# Patient Record
Sex: Female | Born: 1960 | Race: Black or African American | Hispanic: No | Marital: Single | Smoking: Current every day smoker
Health system: Southern US, Community
[De-identification: ages and names within clinical notes are randomized; demographics above are authoritative.]

## PROBLEM LIST (undated history)

## (undated) DIAGNOSIS — R7303 Prediabetes: Secondary | ICD-10-CM

## (undated) DIAGNOSIS — D649 Anemia, unspecified: Secondary | ICD-10-CM

## (undated) DIAGNOSIS — N189 Chronic kidney disease, unspecified: Secondary | ICD-10-CM

## (undated) DIAGNOSIS — E785 Hyperlipidemia, unspecified: Secondary | ICD-10-CM

## (undated) DIAGNOSIS — I10 Essential (primary) hypertension: Secondary | ICD-10-CM

## (undated) DIAGNOSIS — I739 Peripheral vascular disease, unspecified: Secondary | ICD-10-CM

## (undated) DIAGNOSIS — R519 Headache, unspecified: Secondary | ICD-10-CM

## (undated) HISTORY — DX: Hyperlipidemia, unspecified: E78.5

## (undated) HISTORY — DX: Essential (primary) hypertension: I10

## (undated) HISTORY — PX: BREAST EXCISIONAL BIOPSY: SUR124

## (undated) HISTORY — DX: Anemia, unspecified: D64.9

## (undated) HISTORY — DX: Peripheral vascular disease, unspecified: I73.9

## (undated) HISTORY — PX: COLONOSCOPY: SHX174

---

## 2018-04-10 ENCOUNTER — Emergency Department (HOSPITAL_COMMUNITY)
Admission: EM | Admit: 2018-04-10 | Discharge: 2018-04-10 | Disposition: A | Discharge status: Home / Self Care | Payer: Medicaid Other | Attending: Emergency Medicine | Admitting: Emergency Medicine

## 2018-04-10 ENCOUNTER — Other Ambulatory Visit: Payer: Self-pay

## 2018-04-10 ENCOUNTER — Encounter (HOSPITAL_COMMUNITY): Payer: Self-pay

## 2018-04-10 DIAGNOSIS — I739 Peripheral vascular disease, unspecified: Secondary | ICD-10-CM | POA: Diagnosis not present

## 2018-04-10 DIAGNOSIS — F17200 Nicotine dependence, unspecified, uncomplicated: Secondary | ICD-10-CM | POA: Insufficient documentation

## 2018-04-10 DIAGNOSIS — M79605 Pain in left leg: Secondary | ICD-10-CM | POA: Diagnosis present

## 2018-04-10 DIAGNOSIS — I10 Essential (primary) hypertension: Secondary | ICD-10-CM | POA: Diagnosis not present

## 2018-04-10 LAB — CBC
HEMATOCRIT: 43.7 % (ref 36.0–46.0)
Hemoglobin: 13.9 g/dL (ref 12.0–15.0)
MCH: 31.1 pg (ref 26.0–34.0)
MCHC: 31.8 g/dL (ref 30.0–36.0)
MCV: 97.8 fL (ref 80.0–100.0)
Platelets: 232 10*3/uL (ref 150–400)
RBC: 4.47 MIL/uL (ref 3.87–5.11)
RDW: 13.7 % (ref 11.5–15.5)
WBC: 6 10*3/uL (ref 4.0–10.5)
nRBC: 0 % (ref 0.0–0.2)

## 2018-04-10 LAB — BASIC METABOLIC PANEL
Anion gap: 7 (ref 5–15)
BUN: 10 mg/dL (ref 6–20)
CO2: 29 mmol/L (ref 22–32)
Calcium: 9.4 mg/dL (ref 8.9–10.3)
Chloride: 103 mmol/L (ref 98–111)
Creatinine, Ser: 0.82 mg/dL (ref 0.44–1.00)
GFR calc Af Amer: >60 mL/min (ref >60)
GFR calc non Af Amer: >60 mL/min (ref >60)
Glucose, Bld: 97 mg/dL (ref 70–99)
Potassium: 4.2 mmol/L (ref 3.5–5.1)
Sodium: 139 mmol/L (ref 135–145)

## 2018-04-10 MED ORDER — AMLODIPINE BESYLATE 10 MG PO TABS: 10.0000 mg | ORAL_TABLET | Freq: Every day | ORAL | 1 refills | Status: DC

## 2018-04-10 MED ORDER — CILOSTAZOL 100 MG PO TABS: 100.0000 mg | ORAL_TABLET | Freq: Two times a day (BID) | ORAL | 1 refills | Status: DC

## 2018-04-10 NOTE — Discharge Instructions (Addendum)
The pain in your calf with walking is consistent with claudication.  And as you were told in the past you knew that you had some narrowing in the femoral iliac area on the left leg.  Make an appointment now that you are in this area to follow-up with vascular surgery here.  Return for any pain that is persistent at rest.  Or for any new or worse symptoms.  We have renewed your medications.  Take as directed.  Also given referral to wellness clinic where you can have your blood pressure followed up.

## 2018-04-10 NOTE — ED Notes (Signed)
Patient verbalizes understanding of discharge instructions. Opportunity for questioning and answers were provided. Armband removed by staff, pt discharged from ED.  

## 2018-04-10 NOTE — ED Triage Notes (Signed)
Pt states left leg pain X4 months. States she the pain is significantly worse after being on the leg working. AMbulatory.

## 2018-04-10 NOTE — ED Notes (Signed)
Pt ambulated to restroom with no assistance. ?

## 2018-04-10 NOTE — ED Provider Notes (Signed)
[facility] EMERGENCY DEPARTMENT Provider Note   CSN: [id number] Arrival date & time: 04/10/18  1112    History   Chief Complaint Chief Complaint  Patient presents with  . Leg Pain    HPI Brooke Chapman is a 58 y.o. female.     Patient recently moved here from the [place] area about 4 months ago.  Patient's been experiencing increased pain in her left calf with walking.  Has no rest pain.  Patient was told up in the [place] area a while back that she has a narrowing of the blood flow into her left leg up in the groin area.  Patient states the pain always resolves with rest.  No chest pain no shortness of breath.     History reviewed. No pertinent past medical history.  There are no active problems to display for this patient.   History reviewed. No pertinent surgical history.   OB History   No obstetric history on file.      Home Medications    Prior to Admission medications   Medication Sig Start Date End Date Taking? Authorizing Provider  amLODipine (NORVASC) 10 MG tablet Take 1 tablet (10 mg total) by mouth daily. 04/10/18   Fredia Sorrow, MD  cilostazol (PLETAL) 100 MG tablet Take 1 tablet (100 mg total) by mouth 2 (two) times daily. 04/10/18   Fredia Sorrow, MD    Family History History reviewed. No pertinent family history.  Social History Social History   Tobacco Use  . Smoking status: Current Every Day Smoker    Packs/day: 1.00  . Smokeless tobacco: Never Used  Substance Use Topics  . Alcohol use: Yes    Comment: rare  . Drug use: Never     Allergies   Patient has no known allergies.   Review of Systems Review of Systems  Constitutional: Negative for chills and fever.  HENT: Negative for rhinorrhea and sore throat.   Eyes: Negative for visual disturbance.  Respiratory: Negative for cough and shortness of breath.   Cardiovascular: Negative for chest pain and leg swelling.  Gastrointestinal: Negative for abdominal  pain, diarrhea, nausea and vomiting.  Genitourinary: Negative for dysuria.  Musculoskeletal: Negative for back pain and neck pain.  Skin: Negative for rash and wound.  Neurological: Negative for dizziness, light-headedness and headaches.  Hematological: Does not bruise/bleed easily.  Psychiatric/Behavioral: Negative for confusion.     Physical Exam Updated Vital Signs BP (!) 139/100   Pulse 76   Temp 98.4 F (36.9 C) (Oral)   Resp 17   SpO2 100%   Physical Exam Vitals signs and nursing note reviewed.  Constitutional:      General: She is not in acute distress.    Appearance: She is well-developed.  HENT:     Head: Normocephalic and atraumatic.     Mouth/Throat:     Mouth: Mucous membranes are moist.  Eyes:     Extraocular Movements: Extraocular movements intact.     Conjunctiva/sclera: Conjunctivae normal.     Pupils: Pupils are equal, round, and reactive to light.  Neck:     Musculoskeletal: Normal range of motion and neck supple.  Cardiovascular:     Rate and Rhythm: Normal rate and regular rhythm.     Heart sounds: No murmur.  Pulmonary:     Effort: Pulmonary effort is normal. No respiratory distress.     Breath sounds: Normal breath sounds.  Abdominal:     General: Bowel sounds are normal.  Palpations: Abdomen is soft.     Tenderness: There is no abdominal tenderness.  Musculoskeletal:        General: No swelling or tenderness.     Right lower leg: No edema.     Left lower leg: No edema.     Comments: Normal pulse on the left side is 2+.  Dorsalis pedis pulse and posterior tib pulse weak at about 1+.  But palpable.  Good cap refill of the toes.  Foot is warm.  No evidence of any ischemia to the toes.  Leg is warm as well.  Skin:    General: Skin is warm and dry.     Capillary Refill: Capillary refill takes less than 2 seconds.  Neurological:     General: No focal deficit present.     Mental Status: She is alert and oriented to person, place, and time.       ED Treatments / Results  Labs (all labs ordered are listed, but only abnormal results are displayed) Labs Reviewed  BASIC METABOLIC PANEL  CBC   Results for orders placed or performed during the hospital encounter of 16/01/09  Basic metabolic panel  Result Value Ref Range   Sodium 139 135 - 145 mmol/L   Potassium 4.2 3.5 - 5.1 mmol/L   Chloride 103 98 - 111 mmol/L   CO2 29 22 - 32 mmol/L   Glucose, Bld 97 70 - 99 mg/dL   BUN 10 6 - 20 mg/dL   Creatinine, Ser 0.82 0.44 - 1.00 mg/dL   Calcium 9.4 8.9 - 10.3 mg/dL   GFR calc non Af Amer >60 >60 mL/min   GFR calc Af Amer >60 >60 mL/min   Anion gap 7 5 - 15  CBC  Result Value Ref Range   WBC 6.0 4.0 - 10.5 K/uL   RBC 4.47 3.87 - 5.11 MIL/uL   Hemoglobin 13.9 12.0 - 15.0 g/dL   HCT 43.7 36.0 - 46.0 %   MCV 97.8 80.0 - 100.0 fL   MCH 31.1 26.0 - 34.0 pg   MCHC 31.8 30.0 - 36.0 g/dL   RDW 13.7 11.5 - 15.5 %   Platelets 232 150 - 400 K/uL   nRBC 0.0 0.0 - 0.2 %    EKG None  Radiology No results found.  Procedures Procedures (including critical care time)  Medications Ordered in ED Medications - No data to display   Initial Impression / Assessment and Plan / ED Course  I have reviewed the triage vital signs and the nursing notes.  Pertinent labs & imaging results that were available during my care of the patient were reviewed by me and considered in my medical decision making (see chart for details).       Patient symptoms in the left calf with walking are consistent with claudication.  Patient with a good strong femoral pulse here.  Week dorsalis pedis pulse.  Good cap refill to toes.  Foot is warm.  No significant swelling in the calf.  No pain at rest.  Pain always resolves when she is able to rest the leg.  Patient is from the [place] area has been down here about 4 months.  Needs to be plugged into the vascular clinic here.  Patient also has hypertension has not been on medications for that normally she is  on amlodipine.  We will renew her antiplatelet medication as well as her hypertensive meds.  Will refer her to the wellness clinic in the meantime for  blood pressure control.  Patient here today nontoxic no acute distress.  Final Clinical Impressions(s) / ED Diagnoses   Final diagnoses:  Claudication [facility])  Essential hypertension    ED Discharge Orders         Ordered    cilostazol (PLETAL) 100 MG tablet  2 times daily     04/10/18 1517    amLODipine (NORVASC) 10 MG tablet  Daily     04/10/18 1517           Fredia Sorrow, MD 04/10/18 1527

## 2018-05-01 ENCOUNTER — Ambulatory Visit: Payer: Medicaid Other | Attending: Critical Care Medicine | Admitting: Critical Care Medicine

## 2018-05-01 ENCOUNTER — Other Ambulatory Visit: Payer: Self-pay

## 2018-05-01 ENCOUNTER — Encounter: Payer: Self-pay | Admitting: Critical Care Medicine

## 2018-05-01 VITALS — BP 137/91 | HR 107 | Temp 98.1°F | Wt 148.0 lb

## 2018-05-01 DIAGNOSIS — Z1239 Encounter for other screening for malignant neoplasm of breast: Secondary | ICD-10-CM | POA: Diagnosis not present

## 2018-05-01 DIAGNOSIS — F17210 Nicotine dependence, cigarettes, uncomplicated: Secondary | ICD-10-CM | POA: Diagnosis not present

## 2018-05-01 DIAGNOSIS — I10 Essential (primary) hypertension: Secondary | ICD-10-CM

## 2018-05-01 DIAGNOSIS — I739 Peripheral vascular disease, unspecified: Secondary | ICD-10-CM | POA: Diagnosis not present

## 2018-05-01 DIAGNOSIS — F17200 Nicotine dependence, unspecified, uncomplicated: Secondary | ICD-10-CM

## 2018-05-01 DIAGNOSIS — Z1159 Encounter for screening for other viral diseases: Secondary | ICD-10-CM | POA: Diagnosis not present

## 2018-05-01 HISTORY — DX: Nicotine dependence, unspecified, uncomplicated: F17.200

## 2018-05-01 HISTORY — DX: Essential (primary) hypertension: I10

## 2018-05-01 MED ORDER — AMLODIPINE BESYLATE 10 MG PO TABS: 10.0000 mg | ORAL_TABLET | Freq: Every day | ORAL | 1 refills | Status: DC

## 2018-05-01 MED ORDER — BUPROPION HCL ER (SR) 150 MG PO TB12: 150.0000 mg | ORAL_TABLET | Freq: Two times a day (BID) | ORAL | 1 refills | Status: DC

## 2018-05-01 MED ORDER — CILOSTAZOL 100 MG PO TABS: 100.0000 mg | ORAL_TABLET | Freq: Two times a day (BID) | ORAL | 1 refills | Status: DC

## 2018-05-01 NOTE — Assessment & Plan Note (Signed)
Hypertension that is reasonably well-controlled at this time  Will obtain a thyroid panel, complete metabolic panel and complete blood count  Note EKG at this visit was normal

## 2018-05-01 NOTE — Assessment & Plan Note (Signed)
Peripheral artery disease with previous work-up in [place] in [place] will be to obtain records from the patient's previous vascular surgeon and refer to our vascular surgeons for further evaluation  I spent considerable amount of time on smoking cessation at this visit

## 2018-05-01 NOTE — Assessment & Plan Note (Addendum)
10 minutes of time were spent on smoking cessation counseling, and I recommended onset of bupropion 150 mg twice daily to assist with the smoking cessation counseling

## 2018-05-01 NOTE — Progress Notes (Signed)
Subjective:    Patient ID: Brooke Chapman, female    DOB: 09-08-1960, 58 y.o.   MRN: [id number]  This is a 58 year old female referred from the emergency room and is a new patient to the practice.  The patient's had claudication in the left leg for 10 years.  It is getting progressively worse.  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  She apparently had vascular studies done and was last seen 6 to 8 months ago and had obstruction in the upper and lower circulation of the left leg.  We will obtain records from her vascular surgeon.  Since arriving in [place] she has not yet established with a primary care provider  There is a history of hypertension but no history of diabetes.  She had a negative HIV study in 2017.  She had a colonoscopy done in 2019.  Her Pap smear was 2019.  She is due a tetanus vaccine and mammogram and hepatitis C study   name of MD.Dr Ian Bushman  [phone]   [address]  [address]  [place] [address] She complains of some pain under the right arm but there is no chest pain she occasionally has headaches as well.  There is no shortness of breath.  No known history of coronary disease.  The patient does continue to smoke 1 pack a day of cigarettes  Past Medical History:  Diagnosis Date  . Hypertension   . PAD (peripheral artery disease) (HCC)      No family history on file.   Social History   Socioeconomic History  . Marital status: Single    Spouse name: Not on file  . Number of children: Not on file  . Years of education: Not on file  . Highest education level: Not on file  Occupational History  . Not on file  Social Needs  . Financial resource strain: Not on file  . Food insecurity:    Worry: Not on file    Inability: Not on file  . Transportation needs:    Medical: Not on file    Non-medical: Not on file  Tobacco Use  . Smoking status: Current Every Day  Smoker    Packs/day: 1.00  . Smokeless tobacco: Never Used  Substance and Sexual Activity  . Alcohol use: Yes    Comment: rare  . Drug use: Never  . Sexual activity: Not on file  Lifestyle  . Physical activity:    Days per week: Not on file    Minutes per session: Not on file  . Stress: Not on file  Relationships  . Social connections:    Talks on phone: Not on file    Gets together: Not on file    Attends religious service: Not on file    Active member of club or organization: Not on file    Attends meetings of clubs or organizations: Not on file    Relationship status: Not on file  . Intimate partner violence:    Fear of current or ex partner: Not on file    Emotionally abused: Not on file    Physically abused: Not on file    Forced sexual activity: Not on file  Other Topics Concern  . Not on file  Social History Narrative  . Not on file     No Known Allergies   Outpatient Medications Prior to  Visit  Medication Sig Dispense Refill  . aspirin 81 MG chewable tablet Chew 81 mg by mouth daily.    . cholecalciferol (VITAMIN D3) 25 MCG (1000 UT) tablet Take 1,000 Units by mouth daily.    Marland Kitchen amLODipine (NORVASC) 10 MG tablet Take 1 tablet (10 mg total) by mouth daily. 30 tablet 1  . cilostazol (PLETAL) 100 MG tablet Take 1 tablet (100 mg total) by mouth 2 (two) times daily. 60 tablet 1   No facility-administered medications prior to visit.      Review of Systems     Objective:   Physical Exam Vitals:   05/01/18 1443  BP: (!) 137/91  Pulse: (!) 107  Temp: 98.1 F (36.7 C)  TempSrc: Oral  SpO2: 98%  Weight: 148 lb (67.1 kg)    Gen: Pleasant, well-nourished, in no distress,  normal affect  ENT: No lesions,  mouth clear,  oropharynx clear, no postnasal drip  Neck: No JVD, no TMG, no carotid bruits  Lungs: No use of accessory muscles, no dullness to percussion, clear without rales or rhonchi  Cardiovascular: RRR, heart sounds normal, no murmur or gallops, no  peripheral edema, there is decreased pulses in the left lower extremity  Abdomen: soft and NT, no HSM,  BS normal  Musculoskeletal: No deformities, no cyanosis or clubbing  Neuro: alert, non focal  Skin: Warm, no lesions or rashes      Assessment & Plan:  I personally reviewed all images and lab data in the [facility] system as well as any outside material available during this office visit and agree with the  radiology impressions.   No problem-specific Assessment & Plan notes found for this encounter.   Zonie was seen today for hospitalization follow-up.  Diagnoses and all orders for this visit:  PAD (peripheral artery disease) ([facility]) -     Ambulatory referral to Vascular Surgery -     CBC with Differential/Platelet; Future -     Comprehensive metabolic panel -     CBC with Differential/Platelet  Essential hypertension -     CBC with Differential/Platelet; Future -     Comprehensive metabolic panel -     Thyroid Profile -     Lipid panel -     CBC with Differential/Platelet  Need for hepatitis C screening test -     Hepatitis C antibody  Breast cancer screening -     MM DIGITAL SCREENING BILATERAL; Future  Tobacco dependence  Other orders -     cilostazol (PLETAL) 100 MG tablet; Take 1 tablet (100 mg total) by mouth 2 (two) times daily. -     amLODipine (NORVASC) 10 MG tablet; Take 1 tablet (10 mg total) by mouth daily. -     buPROPion (WELLBUTRIN SR) 150 MG 12 hr tablet; Take 1 tablet (150 mg total) by mouth 2 (two) times daily.   I spent 10 minutes of this visit was smoking cessation counseling  Also the patient was given a tetanus vaccine and a mammogram will be scheduled  Also a hepatitis C study will be obtained

## 2018-05-01 NOTE — Patient Instructions (Addendum)
We will obtain your outpatient records from your vascular surgeon in [place] on your blood pressure medicine and Pletal will be mailed to your home without change in dose  I recommend beginning bupropion or Wellbutrin 1 tablet twice daily to pursue smoking cessation  Labs today will include a complete metabolic panel, blood count, lipid panel, thyroid panel, hepatitis C panel  We will also have you scheduled for mammogram  A tetanus vaccine was given today in the office  I will schedule a telephone visit with you in 1 month

## 2018-05-02 ENCOUNTER — Telehealth: Payer: Self-pay | Admitting: Critical Care Medicine

## 2018-05-02 DIAGNOSIS — E785 Hyperlipidemia, unspecified: Secondary | ICD-10-CM | POA: Insufficient documentation

## 2018-05-02 DIAGNOSIS — E7800 Pure hypercholesterolemia, unspecified: Secondary | ICD-10-CM

## 2018-05-02 LAB — COMPREHENSIVE METABOLIC PANEL
ALT: 10 IU/L (ref 0–32)
AST: 22 IU/L (ref 0–40)
Albumin/Globulin Ratio: 1.4 (ref 1.2–2.2)
Albumin: 4.3 g/dL (ref 3.8–4.9)
Alkaline Phosphatase: 71 IU/L (ref 39–117)
BUN/Creatinine Ratio: 16 (ref 12–28)
BUN: 13 mg/dL (ref 8–27)
Bilirubin Total: 0.2 mg/dL (ref 0.0–1.2)
CO2: 25 mmol/L (ref 20–29)
Calcium: 9.6 mg/dL (ref 8.7–10.3)
Chloride: 101 mmol/L (ref 96–106)
Creatinine, Ser: 0.81 mg/dL (ref 0.57–1.00)
GFR calc Af Amer: 91 mL/min/{1.73_m2} (ref >59)
GFR calc non Af Amer: 79 mL/min/{1.73_m2} (ref >59)
Globulin, Total: 3.1 g/dL (ref 1.5–4.5)
Glucose: 84 mg/dL (ref 65–99)
Potassium: 4.1 mmol/L (ref 3.5–5.2)
Sodium: 143 mmol/L (ref 134–144)
Total Protein: 7.4 g/dL (ref 6.0–8.5)

## 2018-05-02 LAB — LIPID PANEL
Chol/HDL Ratio: 3.5 ratio (ref 0.0–4.4)
Cholesterol, Total: 230 mg/dL — ABNORMAL HIGH (ref 100–199)
HDL: 66 mg/dL (ref >39)
LDL Calculated: 148 mg/dL — ABNORMAL HIGH (ref 0–99)
Triglycerides: 79 mg/dL (ref 0–149)
VLDL Cholesterol Cal: 16 mg/dL (ref 5–40)

## 2018-05-02 LAB — CBC WITH DIFFERENTIAL/PLATELET
Basophils Absolute: 0 10*3/uL (ref 0.0–0.2)
Basos: 1 %
EOS (ABSOLUTE): 0 10*3/uL (ref 0.0–0.4)
Eos: 0 %
Hematocrit: 38.7 % (ref 34.0–46.6)
Hemoglobin: 13.7 g/dL (ref 11.1–15.9)
Immature Grans (Abs): 0 10*3/uL (ref 0.0–0.1)
Immature Granulocytes: 0 %
Lymphocytes Absolute: 3.6 10*3/uL — ABNORMAL HIGH (ref 0.7–3.1)
Lymphs: 55 %
MCH: 31.8 pg (ref 26.6–33.0)
MCHC: 35.4 g/dL (ref 31.5–35.7)
MCV: 90 fL (ref 79–97)
Monocytes Absolute: 0.4 10*3/uL (ref 0.1–0.9)
Monocytes: 6 %
Neutrophils Absolute: 2.5 10*3/uL (ref 1.4–7.0)
Neutrophils: 38 %
Platelets: 293 10*3/uL (ref 150–450)
RBC: 4.31 x10E6/uL (ref 3.77–5.28)
RDW: 12.8 % (ref 11.7–15.4)
WBC: 6.5 10*3/uL (ref 3.4–10.8)

## 2018-05-02 LAB — THYROID PANEL
Free Thyroxine Index: 2.1 (ref 1.2–4.9)
T3 Uptake Ratio: 26 % (ref 24–39)
T4, Total: 8.1 ug/dL (ref 4.5–12.0)

## 2018-05-02 LAB — HEPATITIS C ANTIBODY: Hep C Virus Ab: <0.1 s/co ratio (ref 0.0–0.9)

## 2018-05-02 MED ORDER — ATORVASTATIN CALCIUM 20 MG PO TABS: 20.0000 mg | ORAL_TABLET | Freq: Every day | ORAL | 3 refills | Status: DC

## 2018-05-02 NOTE — Telephone Encounter (Signed)
Pt aware of results  Needs atorvastin for HLP.

## 2018-05-07 MED FILL — BUPROPION SR 150 MG TABLET: 150 | 30 days supply | Qty: 60 | Fill #0 | Status: TO

## 2018-05-15 ENCOUNTER — Ambulatory Visit: Payer: Self-pay | Admitting: Critical Care Medicine

## 2018-07-07 ENCOUNTER — Other Ambulatory Visit: Payer: Self-pay

## 2018-07-07 ENCOUNTER — Ambulatory Visit
Admission: RE | Admit: 2018-07-07 | Discharge: 2018-07-07 | Disposition: A | Discharge status: Home / Self Care | Payer: Medicaid Other | Source: Ambulatory Visit | Attending: Critical Care Medicine | Admitting: Critical Care Medicine

## 2018-07-07 DIAGNOSIS — Z1239 Encounter for other screening for malignant neoplasm of breast: Secondary | ICD-10-CM

## 2018-07-09 ENCOUNTER — Other Ambulatory Visit: Payer: Self-pay

## 2018-07-09 DIAGNOSIS — I739 Peripheral vascular disease, unspecified: Secondary | ICD-10-CM

## 2018-07-10 ENCOUNTER — Telehealth: Payer: Self-pay | Admitting: *Deleted

## 2018-07-10 NOTE — Telephone Encounter (Signed)
Left message on voice mail  to call back

## 2018-07-10 NOTE — Telephone Encounter (Signed)
-----   Message from Elsie Stain, MD sent at 07/08/2018  8:28 AM EDT ----- Carilyn Goodpasture,  Can you let ms Wynetta Emery know her mammogram was normal  No cancer.  Will arrange repeat exam in ONE year

## 2018-07-18 ENCOUNTER — Other Ambulatory Visit: Payer: Self-pay

## 2018-07-18 ENCOUNTER — Encounter: Payer: Self-pay | Admitting: Vascular Surgery

## 2018-07-18 ENCOUNTER — Ambulatory Visit (INDEPENDENT_AMBULATORY_CARE_PROVIDER_SITE_OTHER): Payer: Medicaid Other | Admitting: Vascular Surgery

## 2018-07-18 ENCOUNTER — Ambulatory Visit (HOSPITAL_COMMUNITY)
Admission: RE | Admit: 2018-07-18 | Discharge: 2018-07-18 | Disposition: A | Discharge status: Home / Self Care | Payer: Medicaid Other | Source: Ambulatory Visit | Attending: Vascular Surgery | Admitting: Vascular Surgery

## 2018-07-18 VITALS — BP 148/92 | HR 94 | Temp 97.8°F | Resp 20 | Ht 64.0 in | Wt 166.0 lb

## 2018-07-18 DIAGNOSIS — Z7689 Persons encountering health services in other specified circumstances: Secondary | ICD-10-CM | POA: Diagnosis not present

## 2018-07-18 DIAGNOSIS — I739 Peripheral vascular disease, unspecified: Secondary | ICD-10-CM | POA: Insufficient documentation

## 2018-07-18 NOTE — Progress Notes (Signed)
Patient ID: Brooke Chapman, female   DOB: January 07, 1961, 58 y.o.   MRN: [id number]  Reason for Consult: New Patient (Initial Visit)   Referred by Elsie Stain, MD  Subjective:     HPI:  Brooke Chapman is a 58 y.o. female with possible previous angiography of her bilateral lower extremities performed in [place].  She has never had any tissue loss or ulceration.  She states that she is very short distance claudicant on the left approximately 30 to 40 yards and can go longer on the right leg.  She does not have any tissue loss or ulceration but does have some painful calluses on her feet.  Risk factors include hyperlipidemia and hypertension and chronic ongoing smoking currently 1 pack/day.  Does not have any heart disease is never had stroke TIA or amaurosis.  She does take aspirin currently as prescribed statin was not taking.  Past Medical History:  Diagnosis Date  . Hyperlipidemia   . Hypertension   . PAD (peripheral artery disease) (HCC)    No family history on file. Past Surgical History:  Procedure Laterality Date  . BREAST EXCISIONAL BIOPSY Left    LESS THAN 5 YEARS AGO    Short Social History:  Social History   Tobacco Use  . Smoking status: Current Every Day Smoker    Packs/day: 1.00  . Smokeless tobacco: Never Used  Substance Use Topics  . Alcohol use: Yes    Comment: rare    No Known Allergies  Current Outpatient Medications  Medication Sig Dispense Refill  . amLODipine (NORVASC) 10 MG tablet Take 1 tablet (10 mg total) by mouth daily. 90 tablet 1  . aspirin 81 MG chewable tablet Chew 81 mg by mouth daily.    . cholecalciferol (VITAMIN D3) 25 MCG (1000 UT) tablet Take 1,000 Units by mouth daily.    . cilostazol (PLETAL) 100 MG tablet Take 1 tablet (100 mg total) by mouth 2 (two) times daily. 180 tablet 1  . atorvastatin (LIPITOR) 20 MG tablet Take 1 tablet (20 mg total) by mouth daily. (Patient not taking: Reported on 07/18/2018) 90 tablet 3  . buPROPion  (WELLBUTRIN SR) 150 MG 12 hr tablet Take 1 tablet (150 mg total) by mouth 2 (two) times daily. (Patient not taking: Reported on 07/18/2018) 60 tablet 1   No current facility-administered medications for this visit.     Review of Systems  Constitutional:  Constitutional negative. HENT: HENT negative.  Eyes: Eyes negative.  Cardiovascular: Positive for claudication.  GI: Gastrointestinal negative.  Musculoskeletal: Musculoskeletal negative.  Skin: Skin negative.  Neurological: Neurological negative. Hematologic: Hematologic/lymphatic negative.  Psychiatric: Psychiatric negative.        Objective:  Objective   Vitals:   07/18/18 1007  BP: (!) 148/92  Pulse: 94  Resp: 20  Temp: 97.8 F (36.6 C)  SpO2: 98%  Weight: 166 lb (75.3 kg)  Height: 5\' 4"  (1.626 m)   Body mass index is 28.49 kg/m.  Physical Exam HENT:     Head: Normocephalic.     Nose: Nose normal.     Mouth/Throat:     Mouth: Mucous membranes are dry.  Eyes:     Pupils: Pupils are equal, round, and reactive to light.  Neck:     Musculoskeletal: Normal range of motion and neck supple.  Cardiovascular:     Rate and Rhythm: Normal rate.     Pulses:          Femoral pulses are 2+ on  the right side and 2+ on the left side.      Popliteal pulses are 0 on the right side and 0 on the left side.  Abdominal:     General: Abdomen is flat.     Palpations: Abdomen is soft.  Musculoskeletal: Normal range of motion.        General: No swelling.  Skin:    General: Skin is warm and dry.  Neurological:     General: No focal deficit present.     Mental Status: She is alert.  Psychiatric:        Mood and Affect: Mood normal.        Behavior: Behavior normal.        Thought Content: Thought content normal.        Judgment: Judgment normal.     Data: I have independently interpreted her ABIs to be right-sided 0.57 monophasic in left side 0.55 and monophasic     Assessment/Plan:     58 year old female with  history possibly of angiogram and [place] prior to moving here.  His ABIs in the 0.5 range and very short distance claudication left greater than right.  Does not have tissue loss and ulceration in her feet appeared healthy at this time.  Given that she continues to smoke I would not offer her any procedures at this time.  I discussed with her smoking cessation for greater than 5 minutes.  We discussed protecting her feet.  She will follow-up in 6 months with repeat ABIs.  Hopefully at that time she will stop smoking we can consider angiography with possible intervention to improve her walking if it is necessary at that time.  She will start taking statin as prescribed she already takes aspirin.     Waynetta Sandy MD [facility]

## 2018-07-20 ENCOUNTER — Telehealth: Payer: Self-pay | Admitting: Critical Care Medicine

## 2018-07-20 ENCOUNTER — Other Ambulatory Visit: Payer: Self-pay | Admitting: Critical Care Medicine

## 2018-07-20 MED ORDER — ATORVASTATIN CALCIUM 20 MG PO TABS: 20.0000 mg | ORAL_TABLET | Freq: Every day | ORAL | 3 refills | Status: DC

## 2018-07-20 NOTE — Progress Notes (Signed)
atorvastatinn called into pt pharmacy

## 2018-07-20 NOTE — Telephone Encounter (Signed)
[place],  This pt called my cell to tell me she needed her atorvastatin refilled.  I had sent in April this med to her [facility] and she did not pick up  Tell her I resent the Rx to her [facility] today

## 2018-07-21 NOTE — Telephone Encounter (Signed)
Medical Assistant left message on patient's home and cell voicemail. Voicemail states to give a call back to [place] with [facility] at [phone]. Patient is aware of cholesterol medication being filled at the [facility] for pickup.

## 2018-07-22 ENCOUNTER — Ambulatory Visit: Payer: Medicaid Other | Admitting: Podiatry

## 2018-07-28 ENCOUNTER — Telehealth (HOSPITAL_BASED_OUTPATIENT_CLINIC_OR_DEPARTMENT_OTHER): Payer: Medicaid Other | Admitting: Internal Medicine

## 2018-07-28 ENCOUNTER — Encounter: Payer: Self-pay | Admitting: Internal Medicine

## 2018-07-28 DIAGNOSIS — F17200 Nicotine dependence, unspecified, uncomplicated: Secondary | ICD-10-CM

## 2018-07-28 DIAGNOSIS — I739 Peripheral vascular disease, unspecified: Secondary | ICD-10-CM | POA: Diagnosis not present

## 2018-07-28 DIAGNOSIS — I10 Essential (primary) hypertension: Secondary | ICD-10-CM | POA: Diagnosis not present

## 2018-07-28 DIAGNOSIS — E7800 Pure hypercholesterolemia, unspecified: Secondary | ICD-10-CM

## 2018-07-28 MED ORDER — CILOSTAZOL 100 MG PO TABS: 100.0000 mg | ORAL_TABLET | Freq: Two times a day (BID) | ORAL | 3 refills | Status: DC

## 2018-07-28 MED ORDER — BLOOD PRESSURE MONITOR DEVI: 0 refills | Status: DC

## 2018-07-28 MED ORDER — AMLODIPINE BESYLATE 10 MG PO TABS: 10.0000 mg | ORAL_TABLET | Freq: Every day | ORAL | 1 refills | Status: DC

## 2018-07-28 NOTE — Progress Notes (Signed)
Virtual Visit via Telephone Note Due to current restrictions/limitations of in-office visits due to the COVID-19 pandemic, this scheduled clinical appointment was converted to a telehealth visit  I connected with Brooke Chapman on 07/28/18 at 12:12 p.m by telephone and verified that I am speaking with the correct person using two identifiers. I am in my office.  The patient is at home.  Only the patient and myself participated in this encounter.  I discussed the limitations, risks, security and privacy concerns of performing an evaluation and management service by telephone and the availability of in person appointments. I also discussed with the patient that there may be a patient responsible charge related to this service. The patient expressed understanding and agreed to proceed.   History of Present Illness: Patient with history of PAD left leg, HTN, tobacco dependence, HL.  Patient is new to me.  She saw Dr. Joya Gaskins here at our facility back in April to establish care. Pt moved from [place] to [place] 110/2019  PAD:  Compliant with Pletal and ASA.   Endorses pain in legs with ambulation.  Can walk 15-20 feet before she has to stop Saw vascular surgeon Dr. Donzetta Matters 07/18/2018.  Plans to repeat ABI in 6 mths. She continues to smoke but is working on quitting.   Tob dep:  Smoke 1 pk/day for 30+ yr.  Just started the Buproprion today.  She is committed to trying to quit.   HTN:  Limits salt in foods. No device to check BP Reports compliance with amlodipine. Denies chest pains or shortness of breath.  HL:  Just start Lipitor and tolerating okay.  Observations/Objective: Results for orders placed or performed in visit on 05/01/18  Comprehensive metabolic panel  Result Value Ref Range   Glucose 84 65 - 99 mg/dL   BUN 13 8 - 27 mg/dL   Creatinine, Ser 0.81 0.57 - 1.00 mg/dL   GFR calc non Af Amer 79 >59 mL/min/1.73   GFR calc Af Amer 91 >59 mL/min/1.73   BUN/Creatinine Ratio 16 12 - 28    Sodium 143 134 - 144 mmol/L   Potassium 4.1 3.5 - 5.2 mmol/L   Chloride 101 96 - 106 mmol/L   CO2 25 20 - 29 mmol/L   Calcium 9.6 8.7 - 10.3 mg/dL   Total Protein 7.4 6.0 - 8.5 g/dL   Albumin 4.3 3.8 - 4.9 g/dL   Globulin, Total 3.1 1.5 - 4.5 g/dL   Albumin/Globulin Ratio 1.4 1.2 - 2.2   Bilirubin Total 0.2 0.0 - 1.2 mg/dL   Alkaline Phosphatase 71 39 - 117 IU/L   AST 22 0 - 40 IU/L   ALT 10 0 - 32 IU/L  Thyroid Profile  Result Value Ref Range   T4, Total 8.1 4.5 - 12.0 ug/dL   T3 Uptake Ratio 26 24 - 39 %   Free Thyroxine Index 2.1 1.2 - 4.9  Lipid panel  Result Value Ref Range   Cholesterol, Total 230 (H) 100 - 199 mg/dL   Triglycerides 79 0 - 149 mg/dL   HDL 66 >39 mg/dL   VLDL Cholesterol Cal 16 5 - 40 mg/dL   LDL Calculated 148 (H) 0 - 99 mg/dL   Chol/HDL Ratio 3.5 0.0 - 4.4 ratio  Hepatitis C antibody  Result Value Ref Range   Hep C Virus Ab <0.1 0.0 - 0.9 s/co ratio  CBC with Differential/Platelet  Result Value Ref Range   WBC 6.5 3.4 - 10.8 x10E3/uL   RBC  4.31 3.77 - 5.28 x10E6/uL   Hemoglobin 13.7 11.1 - 15.9 g/dL   Hematocrit 38.7 34.0 - 46.6 %   MCV 90 79 - 97 fL   MCH 31.8 26.6 - 33.0 pg   MCHC 35.4 31.5 - 35.7 g/dL   RDW 12.8 11.7 - 15.4 %   Platelets 293 150 - 450 x10E3/uL   Neutrophils 38 Not Estab. %   Lymphs 55 Not Estab. %   Monocytes 6 Not Estab. %   Eos 0 Not Estab. %   Basos 1 Not Estab. %   Neutrophils Absolute 2.5 1.4 - 7.0 x10E3/uL   Lymphocytes Absolute 3.6 (H) 0.7 - 3.1 x10E3/uL   Monocytes Absolute 0.4 0.1 - 0.9 x10E3/uL   EOS (ABSOLUTE) 0.0 0.0 - 0.4 x10E3/uL   Basophils Absolute 0.0 0.0 - 0.2 x10E3/uL   Immature Granulocytes 0 Not Estab. %   Immature Grans (Abs) 0.0 0.0 - 0.1 x10E3/uL     Assessment and Plan: 1. Essential hypertension Level of control unknown.  She has Medicaid which will pay for her to get a home blood pressure monitoring device.  She is agreeable for me to send prescription to her pharmacy.  Advised to check  blood pressure at least twice a week with goal being 130/80 or lower. - amLODipine (NORVASC) 10 MG tablet; Take 1 tablet (10 mg total) by mouth daily.  Dispense: 90 tablet; Refill: 1 - Blood Pressure Monitor DEVI; Use as directed to check home blood pressure 2-3 times a week  Dispense: 1 Device; Refill: 0  2. PAD (peripheral artery disease) ([place]) Strongly advised to discontinue smoking. Encouraged her to try to walk for 5 minutes and rest for 5 minutes with repeated cycles - cilostazol (PLETAL) 100 MG tablet; Take 1 tablet (100 mg total) by mouth 2 (two) times daily.  Dispense: 180 tablet; Refill: 3  3. Tobacco dependence Patient advised to quit smoking. Discussed health risks associated with smoking including lung and other types of cancers, chronic lung diseases and CV risks.. Pt ready to give trail of quitting.  Discussed methods to help quit including quitting cold [place], use of NRT, Chantix and Bupropion.  She is just started bupropion.  Encouraged her to set a quit date.  4. Pure hypercholesterolemia Continue atorvastatin   Follow Up Instructions: F/u in 3 mths   I discussed the assessment and treatment plan with the patient. The patient was provided an opportunity to ask questions and all were answered. The patient agreed with the plan and demonstrated an understanding of the instructions.   The patient was advised to call back or seek an in-person evaluation if the symptoms worsen or if the condition fails to improve as anticipated.  I provided 11 minutes of non-face-to-face time during this encounter.   Karle Plumber, MD

## 2018-07-28 NOTE — Progress Notes (Signed)
Patient verified DOB Patient has taken medication today. Patient has eaten today. Patient denies pain at this time. Patient complains of bilateral pain with exertion. Patient request refill on Cilostazol, Amlodipine.

## 2018-08-06 ENCOUNTER — Ambulatory Visit: Payer: Medicaid Other | Admitting: Podiatry

## 2018-08-26 ENCOUNTER — Other Ambulatory Visit: Payer: Self-pay

## 2018-08-26 ENCOUNTER — Encounter: Payer: Self-pay | Admitting: Podiatry

## 2018-08-26 ENCOUNTER — Ambulatory Visit (INDEPENDENT_AMBULATORY_CARE_PROVIDER_SITE_OTHER): Payer: Medicaid Other | Admitting: Podiatry

## 2018-08-26 DIAGNOSIS — Q828 Other specified congenital malformations of skin: Secondary | ICD-10-CM

## 2018-08-26 DIAGNOSIS — I739 Peripheral vascular disease, unspecified: Secondary | ICD-10-CM

## 2018-08-26 DIAGNOSIS — D689 Coagulation defect, unspecified: Secondary | ICD-10-CM

## 2018-08-26 DIAGNOSIS — Z7689 Persons encountering health services in other specified circumstances: Secondary | ICD-10-CM | POA: Diagnosis not present

## 2018-08-26 HISTORY — DX: Peripheral vascular disease, unspecified: I73.9

## 2018-08-26 HISTORY — DX: Other specified congenital malformations of skin: Q82.8

## 2018-08-26 NOTE — Progress Notes (Signed)
This patient present to the office  with chief complaint of callus developing on her right foot..  She says this callus has become painful walking and wearing her shoes.  Patient says she has self treated her callus for years and presents to the office for treatment and to discuss additional treatment. Patient has provided no  sought professional help.  She presents to the office for treatment of her painful callus.  Patient is taking pletal.    Vascular  Dorsalis pedis and posterior tibial pulses are not  palpable left foot.  Dorsalis pedis is palpable and posterior tibial pulse is not palpable..  Capillary return  WNL.  Cold feet..  Skin turgor  WNL  Sensorium  Senn Weinstein monofilament wire  Diminished.  . Normal tactile sensation.  Nail Exam  Patient has normal nails with no evidence of bacterial or fungal infection.  Orthopedic  Exam  Muscle tone and muscle strength  WNL.  No limitations of motion feet  B/L.  No crepitus or joint effusion noted.  Foot type is unremarkable and digits show no abnormalities.  Bony prominences are unremarkable.  Plantar flexed fifth metatarsal  B/L.HAV  B/L.  Skin  No open lesions.  Normal skin texture and turgor.  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  Porokeratosis/Callus right foot.    IE  Debride callus/porokeratosis.  Discussed condition with patient.  RTC prn.  Gardiner Barefoot DPM

## 2018-09-22 ENCOUNTER — Other Ambulatory Visit: Payer: Self-pay

## 2018-09-22 ENCOUNTER — Encounter (HOSPITAL_COMMUNITY): Payer: Self-pay

## 2018-09-22 ENCOUNTER — Emergency Department (HOSPITAL_COMMUNITY)
Admission: EM | Admit: 2018-09-22 | Discharge: 2018-09-22 | Disposition: A | Discharge status: Home / Self Care | Payer: Medicaid Other | Attending: Emergency Medicine | Admitting: Emergency Medicine

## 2018-09-22 DIAGNOSIS — M25512 Pain in left shoulder: Secondary | ICD-10-CM | POA: Insufficient documentation

## 2018-09-22 DIAGNOSIS — R0789 Other chest pain: Secondary | ICD-10-CM | POA: Diagnosis not present

## 2018-09-22 DIAGNOSIS — R52 Pain, unspecified: Secondary | ICD-10-CM | POA: Diagnosis not present

## 2018-09-22 DIAGNOSIS — I10 Essential (primary) hypertension: Secondary | ICD-10-CM | POA: Diagnosis not present

## 2018-09-22 DIAGNOSIS — Z79899 Other long term (current) drug therapy: Secondary | ICD-10-CM | POA: Diagnosis not present

## 2018-09-22 DIAGNOSIS — F17210 Nicotine dependence, cigarettes, uncomplicated: Secondary | ICD-10-CM | POA: Diagnosis not present

## 2018-09-22 DIAGNOSIS — Z7982 Long term (current) use of aspirin: Secondary | ICD-10-CM | POA: Insufficient documentation

## 2018-09-22 DIAGNOSIS — M25519 Pain in unspecified shoulder: Secondary | ICD-10-CM | POA: Diagnosis not present

## 2018-09-22 NOTE — Discharge Instructions (Addendum)
Please read attached information. If you experience any new or worsening signs or symptoms please return to the emergency room for evaluation. Please follow-up with your primary care provider or specialist as discussed.  Please use Tylenol or ibuprofen as needed for discomfort.

## 2018-09-22 NOTE — ED Notes (Signed)
EDP [facility] made aware of pt d/c vs.

## 2018-09-22 NOTE — ED Triage Notes (Signed)
[facility] reports pt from home with pain in left shoulder x2 days when moving, denies falling or injury. No deformities. 150/110 100hr  100%  98.2 T

## 2018-09-22 NOTE — ED Notes (Signed)
Patient verbalizes understanding of discharge instructions. Opportunity for questioning and answers were provided. Armband removed by staff, pt discharged from ED.  

## 2018-09-22 NOTE — ED Provider Notes (Signed)
[facility] EMERGENCY DEPARTMENT Provider Note   CSN: [id number] Arrival date & time: 09/22/18  1120     History   Chief Complaint Chief Complaint  Patient presents with  . Shoulder Pain    HPI Brooke Chapman is a 58 y.o. female.     HPI  58 year old female presents today with complaints of left shoulder pain.  Patient notes 3-day history of exquisite tenderness palpation of her left shoulder.  She notes she has difficulty sleeping on the side.  She denies any chest pain but notes some soreness in the anterior chest wall.  She denies any fever, no history of gout or trauma.  She notes that occasionally she has pain on the right shoulder but now this is her left bothering her.  No medications prior to arrival.  Past Medical History:  Diagnosis Date  . Hyperlipidemia   . Hypertension   . PAD (peripheral artery disease) [facility])     Patient Active Problem List   Diagnosis Date Noted  . 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 Coagulation disorder ([place]) 08/26/2018  . PVD (peripheral vascular disease) ([place]) 08/26/2018  . Hyperlipidemia 05/02/2018  . HTN (hypertension) 05/01/2018  . Tobacco dependence 05/01/2018  . PAD (peripheral artery disease) ([place])     Past Surgical History:  Procedure Laterality Date  . BREAST EXCISIONAL BIOPSY Left    LESS THAN 5 YEARS AGO     OB History   No obstetric history on file.      Home Medications    Prior to Admission medications   Medication Sig Start Date End Date Taking? Authorizing Provider  amLODipine (NORVASC) 10 MG tablet Take 1 tablet (10 mg total) by mouth daily. 07/28/18   Ladell Pier, MD  aspirin 81 MG chewable tablet Chew 81 mg by mouth daily.    [provider]  atorvastatin (LIPITOR) 20 MG tablet Take 1 tablet (20 mg total) by mouth daily. 07/20/18   Elsie Stain, MD  Blood Pressure Monitor DEVI Use as directed to check home blood pressure 2-3 times a week 07/28/18   Ladell Pier, MD   buPROPion [facility] SR) 150 MG 12 hr tablet Take 1 tablet (150 mg total) by mouth 2 (two) times daily. 05/01/18   Elsie Stain, MD  cholecalciferol (VITAMIN D3) 25 MCG (1000 UT) tablet Take 1,000 Units by mouth daily.    [provider]  cilostazol (PLETAL) 100 MG tablet Take 1 tablet (100 mg total) by mouth 2 (two) times daily. 07/28/18   Ladell Pier, MD    Family History History reviewed. No pertinent family history.  Social History Social History   Tobacco Use  . Smoking status: Current Every Day Smoker    Packs/day: 1.00  . Smokeless tobacco: Never Used  Substance Use Topics  . Alcohol use: Yes    Comment: rare  . Drug use: Never     Allergies   Patient has no known allergies.   Review of Systems Review of Systems  All other systems reviewed and are negative.    Physical Exam Updated Vital Signs BP (!) 144/109 (BP Location: Right Arm)   Pulse (!) 104   Temp 98.5 F (36.9 C) (Oral)   Resp 16   SpO2 99%   Physical Exam Vitals signs and nursing note reviewed.  Constitutional:      Appearance: She is well-developed.  HENT:     Head: Normocephalic and atraumatic.  Eyes:     General: No scleral  icterus.       Right eye: No discharge.        Left eye: No discharge.     Conjunctiva/sclera: Conjunctivae normal.     Pupils: Pupils are equal, round, and reactive to light.  Neck:     Musculoskeletal: Normal range of motion.     Vascular: No JVD.     Trachea: No tracheal deviation.  Pulmonary:     Effort: Pulmonary effort is normal.     Breath sounds: No stridor.  Musculoskeletal:     Comments: No rashes or swelling noted to left shoulder exquisite tenderness to even light palpation of the anterior shoulder, grip strength 5 out of 5 radial pulse 2+ sensation intact radial medial nerves intact  Neurological:     Mental Status: She is alert and oriented to person, place, and time.     Coordination: Coordination normal.  Psychiatric:         Behavior: Behavior normal.        Thought Content: Thought content normal.        Judgment: Judgment normal.      ED Treatments / Results  Labs (all labs ordered are listed, but only abnormal results are displayed) Labs Reviewed - No data to display  EKG None  Radiology No results found.  Procedures Procedures (including critical care time)  Medications Ordered in ED Medications - No data to display   Initial Impression / Assessment and Plan / ED Course  I have reviewed the triage vital signs and the nursing notes.  Pertinent labs & imaging results that were available during my care of the patient were reviewed by me and considered in my medical decision making (see chart for details).        58 year old female presents today with left shoulder pain.  Very high suspicion for musculoskeletal etiology.  Patient is overly reactive to even light touch at the left shoulder.  She has no signs of infectious etiology, gout, low suspicion for any central chest pain referring to her shoulder.  She will be referred to orthopedics placed in a sling and given return precautions.  Verbalized understanding and agreement to today's plan.  Final Clinical Impressions(s) / ED Diagnoses   Final diagnoses:  Acute pain of left shoulder    ED Discharge Orders    None       Francee Gentile 09/22/18 1238    Quintella Reichert, MD 09/23/18 [phone]

## 2018-09-22 NOTE — ED Triage Notes (Signed)
Patient verbalizes understanding of discharge instructions. Opportunity for questioning and answers were provided. Armband removed by staff, pt discharged from ED.  

## 2018-09-29 DIAGNOSIS — M65331 Trigger finger, right middle finger: Secondary | ICD-10-CM | POA: Diagnosis not present

## 2018-09-29 DIAGNOSIS — M7541 Impingement syndrome of right shoulder: Secondary | ICD-10-CM | POA: Diagnosis not present

## 2018-09-29 DIAGNOSIS — Z7689 Persons encountering health services in other specified circumstances: Secondary | ICD-10-CM | POA: Diagnosis not present

## 2018-09-29 DIAGNOSIS — M7542 Impingement syndrome of left shoulder: Secondary | ICD-10-CM | POA: Diagnosis not present

## 2018-10-01 ENCOUNTER — Telehealth: Payer: Self-pay | Admitting: Internal Medicine

## 2018-10-01 DIAGNOSIS — H538 Other visual disturbances: Secondary | ICD-10-CM

## 2018-10-01 DIAGNOSIS — H524 Presbyopia: Secondary | ICD-10-CM | POA: Diagnosis not present

## 2018-10-01 NOTE — Telephone Encounter (Signed)
°  Patient called stating that she would like a eye doctor referral for family eye care located at  [address] [place]. Please follow up.  [phone]

## 2018-10-01 NOTE — Telephone Encounter (Signed)
Will forward to pcp

## 2018-11-07 DIAGNOSIS — Z7689 Persons encountering health services in other specified circumstances: Secondary | ICD-10-CM | POA: Diagnosis not present

## 2018-11-07 DIAGNOSIS — H43813 Vitreous degeneration, bilateral: Secondary | ICD-10-CM | POA: Diagnosis not present

## 2018-11-10 DIAGNOSIS — Z7689 Persons encountering health services in other specified circumstances: Secondary | ICD-10-CM | POA: Diagnosis not present

## 2018-11-10 DIAGNOSIS — M7541 Impingement syndrome of right shoulder: Secondary | ICD-10-CM | POA: Diagnosis not present

## 2018-11-10 DIAGNOSIS — M7542 Impingement syndrome of left shoulder: Secondary | ICD-10-CM | POA: Diagnosis not present

## 2018-11-19 DIAGNOSIS — Z7689 Persons encountering health services in other specified circumstances: Secondary | ICD-10-CM | POA: Diagnosis not present

## 2018-11-19 DIAGNOSIS — H5213 Myopia, bilateral: Secondary | ICD-10-CM | POA: Diagnosis not present

## 2018-12-22 DIAGNOSIS — Z7689 Persons encountering health services in other specified circumstances: Secondary | ICD-10-CM | POA: Diagnosis not present

## 2018-12-22 DIAGNOSIS — H5213 Myopia, bilateral: Secondary | ICD-10-CM | POA: Diagnosis not present

## 2019-03-20 DIAGNOSIS — Z1152 Encounter for screening for COVID-19: Secondary | ICD-10-CM | POA: Diagnosis not present

## 2019-04-22 ENCOUNTER — Other Ambulatory Visit: Payer: Self-pay | Admitting: Internal Medicine

## 2019-04-22 DIAGNOSIS — I10 Essential (primary) hypertension: Secondary | ICD-10-CM

## 2019-05-11 ENCOUNTER — Ambulatory Visit: Payer: Medicaid Other | Attending: Internal Medicine | Admitting: Internal Medicine

## 2019-05-11 ENCOUNTER — Encounter: Payer: Self-pay | Admitting: Internal Medicine

## 2019-05-11 ENCOUNTER — Other Ambulatory Visit: Payer: Self-pay

## 2019-05-11 VITALS — BP 128/82 | HR 103 | Temp 97.7°F | Resp 16 | Wt 177.8 lb

## 2019-05-11 DIAGNOSIS — I739 Peripheral vascular disease, unspecified: Secondary | ICD-10-CM | POA: Insufficient documentation

## 2019-05-11 DIAGNOSIS — F17210 Nicotine dependence, cigarettes, uncomplicated: Secondary | ICD-10-CM | POA: Insufficient documentation

## 2019-05-11 DIAGNOSIS — Z79899 Other long term (current) drug therapy: Secondary | ICD-10-CM | POA: Diagnosis not present

## 2019-05-11 DIAGNOSIS — F17200 Nicotine dependence, unspecified, uncomplicated: Secondary | ICD-10-CM

## 2019-05-11 DIAGNOSIS — Z7982 Long term (current) use of aspirin: Secondary | ICD-10-CM | POA: Insufficient documentation

## 2019-05-11 DIAGNOSIS — E7800 Pure hypercholesterolemia, unspecified: Secondary | ICD-10-CM | POA: Diagnosis not present

## 2019-05-11 DIAGNOSIS — M25561 Pain in right knee: Secondary | ICD-10-CM | POA: Diagnosis not present

## 2019-05-11 DIAGNOSIS — G8929 Other chronic pain: Secondary | ICD-10-CM

## 2019-05-11 DIAGNOSIS — L84 Corns and callosities: Secondary | ICD-10-CM | POA: Diagnosis not present

## 2019-05-11 DIAGNOSIS — M25461 Effusion, right knee: Secondary | ICD-10-CM | POA: Insufficient documentation

## 2019-05-11 DIAGNOSIS — I10 Essential (primary) hypertension: Secondary | ICD-10-CM | POA: Diagnosis not present

## 2019-05-11 MED ORDER — DICLOFENAC SODIUM 1 % EX GEL: 2.0000 g | Freq: Four times a day (QID) | CUTANEOUS | 2 refills | Status: DC

## 2019-05-11 MED ORDER — ATORVASTATIN CALCIUM 20 MG PO TABS: 20.0000 mg | ORAL_TABLET | Freq: Every day | ORAL | 3 refills | Status: DC

## 2019-05-11 MED ORDER — BUPROPION HCL ER (SR) 150 MG PO TB12: 150.0000 mg | ORAL_TABLET | Freq: Two times a day (BID) | ORAL | 1 refills | Status: DC

## 2019-05-11 MED ORDER — AMLODIPINE BESYLATE 10 MG PO TABS: 10.0000 mg | ORAL_TABLET | Freq: Every day | ORAL | 6 refills | Status: DC

## 2019-05-11 NOTE — Progress Notes (Signed)
Patient ID: Brooke Chapman, female    DOB: [birth year]  MRN: [id number]  CC: Edema (right knee)   Subjective: Brooke Chapman is a 59 y.o. female who presents for chronic ds management Her concerns today include:  Patient with history of PAD left leg, HTN, tobacco dependence, HL.  Last eval 06/2018  C/o pain and swelling in RT knee x 1.5 mths No known injury. Thinks it was caused by her walking more on toes due to pain from callous on sole RT foot. Her significant other shaves it for her.  Does a lot of walking on her job which she started in 01/2019. No hx of arthritis  HYPERTENSION Currently taking: see medication list Med Adherence: [x]  Yes.  She is on amlodipine 10 mg daily.    []  No Medication side effects: []  Yes    [x]  No Adherence with salt restriction: [x]  Yes    []  No Home Monitoring?: []  Yes    []  No Monitoring Frequency: []  Yes    []  No Home BP results range: []  Yes    []  No SOB? []  Yes    [x]  No Chest Pain?: []  Yes    [x]  No Leg swelling?: [x]  Yes    []  No Headaches?: []  Yes    [x]  No Dizziness? []  Yes    [x]  No Comments:   HL: compliant with and tolerating Lipitor  PAD:  Compliant with Pletal BID.  Still gets pain in calf when she walks.  Reports that it is no worse compared to when she last spoke with me.   Has appt with Dr. Donzetta Matters later this mth Down to 10 cig/day from a pk.  Trying to quit.  Never started Bupropion because she read up on the possible side effects and was scared to try it.  HM:  Had c-scope about 2 yrs ago in [place].  She has report at home and will bring it with her on next visit.  Due for pap.  Patient Active Problem List   Diagnosis Date Noted  . 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 PVD (peripheral vascular disease) ([place]) 08/26/2018  . Hyperlipidemia 05/02/2018  . HTN (hypertension) 05/01/2018  . Tobacco dependence 05/01/2018  . PAD (peripheral artery disease) ([place])      Current Outpatient Medications on File Prior to Visit  Medication Sig Dispense  Refill  . aspirin 81 MG chewable tablet Chew 81 mg by mouth daily.    . cilostazol (PLETAL) 100 MG tablet Take 1 tablet (100 mg total) by mouth 2 (two) times daily. 180 tablet 3  . Blood Pressure Monitor DEVI Use as directed to check home blood pressure 2-3 times a week 1 Device 0  . cholecalciferol (VITAMIN D3) 25 MCG (1000 UT) tablet Take 1,000 Units by mouth daily.     No current facility-administered medications on file prior to visit.    No Known Allergies  Social History   Socioeconomic History  . Marital status: Single    Spouse name: Not on file  . Number of children: Not on file  . Years of education: Not on file  . Highest education level: Not on file  Occupational History  . Not on file  Tobacco Use  . Smoking status: Current Every Day Smoker    Packs/day: 1.00  . Smokeless tobacco: Never Used  Substance and Sexual Activity  . Alcohol use: Yes    Comment: rare  . Drug use: Never  . Sexual activity: Not Currently  Other Topics Concern  .  Not on file  Social History Narrative  . Not on file   Social Determinants of Health   Financial Resource Strain:   . Difficulty of Paying Living Expenses:   Food Insecurity:   . Worried About Charity fundraiser in the Last Year:   . Arboriculturist in the Last Year:   Transportation Needs:   . Film/video editor (Medical):   Marland Kitchen Lack of Transportation (Non-Medical):   Physical Activity:   . Days of Exercise per Week:   . Minutes of Exercise per Session:   Stress:   . Feeling of Stress :   Social Connections:   . Frequency of Communication with Friends and Family:   . Frequency of Social Gatherings with Friends and Family:   . Attends Religious Services:   . Active Member of Clubs or Organizations:   . Attends Archivist Meetings:   Marland Kitchen Marital Status:   Intimate Partner Violence:   . Fear of Current or Ex-Partner:   . Emotionally Abused:   Marland Kitchen Physically Abused:   . Sexually Abused:     No family  history on file.  Past Surgical History:  Procedure Laterality Date  . BREAST EXCISIONAL BIOPSY Left    LESS THAN 5 YEARS AGO    ROS: Review of Systems Negative except as stated above  PHYSICAL EXAM: BP 128/82   Pulse (!) 103   Temp 97.7 F (36.5 C)   Resp 16   Wt 177 lb 12.8 oz (80.6 kg)   SpO2 98%   BMI 30.52 kg/m   Physical Exam  General appearance - alert, well appearing, older African-American female and in no distress Mental status - normal mood, behavior, speech, dress, motor activity, and thought processes Mouth - mucous membranes moist, pharynx normal without lesions Neck - supple, no significant adenopathy Chest - clear to auscultation, no wheezes, rales or rhonchi, symmetric air entry Heart - normal rate, regular rhythm, normal S1, S2, no murmurs, rubs, clicks or gallops Musculoskeletal -right knee: Mild to moderate edema and joint enlargement compared to the left knee.  She has mild tenderness on palpation along the medial joint line.  Mild to moderate discomfort with passive range of motion.  No significant crepitus appreciated. Extremities -no edema of the lower extremities. Skin: She has 3 small painful calluses on the sole of the right foot.  The worst one is on the heel  CMP Latest Ref Rng & Units 05/01/2018 04/10/2018  Glucose 65 - 99 mg/dL 84 97  BUN 8 - 27 mg/dL 13 10  Creatinine 0.57 - 1.00 mg/dL 0.81 0.82  Sodium 134 - 144 mmol/L 143 139  Potassium 3.5 - 5.2 mmol/L 4.1 4.2  Chloride 96 - 106 mmol/L 101 103  CO2 20 - 29 mmol/L 25 29  Calcium 8.7 - 10.3 mg/dL 9.6 9.4  Total Protein 6.0 - 8.5 g/dL 7.4 -  Total Bilirubin 0.0 - 1.2 mg/dL 0.2 -  Alkaline Phos 39 - 117 IU/L 71 -  AST 0 - 40 IU/L 22 -  ALT 0 - 32 IU/L 10 -   Lipid Panel     Component Value Date/Time   CHOL 230 (H) 05/01/2018 1551   TRIG 79 05/01/2018 1551   HDL 66 05/01/2018 1551   CHOLHDL 3.5 05/01/2018 1551   LDLCALC 148 (H) 05/01/2018 1551    CBC    Component Value Date/Time     WBC 6.5 05/01/2018 1556   WBC 6.0 04/10/2018 1203  RBC 4.31 05/01/2018 1556   RBC 4.47 04/10/2018 1203   HGB 13.7 05/01/2018 1556   HCT 38.7 05/01/2018 1556   PLT 293 05/01/2018 1556   MCV 90 05/01/2018 1556   MCH 31.8 05/01/2018 1556   MCH 31.1 04/10/2018 1203   MCHC 35.4 05/01/2018 1556   MCHC 31.8 04/10/2018 1203   RDW 12.8 05/01/2018 1556   LYMPHSABS 3.6 (H) 05/01/2018 1556   EOSABS 0.0 05/01/2018 1556   BASOSABS 0.0 05/01/2018 1556    ASSESSMENT AND PLAN:  1. Essential hypertension Close to goal.  Continue current medication and low-salt diet - CBC - Comprehensive metabolic panel - amLODipine (NORVASC) 10 MG tablet; Take 1 tablet (10 mg total) by mouth daily. Must have office visit for refills  Dispense: 30 tablet; Refill: 6  2. Chronic pain of right knee Likely OA with possible fluid accumulation.  She is agreeable to getting an x-ray of the knee.  Further management will be based on results but most likely we will refer to orthopedics. I also recommend referral to podiatry to get her calluses shaved so that she is not having to adjust the way she walks an attempt to decrease pain from the callus - DG Knee Complete 4 Views Right; Future - diclofenac Sodium (VOLTAREN) 1 % GEL; Apply 2 g topically 4 (four) times daily.  Dispense: 100 g; Refill: 2  3. PVD (peripheral vascular disease) ([facility]) Continue Pletal, aspirin, atorvastatin.  Discussed and encourage smoking cessation - Ambulatory referral to Vascular Surgery  4. Tobacco dependence Advised to quit.  Discussed health risks associated with smoking.  We also discussed the bupropion.  She feels she is ready to give it a try.  Less than 5 minutes spent on counseling. - buPROPion (WELLBUTRIN SR) 150 MG 12 hr tablet; Take 1 tablet (150 mg total) by mouth 2 (two) times daily.  Dispense: 60 tablet; Refill: 1  5. Pure hypercholesterolemia - Lipid panel - atorvastatin (LIPITOR) 20 MG tablet; Take 1 tablet (20 mg total) by  mouth daily.  Dispense: 90 tablet; Refill: 3  6. Pre-ulcerative corn or callous - Ambulatory referral to Podiatry   Patient was given the opportunity to ask questions.  Patient verbalized understanding of the plan and was able to repeat key elements of the plan.   Orders Placed This Encounter  Procedures  . DG Knee Complete 4 Views Right  . CBC  . Comprehensive metabolic panel  . Lipid panel  . Ambulatory referral to Vascular Surgery  . Ambulatory referral to Podiatry     Requested Prescriptions   Signed Prescriptions Disp Refills  . diclofenac Sodium (VOLTAREN) 1 % GEL 100 g 2    Sig: Apply 2 g topically 4 (four) times daily.  Marland Kitchen atorvastatin (LIPITOR) 20 MG tablet 90 tablet 3    Sig: Take 1 tablet (20 mg total) by mouth daily.  Marland Kitchen amLODipine (NORVASC) 10 MG tablet 30 tablet 6    Sig: Take 1 tablet (10 mg total) by mouth daily. Must have office visit for refills  . buPROPion (WELLBUTRIN SR) 150 MG 12 hr tablet 60 tablet 1    Sig: Take 1 tablet (150 mg total) by mouth 2 (two) times daily.    Return in about 6 weeks (around 06/22/2019) for PAP.  Karle Plumber, MD, FACP

## 2019-05-11 NOTE — Progress Notes (Signed)
Pt states right knee edema x1 month

## 2019-05-12 LAB — COMPREHENSIVE METABOLIC PANEL
ALT: 9 IU/L (ref 0–32)
AST: 21 IU/L (ref 0–40)
Albumin/Globulin Ratio: 1.5 (ref 1.2–2.2)
Albumin: 4.5 g/dL (ref 3.8–4.8)
Alkaline Phosphatase: 111 IU/L (ref 39–117)
BUN/Creatinine Ratio: 14 (ref 12–28)
BUN: 13 mg/dL (ref 8–27)
Bilirubin Total: <0.2 mg/dL (ref 0.0–1.2)
CO2: 23 mmol/L (ref 20–29)
Calcium: 9.2 mg/dL (ref 8.7–10.3)
Chloride: 101 mmol/L (ref 96–106)
Creatinine, Ser: 0.91 mg/dL (ref 0.57–1.00)
GFR calc Af Amer: 79 mL/min/{1.73_m2} (ref >59)
GFR calc non Af Amer: 68 mL/min/{1.73_m2} (ref >59)
Globulin, Total: 3 g/dL (ref 1.5–4.5)
Glucose: 90 mg/dL (ref 65–99)
Potassium: 4.1 mmol/L (ref 3.5–5.2)
Sodium: 142 mmol/L (ref 134–144)
Total Protein: 7.5 g/dL (ref 6.0–8.5)

## 2019-05-12 LAB — CBC
Hematocrit: 40.5 % (ref 34.0–46.6)
Hemoglobin: 13.3 g/dL (ref 11.1–15.9)
MCH: 30.2 pg (ref 26.6–33.0)
MCHC: 32.8 g/dL (ref 31.5–35.7)
MCV: 92 fL (ref 79–97)
Platelets: 293 10*3/uL (ref 150–450)
RBC: 4.41 x10E6/uL (ref 3.77–5.28)
RDW: 13.5 % (ref 11.7–15.4)
WBC: 8.2 10*3/uL (ref 3.4–10.8)

## 2019-05-12 LAB — LIPID PANEL
Chol/HDL Ratio: 2.9 ratio (ref 0.0–4.4)
Cholesterol, Total: 184 mg/dL (ref 100–199)
HDL: 64 mg/dL (ref >39)
LDL Chol Calc (NIH): 95 mg/dL (ref 0–99)
Triglycerides: 142 mg/dL (ref 0–149)
VLDL Cholesterol Cal: 25 mg/dL (ref 5–40)

## 2019-05-13 ENCOUNTER — Other Ambulatory Visit: Payer: Self-pay | Admitting: *Deleted

## 2019-05-13 ENCOUNTER — Other Ambulatory Visit: Payer: Self-pay | Admitting: Internal Medicine

## 2019-05-13 DIAGNOSIS — E7800 Pure hypercholesterolemia, unspecified: Secondary | ICD-10-CM

## 2019-05-13 DIAGNOSIS — I739 Peripheral vascular disease, unspecified: Secondary | ICD-10-CM

## 2019-05-13 MED ORDER — ATORVASTATIN CALCIUM 20 MG PO TABS: 30.0000 mg | ORAL_TABLET | Freq: Every day | ORAL | 3 refills | Status: DC

## 2019-05-15 ENCOUNTER — Other Ambulatory Visit: Payer: Self-pay

## 2019-05-15 ENCOUNTER — Encounter: Payer: Self-pay | Admitting: Vascular Surgery

## 2019-05-15 ENCOUNTER — Ambulatory Visit (INDEPENDENT_AMBULATORY_CARE_PROVIDER_SITE_OTHER): Payer: Medicaid Other | Admitting: Vascular Surgery

## 2019-05-15 VITALS — BP 126/82 | Ht 64.0 in | Wt 177.5 lb

## 2019-05-15 DIAGNOSIS — I739 Peripheral vascular disease, unspecified: Secondary | ICD-10-CM

## 2019-05-15 NOTE — Progress Notes (Signed)
    Virtual Visit via Telephone Note   I connected with Brooke Chapman on 05/15/2019 using the [email or web address] by telephone and verified that I was speaking with the correct person using two identifiers. Patient was located at at her house.   The limitations of evaluation and management by telemedicine and the availability of in person appointments have been previously discussed with the patient and are documented in the patients chart. The patient expressed understanding and consented to proceed.  PCP: Ladell Pier, MD  Chief Complaint: leg pain  History of Present Illness: Brooke Chapman is a 59 y.o. female with history of possible endovascular intervention when she lived in [place].  I last saw her 10 months ago she was having bilateral lower extremity left greater than right pain.  She continues to have pain with walking occasional pain at rest.  She does appear to have some knee pain that confounds the situation.  She has not had any repeat studies.  She denies any tissue loss or ulceration.  Risk factors for vascular disease include hyperlipidemia hypertension and smoking history.  Past Medical History:  Diagnosis Date  . Hyperlipidemia   . Hypertension   . PAD (peripheral artery disease) ([place])     Past Surgical History:  Procedure Laterality Date  . BREAST EXCISIONAL BIOPSY Left    LESS THAN 5 YEARS AGO    No outpatient medications have been marked as taking for the 05/15/19 encounter (Office Visit) with Waynetta Sandy, MD.    12 system ROS was negative unless otherwise noted in HPI   Observations/Objective: Vitals:   05/15/19 0855  BP: 126/82    Demonstrates good understanding during our conversation  Assessment and Plan: 59 year old female with peripheral arterial disease bilateral lower extremity pain.  ABIs were depressed at last visit.  At this time I think it is prudent to get ABIs rechecked and to evaluate her in person and I discussed this plan with her  today.  I am concerned that she may have progressed to rest pain particularly on the left leg and would require angiography which she has undergone in the past with unknown interventions performed in [place] several years ago.  We will get her set up for ABIs and follow-up in the very near future with me.    I discussed the assessment and treatment plan with the patient. The patient was provided an opportunity to ask questions and all were answered. The patient agreed with the plan and demonstrated an understanding of the instructions.   The patient was advised to call back or seek an in-person evaluation if the symptoms worsen or if the condition fails to improve as anticipated.  I spent 11 minutes with the patient and reviewing records prior to and via telephone encounter.   Signed, Servando Snare [facility] Office: [phone]  05/15/2019, 9:36 AM

## 2019-05-18 ENCOUNTER — Other Ambulatory Visit: Payer: Self-pay | Admitting: *Deleted

## 2019-05-18 DIAGNOSIS — I739 Peripheral vascular disease, unspecified: Secondary | ICD-10-CM

## 2019-05-21 ENCOUNTER — Telehealth (HOSPITAL_COMMUNITY): Payer: Self-pay

## 2019-05-21 NOTE — Telephone Encounter (Signed)

## 2019-05-22 ENCOUNTER — Ambulatory Visit: Payer: Medicaid Other | Admitting: Vascular Surgery

## 2019-05-22 ENCOUNTER — Ambulatory Visit (HOSPITAL_COMMUNITY): Admission: RE | Admit: 2019-05-22 | Payer: Medicaid Other | Source: Ambulatory Visit

## 2019-06-22 ENCOUNTER — Other Ambulatory Visit: Payer: Self-pay

## 2019-06-22 ENCOUNTER — Ambulatory Visit: Payer: Medicaid Other | Attending: Internal Medicine | Admitting: Internal Medicine

## 2019-06-22 ENCOUNTER — Other Ambulatory Visit (HOSPITAL_COMMUNITY)
Admission: RE | Admit: 2019-06-22 | Discharge: 2019-06-22 | Disposition: A | Discharge status: Home / Self Care | Payer: Medicaid Other | Source: Ambulatory Visit | Attending: Internal Medicine | Admitting: Internal Medicine

## 2019-06-22 ENCOUNTER — Encounter: Payer: Self-pay | Admitting: Internal Medicine

## 2019-06-22 VITALS — BP 132/98 | HR 104 | Temp 98.1°F | Resp 16 | Wt 172.2 lb

## 2019-06-22 DIAGNOSIS — Z124 Encounter for screening for malignant neoplasm of cervix: Secondary | ICD-10-CM | POA: Diagnosis not present

## 2019-06-22 DIAGNOSIS — Z7189 Other specified counseling: Secondary | ICD-10-CM

## 2019-06-22 DIAGNOSIS — Z7901 Long term (current) use of anticoagulants: Secondary | ICD-10-CM | POA: Diagnosis not present

## 2019-06-22 DIAGNOSIS — Z1231 Encounter for screening mammogram for malignant neoplasm of breast: Secondary | ICD-10-CM

## 2019-06-22 DIAGNOSIS — Z79899 Other long term (current) drug therapy: Secondary | ICD-10-CM | POA: Diagnosis not present

## 2019-06-22 DIAGNOSIS — Z1211 Encounter for screening for malignant neoplasm of colon: Secondary | ICD-10-CM | POA: Insufficient documentation

## 2019-06-22 DIAGNOSIS — G8929 Other chronic pain: Secondary | ICD-10-CM | POA: Insufficient documentation

## 2019-06-22 DIAGNOSIS — F17210 Nicotine dependence, cigarettes, uncomplicated: Secondary | ICD-10-CM | POA: Diagnosis not present

## 2019-06-22 DIAGNOSIS — I10 Essential (primary) hypertension: Secondary | ICD-10-CM | POA: Diagnosis not present

## 2019-06-22 DIAGNOSIS — Z7982 Long term (current) use of aspirin: Secondary | ICD-10-CM | POA: Diagnosis not present

## 2019-06-22 DIAGNOSIS — E782 Mixed hyperlipidemia: Secondary | ICD-10-CM | POA: Insufficient documentation

## 2019-06-22 DIAGNOSIS — Z8249 Family history of ischemic heart disease and other diseases of the circulatory system: Secondary | ICD-10-CM | POA: Insufficient documentation

## 2019-06-22 DIAGNOSIS — Z7689 Persons encountering health services in other specified circumstances: Secondary | ICD-10-CM | POA: Diagnosis not present

## 2019-06-22 DIAGNOSIS — M25561 Pain in right knee: Secondary | ICD-10-CM | POA: Insufficient documentation

## 2019-06-22 NOTE — Progress Notes (Signed)
Patient ID: Brooke Chapman, female    DOB: [birth year]  MRN: [id number]  CC: Gynecologic Exam   Subjective: Brooke Chapman is a 59 y.o. female who presents for PAP. Her concerns today include:  Patient with history of PAD left leg, HTN, tobacco dependence, HL.  Pt is G0P0 Last pap was 2018.  No abn pap No vaginal dischg or itching Sexually active with one female partner.  She is menopausal No fhx of breast, ovarian or uterine CA  HL:  Dose of statin increased to 30 mg a day based on labs drawn on last visit.  She did receive the MyChart message and is taking the higher dose.  RT knee pain: She finds the Voltaren gel to be helpful.  Still has a little swelling.  Pain is not as bad because school is closed and she is not having to do as much walking and standing on it.  I had ordered x-rays on the last visit.  She has not had the x-rays done as yet.Marland Kitchen  HM:  C-scope done by Dr. Estella Husk [facility] on 05/16/2017.  She brings her discharge summary from the procedure but not the actual colonoscopy report itself.  She will stop at the front desk today to sign a release for [place] to get the actual report. Has not made up mind to get COVID-19 vaccine   Patient Active Problem List   Diagnosis Date Noted  . 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 PVD (peripheral vascular disease) ([place]) 08/26/2018  . Hyperlipidemia 05/02/2018  . HTN (hypertension) 05/01/2018  . Tobacco dependence 05/01/2018  . PAD (peripheral artery disease) ([place])      Current Outpatient Medications on File Prior to Visit  Medication Sig Dispense Refill  . amLODipine (NORVASC) 10 MG tablet Take 1 tablet (10 mg total) by mouth daily. Must have office visit for refills 30 tablet 6  . aspirin 81 MG chewable tablet Chew 81 mg by mouth daily.    Marland Kitchen atorvastatin (LIPITOR) 20 MG tablet Take 1.5 tablets (30 mg total) by mouth daily. 135 tablet 3  . Blood Pressure Monitor DEVI Use as directed to check home blood pressure 2-3 times a week 1  Device 0  . buPROPion (WELLBUTRIN SR) 150 MG 12 hr tablet Take 1 tablet (150 mg total) by mouth 2 (two) times daily. 60 tablet 1  . cholecalciferol (VITAMIN D3) 25 MCG (1000 UT) tablet Take 1,000 Units by mouth daily.    . cilostazol (PLETAL) 100 MG tablet Take 1 tablet (100 mg total) by mouth 2 (two) times daily. 180 tablet 3  . diclofenac Sodium (VOLTAREN) 1 % GEL Apply 2 g topically 4 (four) times daily. 100 g 2   No current facility-administered medications on file prior to visit.    No Known Allergies  Social History   Socioeconomic History  . Marital status: Single    Spouse name: Not on file  . Number of children: Not on file  . Years of education: Not on file  . Highest education level: Not on file  Occupational History  . Not on file  Tobacco Use  . Smoking status: Current Every Day Smoker    Packs/day: 1.00  . Smokeless tobacco: Never Used  Substance and Sexual Activity  . Alcohol use: Yes    Comment: rare  . Drug use: Never  . Sexual activity: Not Currently  Other Topics Concern  . Not on file  Social History Narrative  . Not on file  Social Determinants of Health   Financial Resource Strain:   . Difficulty of Paying Living Expenses:   Food Insecurity:   . Worried About Charity fundraiser in the Last Year:   . Arboriculturist in the Last Year:   Transportation Needs:   . Film/video editor (Medical):   Marland Kitchen Lack of Transportation (Non-Medical):   Physical Activity:   . Days of Exercise per Week:   . Minutes of Exercise per Session:   Stress:   . Feeling of Stress :   Social Connections:   . Frequency of Communication with Friends and Family:   . Frequency of Social Gatherings with Friends and Family:   . Attends Religious Services:   . Active Member of Clubs or Organizations:   . Attends Archivist Meetings:   Marland Kitchen Marital Status:   Intimate Partner Violence:   . Fear of Current or Ex-Partner:   . Emotionally Abused:   Marland Kitchen Physically  Abused:   . Sexually Abused:     Family History  Problem Relation Age of Onset  . Heart disease Mother     Past Surgical History:  Procedure Laterality Date  . BREAST EXCISIONAL BIOPSY Left    LESS THAN 5 YEARS AGO    ROS: Review of Systems Negative except as stated above  PHYSICAL EXAM: BP (!) 132/98   Pulse (!) 104   Temp 98.1 F (36.7 C)   Resp 16   Wt 172 lb 3.2 oz (78.1 kg)   SpO2 97%   BMI 29.56 kg/m   Physical Exam  General appearance - alert, well appearing, and in no distress Mental status - normal mood, behavior, speech, dress, motor activity, and thought processes Breasts -CMA Pollock present for breast and pelvic exam: Breasts appear normal, no suspicious masses, no skin or nipple changes or axillary nodes Pelvic - normal external genitalia, vulva, vagina, cervix, uterus and adnexa Knee: Right knee joint and surrounding soft tissues are still a bit enlarged compared to the left. CMP Latest Ref Rng & Units 05/11/2019 05/01/2018 04/10/2018  Glucose 65 - 99 mg/dL 90 84 97  BUN 8 - 27 mg/dL 13 13 10   Creatinine 0.57 - 1.00 mg/dL 0.91 0.81 0.82  Sodium 134 - 144 mmol/L 142 143 139  Potassium 3.5 - 5.2 mmol/L 4.1 4.1 4.2  Chloride 96 - 106 mmol/L 101 101 103  CO2 20 - 29 mmol/L 23 25 29   Calcium 8.7 - 10.3 mg/dL 9.2 9.6 9.4  Total Protein 6.0 - 8.5 g/dL 7.5 7.4 -  Total Bilirubin 0.0 - 1.2 mg/dL <0.2 0.2 -  Alkaline Phos 39 - 117 IU/L 111 71 -  AST 0 - 40 IU/L 21 22 -  ALT 0 - 32 IU/L 9 10 -   Lipid Panel     Component Value Date/Time   CHOL 184 05/11/2019 1534   TRIG 142 05/11/2019 1534   HDL 64 05/11/2019 1534   CHOLHDL 2.9 05/11/2019 1534   LDLCALC 95 05/11/2019 1534    CBC    Component Value Date/Time   WBC 8.2 05/11/2019 1534   WBC 6.0 04/10/2018 1203   RBC 4.41 05/11/2019 1534   RBC 4.47 04/10/2018 1203   HGB 13.3 05/11/2019 1534   HCT 40.5 05/11/2019 1534   PLT 293 05/11/2019 1534   MCV 92 05/11/2019 1534   MCH 30.2 05/11/2019 1534    MCH 31.1 04/10/2018 1203   MCHC 32.8 05/11/2019 1534   MCHC 31.8 04/10/2018  1203   RDW 13.5 05/11/2019 1534   LYMPHSABS 3.6 (H) 05/01/2018 1556   EOSABS 0.0 05/01/2018 1556   BASOSABS 0.0 05/01/2018 1556    ASSESSMENT AND PLAN: 1. Pap smear for cervical cancer screening - Cytology - PAP - Cervicovaginal ancillary only  2. Mixed hyperlipidemia Continue atorvastatin at dose of 30 mg/day.  3. Chronic pain of right knee Doing better with Voltaren gel.  She will go and get the x-ray done as ordered on last visit  4. Educated about COVID-19 virus infection Discussed her concerns about having the COVID-19 vaccine.  I have told her that the vaccines are relatively safe and effective in preventing the infection and I have encouraged her to get the vaccine  5. Encounter for screening mammogram for malignant neoplasm of breast - MM Digital Screening; Future  She will stop at the front desk and sign a release for [place] to get her colonoscopy  Patient was given the opportunity to ask questions.  Patient verbalized understanding of the plan and was able to repeat key elements of the plan.   No orders of the defined types were placed in this encounter.    Requested Prescriptions    No prescriptions requested or ordered in this encounter    No follow-ups on file.  Karle Plumber, MD, FACP

## 2019-06-23 LAB — CERVICOVAGINAL ANCILLARY ONLY
Bacterial Vaginitis (gardnerella): NEGATIVE
Candida Glabrata: NEGATIVE
Candida Vaginitis: NEGATIVE
Chlamydia: NEGATIVE
Comment: NEGATIVE
Comment: NEGATIVE
Comment: NEGATIVE
Comment: NEGATIVE
Comment: NEGATIVE
Comment: NORMAL
Neisseria Gonorrhea: NEGATIVE
Trichomonas: NEGATIVE

## 2019-06-25 ENCOUNTER — Telehealth (HOSPITAL_COMMUNITY): Payer: Self-pay

## 2019-06-25 NOTE — Telephone Encounter (Signed)

## 2019-06-26 ENCOUNTER — Other Ambulatory Visit: Payer: Self-pay

## 2019-06-26 ENCOUNTER — Encounter: Payer: Self-pay | Admitting: Vascular Surgery

## 2019-06-26 ENCOUNTER — Ambulatory Visit (HOSPITAL_COMMUNITY)
Admission: RE | Admit: 2019-06-26 | Discharge: 2019-06-26 | Disposition: A | Discharge status: Home / Self Care | Payer: Medicaid Other | Source: Ambulatory Visit | Attending: Vascular Surgery | Admitting: Vascular Surgery

## 2019-06-26 ENCOUNTER — Ambulatory Visit (INDEPENDENT_AMBULATORY_CARE_PROVIDER_SITE_OTHER): Payer: Medicaid Other | Admitting: Vascular Surgery

## 2019-06-26 VITALS — BP 158/106 | HR 86 | Temp 97.5°F | Resp 20 | Wt 170.0 lb

## 2019-06-26 DIAGNOSIS — I739 Peripheral vascular disease, unspecified: Secondary | ICD-10-CM

## 2019-06-26 NOTE — Progress Notes (Signed)
Patient ID: Brooke Chapman, female   DOB: 07-10-1960, 59 y.o.   MRN: [id number]  Reason for Consult: Follow-up   Referred by Ladell Pier, MD  Subjective:     HPI:  Brooke Chapman is a 59 y.o. female history of possible endovascular intervention when she lived in [place] although she is unsure that she has any stents.  She has now right greater than left lower extremity pain.  She has pain with walking with calf cramping also has occasional pain at rest this is particularly true with her right foot but she does have calluses there.  She also has right knee pain.  She works on her feet states that she has limitations to how much she can walk.  Risk factors include hyperlipidemia, hypertension and history of smoking.  She does continue to smoke.  She does take aspirin and a statin.  Past Medical History:  Diagnosis Date  . Hyperlipidemia   . Hypertension   . PAD (peripheral artery disease) ([facility])    Family History  Problem Relation Age of Onset  . Heart disease Mother    Past Surgical History:  Procedure Laterality Date  . BREAST EXCISIONAL BIOPSY Left    LESS THAN 5 YEARS AGO    Short Social History:  Social History   Tobacco Use  . Smoking status: Current Every Day Smoker    Packs/day: 0.50    Types: Cigarettes  . Smokeless tobacco: Never Used  . Tobacco comment: 6-10 cigs daily  Substance Use Topics  . Alcohol use: Yes    Comment: rare    No Known Allergies  Current Outpatient Medications  Medication Sig Dispense Refill  . amLODipine (NORVASC) 10 MG tablet Take 1 tablet (10 mg total) by mouth daily. Must have office visit for refills 30 tablet 6  . aspirin 81 MG chewable tablet Chew 81 mg by mouth daily.    Marland Kitchen atorvastatin (LIPITOR) 20 MG tablet Take 1.5 tablets (30 mg total) by mouth daily. 135 tablet 3  . Blood Pressure Monitor DEVI Use as directed to check home blood pressure 2-3 times a week 1 Device 0  . buPROPion (WELLBUTRIN SR) 150 MG 12 hr tablet Take 1  tablet (150 mg total) by mouth 2 (two) times daily. 60 tablet 1  . cholecalciferol (VITAMIN D3) 25 MCG (1000 UT) tablet Take 1,000 Units by mouth daily.    . cilostazol (PLETAL) 100 MG tablet Take 1 tablet (100 mg total) by mouth 2 (two) times daily. 180 tablet 3  . diclofenac Sodium (VOLTAREN) 1 % GEL Apply 2 g topically 4 (four) times daily. 100 g 2   No current facility-administered medications for this visit.    Review of Systems  Constitutional:  Constitutional negative. HENT: HENT negative.  Eyes: Eyes negative.  Respiratory: Respiratory negative.  Cardiovascular: Positive for claudication.  GI: Gastrointestinal negative.  Musculoskeletal: Positive for gait problem, leg pain and joint pain.  Neurological: Negative for dizziness, facial asymmetry, focal weakness, headaches, light-headedness, numbness, seizures and syncope.  Hematologic: Hematologic/lymphatic negative.  Psychiatric: Psychiatric negative.        Objective:  Objective   Vitals:   06/26/19 0926  BP: (!) 158/106  Pulse: 86  Resp: 20  Temp: (!) 97.5 F (36.4 C)  SpO2: 98%  Weight: 170 lb (77.1 kg)   Body mass index is 29.18 kg/m.  Physical Exam Constitutional:      Appearance: Normal appearance.  HENT:     Head: Normocephalic.  Nose:     Comments: Mask in place Eyes:     Pupils: Pupils are equal, round, and reactive to light.  Cardiovascular:     Rate and Rhythm: Normal rate.     Pulses:          Femoral pulses are 2+ on the right side and 2+ on the left side.      Popliteal pulses are 0 on the right side and 0 on the left side.  Pulmonary:     Effort: Pulmonary effort is normal.     Breath sounds: Normal breath sounds.  Abdominal:     General: Abdomen is flat.     Palpations: Abdomen is soft. There is no mass.  Musculoskeletal:        General: No swelling. Normal range of motion.  Skin:    General: Skin is warm and dry.     Capillary Refill: Capillary refill takes less than 2 seconds.   Neurological:     General: No focal deficit present.     Mental Status: She is alert.  Psychiatric:        Mood and Affect: Mood normal.        Behavior: Behavior normal.        Thought Content: Thought content normal.        Judgment: Judgment normal.     Data: I have independently interpreted her ABIs to be right 0.62 and monophasic and left 0.68 and monophasic.  Toe pressure on the right 40 and left 63.     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  She does have low toe pressure on the right at 40 does have some calluses there but no frank wounds.  I discussed the multifactorial nature of her lower extremity pain and also the need to quit smoking.  She continues on aspirin and statin.  We will begin with angiography from the left common femoral approach focusing on the right leg.  I discussed with her the possible outcomes including stenting versus balloon angioplasty versus the need for further surgery although I think she would be less likely to proceed with surgical intervention at this time.  Also discussed the risk and benefits including groin complications.  She demonstrates good understanding we will get her scheduled for the near future.      Waynetta Sandy MD [facility]

## 2019-06-30 LAB — CYTOLOGY - PAP
Comment: NEGATIVE
Comment: NEGATIVE
Comment: NEGATIVE
Diagnosis: NEGATIVE
HPV 16: NEGATIVE
HPV 18 / 45: NEGATIVE
High risk HPV: POSITIVE — AB

## 2019-07-07 DIAGNOSIS — Z7689 Persons encountering health services in other specified circumstances: Secondary | ICD-10-CM | POA: Diagnosis not present

## 2019-07-10 ENCOUNTER — Other Ambulatory Visit (HOSPITAL_COMMUNITY): Payer: Medicaid Other

## 2019-07-11 ENCOUNTER — Other Ambulatory Visit (HOSPITAL_COMMUNITY): Payer: Medicaid Other

## 2019-07-13 ENCOUNTER — Encounter (HOSPITAL_COMMUNITY)
Admission: RE | Disposition: A | Discharge status: Home / Self Care | Payer: Self-pay | Source: Home / Self Care | Attending: Vascular Surgery

## 2019-07-13 ENCOUNTER — Ambulatory Visit (HOSPITAL_COMMUNITY)
Admission: RE | Admit: 2019-07-13 | Discharge: 2019-07-13 | Disposition: A | Discharge status: Home / Self Care | Payer: Medicaid Other | Attending: Vascular Surgery | Admitting: Vascular Surgery

## 2019-07-13 ENCOUNTER — Other Ambulatory Visit: Payer: Self-pay

## 2019-07-13 DIAGNOSIS — F17210 Nicotine dependence, cigarettes, uncomplicated: Secondary | ICD-10-CM | POA: Diagnosis not present

## 2019-07-13 DIAGNOSIS — I10 Essential (primary) hypertension: Secondary | ICD-10-CM | POA: Diagnosis not present

## 2019-07-13 DIAGNOSIS — I70213 Atherosclerosis of native arteries of extremities with intermittent claudication, bilateral legs: Secondary | ICD-10-CM | POA: Diagnosis not present

## 2019-07-13 DIAGNOSIS — E785 Hyperlipidemia, unspecified: Secondary | ICD-10-CM | POA: Diagnosis not present

## 2019-07-13 DIAGNOSIS — Z79899 Other long term (current) drug therapy: Secondary | ICD-10-CM | POA: Insufficient documentation

## 2019-07-13 DIAGNOSIS — Z8249 Family history of ischemic heart disease and other diseases of the circulatory system: Secondary | ICD-10-CM | POA: Diagnosis not present

## 2019-07-13 DIAGNOSIS — Z7982 Long term (current) use of aspirin: Secondary | ICD-10-CM | POA: Diagnosis not present

## 2019-07-13 DIAGNOSIS — Z7689 Persons encountering health services in other specified circumstances: Secondary | ICD-10-CM | POA: Diagnosis not present

## 2019-07-13 HISTORY — PX: PERIPHERAL VASCULAR INTERVENTION: CATH118257

## 2019-07-13 HISTORY — PX: ABDOMINAL AORTOGRAM W/LOWER EXTREMITY: CATH118223

## 2019-07-13 LAB — POCT I-STAT, CHEM 8
BUN: 8 mg/dL (ref 8–23)
Calcium, Ion: 1.22 mmol/L (ref 1.15–1.40)
Chloride: 100 mmol/L (ref 98–111)
Creatinine, Ser: 0.9 mg/dL (ref 0.44–1.00)
Glucose, Bld: 88 mg/dL (ref 70–99)
HCT: 43 % (ref 36.0–46.0)
Hemoglobin: 14.6 g/dL (ref 12.0–15.0)
Potassium: 3.8 mmol/L (ref 3.5–5.1)
Sodium: 142 mmol/L (ref 135–145)
TCO2: 32 mmol/L (ref 22–32)

## 2019-07-13 SURGERY — ABDOMINAL AORTOGRAM W/LOWER EXTREMITY
Anesthesia: LOCAL | Laterality: Left

## 2019-07-13 MED ORDER — IODIXANOL 320 MG/ML IV SOLN
INTRAVENOUS | Status: DC | PRN
  Administered 2019-07-13: 145 mL

## 2019-07-13 MED ORDER — CLOPIDOGREL BISULFATE 300 MG PO TABS
ORAL_TABLET | ORAL | Status: DC | PRN
  Administered 2019-07-13: 300 mg via ORAL

## 2019-07-13 MED ORDER — LABETALOL HCL 5 MG/ML IV SOLN: 10.0000 mg | INTRAVENOUS | Status: DC | PRN

## 2019-07-13 MED ORDER — HEPARIN (PORCINE) IN NACL 1000-0.9 UT/500ML-% IV SOLN
INTRAVENOUS | Status: DC | PRN
  Administered 2019-07-13 (×2): 500 mL

## 2019-07-13 MED ORDER — MIDAZOLAM HCL 2 MG/2ML IJ SOLN
INTRAMUSCULAR | Status: AC
  Filled 2019-07-13: qty 2

## 2019-07-13 MED ORDER — HYDRALAZINE HCL 20 MG/ML IJ SOLN: 5.0000 mg | INTRAMUSCULAR | Status: DC | PRN

## 2019-07-13 MED ORDER — HEPARIN (PORCINE) IN NACL 1000-0.9 UT/500ML-% IV SOLN
INTRAVENOUS | Status: AC
  Filled 2019-07-13: qty 1000

## 2019-07-13 MED ORDER — HEPARIN SODIUM (PORCINE) 1000 UNIT/ML IJ SOLN
INTRAMUSCULAR | Status: DC | PRN
  Administered 2019-07-13: 8000 [IU] via INTRAVENOUS

## 2019-07-13 MED ORDER — HEPARIN SODIUM (PORCINE) 1000 UNIT/ML IJ SOLN
INTRAMUSCULAR | Status: AC
  Filled 2019-07-13: qty 1

## 2019-07-13 MED ORDER — CLOPIDOGREL BISULFATE 75 MG PO TABS
75.0000 mg | ORAL_TABLET | Freq: Every day | ORAL | 11 refills | Status: DC
Start: 2019-07-13 — End: 2019-12-02

## 2019-07-13 MED ORDER — LIDOCAINE HCL (PF) 1 % IJ SOLN
INTRAMUSCULAR | Status: AC
  Filled 2019-07-13: qty 30

## 2019-07-13 MED ORDER — ACETAMINOPHEN 325 MG PO TABS: 650.0000 mg | ORAL_TABLET | ORAL | Status: DC | PRN

## 2019-07-13 MED ORDER — FENTANYL CITRATE (PF) 100 MCG/2ML IJ SOLN
INTRAMUSCULAR | Status: DC | PRN
  Administered 2019-07-13: 50 ug via INTRAVENOUS

## 2019-07-13 MED ORDER — ONDANSETRON HCL 4 MG/2ML IJ SOLN: 4.0000 mg | Freq: Four times a day (QID) | INTRAMUSCULAR | Status: DC | PRN

## 2019-07-13 MED ORDER — SODIUM CHLORIDE 0.9% FLUSH: 3.0000 mL | Freq: Two times a day (BID) | INTRAVENOUS | Status: DC

## 2019-07-13 MED ORDER — FENTANYL CITRATE (PF) 100 MCG/2ML IJ SOLN
INTRAMUSCULAR | Status: AC
  Filled 2019-07-13: qty 2

## 2019-07-13 MED ORDER — CLOPIDOGREL BISULFATE 300 MG PO TABS
ORAL_TABLET | ORAL | Status: AC
  Filled 2019-07-13: qty 1

## 2019-07-13 MED ORDER — SODIUM CHLORIDE 0.9 % IV SOLN: INTRAVENOUS | Status: DC

## 2019-07-13 MED ORDER — MIDAZOLAM HCL 2 MG/2ML IJ SOLN
INTRAMUSCULAR | Status: DC | PRN
  Administered 2019-07-13: 1 mg via INTRAVENOUS

## 2019-07-13 MED ORDER — SODIUM CHLORIDE 0.9 % IV SOLN: 250.0000 mL | INTRAVENOUS | Status: DC | PRN

## 2019-07-13 MED ORDER — CLOPIDOGREL BISULFATE 75 MG PO TABS: 300.0000 mg | ORAL_TABLET | Freq: Once | ORAL | Status: DC

## 2019-07-13 MED ORDER — LIDOCAINE HCL (PF) 1 % IJ SOLN
INTRAMUSCULAR | Status: DC | PRN
  Administered 2019-07-13: 15 mL

## 2019-07-13 MED ORDER — CLOPIDOGREL BISULFATE 75 MG PO TABS: 75.0000 mg | ORAL_TABLET | Freq: Every day | ORAL | Status: DC

## 2019-07-13 MED ORDER — SODIUM CHLORIDE 0.9% FLUSH: 3.0000 mL | INTRAVENOUS | Status: DC | PRN

## 2019-07-13 SURGICAL SUPPLY — 16 items
CATH OMNI FLUSH 5F 65CM (CATHETERS) ×3 IMPLANT
CATH STRAIGHT 5FR 65CM (CATHETERS) ×3 IMPLANT
CLOSURE MYNX CONTROL 6F/7F (Vascular Products) ×3 IMPLANT
KIT ENCORE 26 ADVANTAGE (KITS) ×3 IMPLANT
KIT MICROPUNCTURE NIT STIFF (SHEATH) ×3 IMPLANT
KIT PV (KITS) ×3 IMPLANT
SHEATH BRITE TIP 7FR 35CM (SHEATH) ×3 IMPLANT
SHEATH PINNACLE 5F 10CM (SHEATH) ×3 IMPLANT
SHEATH PINNACLE 7F 10CM (SHEATH) ×3 IMPLANT
SHEATH PROBE COVER 6X72 (BAG) ×3 IMPLANT
STENT VIABAHN 7X29X80 VBX (Permanent Stent) ×3 IMPLANT
SYR MEDRAD MARK V 150ML (SYRINGE) ×3 IMPLANT
TRANSDUCER W/STOPCOCK (MISCELLANEOUS) ×3 IMPLANT
TRAY PV CATH (CUSTOM PROCEDURE TRAY) ×3 IMPLANT
WIRE BENTSON .035X145CM (WIRE) ×3 IMPLANT
WIRE ROSEN-J .035X180CM (WIRE) ×3 IMPLANT

## 2019-07-13 NOTE — Discharge Instructions (Signed)
Femoral Site Care This sheet gives you information about how to care for yourself after your procedure. Your health care provider may also give you more specific instructions. If you have problems or questions, contact your health care provider. What can I expect after the procedure? After the procedure, it is common to have:  Bruising that usually fades within 1-2 weeks.  Tenderness at the site. Follow these instructions at home: Wound care  Follow instructions from your health care provider about how to take care of your insertion site. Make sure you: ? Wash your hands with soap and water before you change your bandage (dressing). If soap and water are not available, use hand sanitizer. ? Change your dressing as told by your health care provider. ? Leave stitches (sutures), skin glue, or adhesive strips in place. These skin closures may need to stay in place for 2 weeks or longer. If adhesive strip edges start to loosen and curl up, you may trim the loose edges. Do not remove adhesive strips completely unless your health care provider tells you to do that.  Do not take baths, swim, or use a hot tub until your health care provider approves.  You may shower 24-48 hours after the procedure or as told by your health care provider. ? Gently wash the site with plain soap and water. ? Pat the area dry with a clean towel. ? Do not rub the site. This may cause bleeding.  Do not apply powder or lotion to the site. Keep the site clean and dry.  Check your femoral site every day for signs of infection. Check for: ? Redness, swelling, or pain. ? Fluid or blood. ? Warmth. ? Pus or a bad smell. Activity  For the first 2-3 days after your procedure, or as long as directed: ? Avoid climbing stairs as much as possible. ? Do not squat.  Do not lift anything that is heavier than 10 lb (4.5 kg), or the limit that you are told, until your health care provider says that it is safe.  Rest as  directed. ? Avoid sitting for a long time without moving. Get up to take short walks every 1-2 hours.  Do not drive for 24 hours if you were given a medicine to help you relax (sedative). General instructions  Take over-the-counter and prescription medicines only as told by your health care provider.  Keep all follow-up visits as told by your health care provider. This is important. Contact a health care provider if you have:  A fever or chills.  You have redness, swelling, or pain around your insertion site. Get help right away if:  The catheter insertion area swells very fast.  You pass out.  You suddenly start to sweat or your skin gets clammy.  The catheter insertion area is bleeding, and the bleeding does not stop when you hold steady pressure on the area.  The area near or just beyond the catheter insertion site becomes pale, cool, tingly, or numb. These symptoms may represent a serious problem that is an emergency. Do not wait to see if the symptoms will go away. Get medical help right away. Call your local emergency services (911 in the [place] Do not drive yourself to the hospital. Summary  After the procedure, it is common to have bruising that usually fades within 1-2 weeks.  Check your femoral site every day for signs of infection.  Do not lift anything that is heavier than 10 lb (4.5 kg), or the   limit that you are told, until your health care provider says that it is safe. This information is not intended to replace advice given to you by your health care provider. Make sure you discuss any questions you have with your health care provider. Document Revised: 01/28/2017 Document Reviewed: 01/28/2017 Elsevier Patient Education  2020 Elsevier Inc.  

## 2019-07-13 NOTE — H&P (Signed)
   History and Physical Update  The patient was interviewed and re-examined.  The patient's previous History and Physical has been reviewed and is unchanged from recent office visit. Plan for aortogram with possible intervention on the right today.   Lantz Hermann C. Donzetta Matters, MD [facility] [place] Office: [phone] Pager: [phone]  07/13/2019, 7:24 AM

## 2019-07-13 NOTE — Progress Notes (Signed)
Discharge instructions reviewed with patient and family. Verbalized understanding. 

## 2019-07-13 NOTE — Op Note (Signed)
    Patient name: ANJELINA DUNG MRN: [id number] DOB: 1960-10-02 Sex: female  07/13/2019 Pre-operative Diagnosis: Bilateral lower extremity life limiting claudication Post-operative diagnosis:  Same Surgeon:  Eda Paschal. Donzetta Matters, MD Procedure Performed: 1.  Ultrasound-guided cannulation left common femoral artery 2.  Aortogram with bilateral lower extremity runoff 3.  Pullback gradient left common iliac artery 4.  Stent of left common iliac artery 7 x 29 VBX 5.  Mynx device closure left common femoral artery 6.  Moderate sedation with fentanyl and Versed for 33 minutes   Indications: 59 year old female with bilateral lower extremity life limiting claudication.  She supposedly has previous angiography performed in [place] of the left lower extremity.  She is now having bilateral right greater than left pain.  She is indicated for angiography with possible intervention.  Findings: The aorta tapers after the renal arteries which both appear patent.  The right renal artery is lower does not appear to have any stenosis in either renal artery.  The SMA and celiac artery also fill without stenosis.  Distal aorta tapers left common iliac artery has angiographically 75% stenosis with pullback gradient greater than 30 mmHg.  The lesion was 20 mm prior to stenting after stenting this is reduced to 0% stenosis and there is no pullback gradient across the stent.  The right common femoral artery appears heavily diseased with stenosis greater than 50% of the profunda.  The SFA is diminutive and then occludes and appears to reconstitute below-knee popliteal artery with at least posterior tibial and anterior tibial artery runoff to the ankle the left side similarly has common femoral disease although by ultrasound the anterior wall was free of disease.  SFA occludes appears to flush occluded.  She reconstitutes what appears to be the TP trunk may be popliteal and has anterior tibial and posterior tibial runoff is the  dominant to the ankle.   Procedure:  The patient was identified in the holding area and taken to room 8.  The patient was then placed supine on the table and prepped and draped in the usual sterile fashion.  A time out was called.  Ultrasound was used to evaluate the left common femoral artery.  This was calcified posteriorly anterior wall was soft and patent.  There is no sizable percent lidocaine cannulated micropuncture needle followed wire sheath.  Images saved the permanent record.  We placed a Bentson wire followed by 5 [place] sheath down the catheters to the level of L1 performed aortogram followed by bilateral lower extremity runoff with the above findings.  We then performed angled angiography of the left common iliac artery.  Pullback gradient was greater than 30 mmHg.  We elected to primarily stent.  We placed a Rosen wire exchanged for a bright tip 7 [place] sheath.  We then performed angiography.  We placed a 7 x 29 mm VBX.  The lesion itself was 20 mm.  Stenosis was fully resolved there was no further pullback gradient.  Satisfied we exchanged for short 7 [place] sheath and deployed a minx device.  She tolerated procedure without any complication.   Contrast: 145cc  Tamya Denardo C. Donzetta Matters, MD [facility] [place] Office: [phone] Pager: [phone]

## 2019-07-14 ENCOUNTER — Encounter (HOSPITAL_COMMUNITY): Payer: Self-pay | Admitting: Vascular Surgery

## 2019-07-15 ENCOUNTER — Other Ambulatory Visit: Payer: Self-pay

## 2019-07-15 ENCOUNTER — Ambulatory Visit: Payer: Medicaid Other | Admitting: Podiatry

## 2019-07-15 ENCOUNTER — Encounter: Payer: Self-pay | Admitting: Podiatry

## 2019-07-15 DIAGNOSIS — Z7689 Persons encountering health services in other specified circumstances: Secondary | ICD-10-CM | POA: Diagnosis not present

## 2019-07-15 DIAGNOSIS — D689 Coagulation defect, unspecified: Secondary | ICD-10-CM

## 2019-07-15 DIAGNOSIS — I739 Peripheral vascular disease, unspecified: Secondary | ICD-10-CM

## 2019-07-15 DIAGNOSIS — Q666 Other congenital valgus deformities of feet: Secondary | ICD-10-CM | POA: Diagnosis not present

## 2019-07-15 DIAGNOSIS — Q828 Other specified congenital malformations of skin: Secondary | ICD-10-CM

## 2019-07-15 NOTE — Progress Notes (Addendum)
This patient present to the office  with chief complaint of callus developing on her right foot..  She says this callus has become painful walking and wearing her shoes.  Patient says she has self treated her callus for years and presents to the office for treatment and to discuss additional treatment.  She says she uses a pumice stone frequently and has tried insoles.  Problem persists. Patient has provided no  sought professional help.  She presents to the office for treatment of her painful callus.  Patient is taking pletal.    Vascular  Dorsalis pedis and posterior tibial pulses are not  palpable left foot.  Dorsalis pedis is palpable and posterior tibial pulse is not palpable.right foot..  Capillary return  WNL.  Cold feet..  Skin turgor  WNL  Sensorium  Senn Weinstein monofilament wire  Diminished.  . Normal tactile sensation.  Nail Exam  Patient has normal nails with no evidence of bacterial or fungal infection.  Orthopedic  Exam  Muscle tone and muscle strength  WNL.  No limitations of motion feet  B/L.  No crepitus or joint effusion noted.  Foot type is unremarkable and digits show no abnormalities.  Bony prominences are unremarkable.  Plantar flexed fifth metatarsal  B/L.HAV  B/L.  Skin  No open lesions.  Normal skin texture and turgor.  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  Porokeratosis/Callus right foot.    IE  Debride callus/porokeratosis using a # 15 blade.    Discussed condition with patient.  To consider surgical evaluation to see if surgery would help.  Need to check her circulation prior to surgical   Consideration.  RTC prn.  Gardiner Barefoot DPM

## 2019-07-17 ENCOUNTER — Telehealth: Payer: Self-pay

## 2019-07-17 NOTE — Telephone Encounter (Signed)
Telephone call received from pt inquiring about when she'll be scheduled for post procedure f/u appt with Dr. Donzetta Matters. Per staff message pt is to f/u with Dr. Donzetta Matters in 3-4 weeks with ABI and bilateral lower extremity greater saphenous vein mapping preop for bypass. Follow-up with him to discuss surgery.  Resent staff message to the Administrative pool to contact pt for appt.   Pt was contacted by scheduling and appts made for 08/12/19 for imaging and 08/14/19 to f/u with Dr. Donzetta Matters.

## 2019-07-27 ENCOUNTER — Other Ambulatory Visit: Payer: Self-pay

## 2019-07-27 ENCOUNTER — Ambulatory Visit (HOSPITAL_BASED_OUTPATIENT_CLINIC_OR_DEPARTMENT_OTHER)
Admission: RE | Admit: 2019-07-27 | Discharge: 2019-07-27 | Disposition: A | Discharge status: Home / Self Care | Payer: Medicaid Other | Source: Ambulatory Visit | Attending: Internal Medicine | Admitting: Internal Medicine

## 2019-07-27 DIAGNOSIS — M25561 Pain in right knee: Secondary | ICD-10-CM | POA: Insufficient documentation

## 2019-07-27 DIAGNOSIS — G8929 Other chronic pain: Secondary | ICD-10-CM | POA: Diagnosis not present

## 2019-07-30 ENCOUNTER — Ambulatory Visit (HOSPITAL_BASED_OUTPATIENT_CLINIC_OR_DEPARTMENT_OTHER): Payer: Medicaid Other

## 2019-08-06 ENCOUNTER — Other Ambulatory Visit: Payer: Self-pay | Admitting: *Deleted

## 2019-08-06 DIAGNOSIS — I739 Peripheral vascular disease, unspecified: Secondary | ICD-10-CM

## 2019-08-11 ENCOUNTER — Telehealth: Payer: Self-pay | Admitting: Internal Medicine

## 2019-08-11 DIAGNOSIS — I739 Peripheral vascular disease, unspecified: Secondary | ICD-10-CM

## 2019-08-11 NOTE — Telephone Encounter (Signed)
Will forward to pcp

## 2019-08-11 NOTE — Telephone Encounter (Signed)
Please advise.  Copied from [place] [phone]. Topic: General - Other >> Aug 11, 2019  9:54 AM Rainey Pines A wrote: Patient is requesting referral be placed to [facility] for Dr .Donzetta Matters. Fax number is [phone]

## 2019-08-12 ENCOUNTER — Ambulatory Visit (HOSPITAL_COMMUNITY)
Admission: RE | Admit: 2019-08-12 | Discharge: 2019-08-12 | Disposition: A | Discharge status: Home / Self Care | Payer: Medicaid Other | Source: Ambulatory Visit | Attending: Vascular Surgery | Admitting: Vascular Surgery

## 2019-08-12 ENCOUNTER — Ambulatory Visit (INDEPENDENT_AMBULATORY_CARE_PROVIDER_SITE_OTHER)
Admission: RE | Admit: 2019-08-12 | Discharge: 2019-08-12 | Disposition: A | Discharge status: Home / Self Care | Payer: Medicaid Other | Source: Ambulatory Visit | Attending: Vascular Surgery | Admitting: Vascular Surgery

## 2019-08-12 ENCOUNTER — Other Ambulatory Visit: Payer: Self-pay

## 2019-08-12 DIAGNOSIS — I739 Peripheral vascular disease, unspecified: Secondary | ICD-10-CM | POA: Diagnosis not present

## 2019-08-14 ENCOUNTER — Other Ambulatory Visit: Payer: Self-pay

## 2019-08-14 ENCOUNTER — Ambulatory Visit (INDEPENDENT_AMBULATORY_CARE_PROVIDER_SITE_OTHER): Payer: Medicaid Other | Admitting: Vascular Surgery

## 2019-08-14 ENCOUNTER — Encounter: Payer: Self-pay | Admitting: Vascular Surgery

## 2019-08-14 VITALS — BP 162/109 | HR 99 | Temp 98.0°F | Resp 20 | Ht 64.0 in | Wt 156.0 lb

## 2019-08-14 DIAGNOSIS — I739 Peripheral vascular disease, unspecified: Secondary | ICD-10-CM | POA: Diagnosis not present

## 2019-08-14 NOTE — Progress Notes (Signed)
Patient ID: Brooke Chapman, female   DOB: 01-31-1960, 59 y.o.   MRN: [id number]  Reason for Consult: Post-op Follow-up   Referred by Ladell Pier, MD  Subjective:     HPI:  Brooke Chapman is a 59 y.o. female has bilateral lower extremity claudication symptoms.  Recent underwent stenting of her left common iliac artery.  Right lower extremity has bothered her the most.  She has been up walking states that she really has not had too much pain is able to perform her activities of daily living.  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  Risk factors include hyperlipidemia, hypertension and she is an everyday smoker.  She is here today with repeat ABIs and vein mapping.  She does not have tissue loss or ulceration.  Past Medical History:  Diagnosis Date   Hyperlipidemia    Hypertension    PAD (peripheral artery disease) (HCC)    Family History  Problem Relation Age of Onset   Heart disease Mother    Past Surgical History:  Procedure Laterality Date   ABDOMINAL AORTOGRAM W/LOWER EXTREMITY Bilateral 07/13/2019   Procedure: ABDOMINAL AORTOGRAM W/LOWER EXTREMITY;  Surgeon: Waynetta Sandy, MD;  Location: [facility];  Service: Cardiovascular;  Laterality: Bilateral;   BREAST EXCISIONAL BIOPSY Left    LESS THAN 5 YEARS AGO   PERIPHERAL VASCULAR INTERVENTION Left 07/13/2019   Procedure: PERIPHERAL VASCULAR INTERVENTION;  Surgeon: Waynetta Sandy, MD;  Location: [facility];  Service: Cardiovascular;  Laterality: Left;  common iliac    Short Social History:  Social History   Tobacco Use   Smoking status: Current Every Day Smoker    Packs/day: 0.50    Types: Cigarettes   Smokeless tobacco: Never Used   Tobacco comment: 6-10 cigs daily  Substance Use Topics   Alcohol use: Yes    Comment: rare    Allergies  Allergen Reactions   Latex Rash    Current Outpatient Medications  Medication Sig Dispense Refill    amLODipine (NORVASC) 10 MG tablet Take 1 tablet (10 mg total) by mouth daily. Must have office visit for refills 30 tablet 6   aspirin 81 MG chewable tablet Chew 81 mg by mouth daily.     atorvastatin (LIPITOR) 20 MG tablet Take 1.5 tablets (30 mg total) by mouth daily. 135 tablet 3   Blood Pressure Monitor DEVI Use as directed to check home blood pressure 2-3 times a week 1 Device 0   buPROPion (WELLBUTRIN SR) 150 MG 12 hr tablet Take 1 tablet (150 mg total) by mouth 2 (two) times daily. 60 tablet 1   cholecalciferol (VITAMIN D3) 25 MCG (1000 UT) tablet Take 1,000 Units by mouth daily.     cilostazol (PLETAL) 100 MG tablet Take 1 tablet (100 mg total) by mouth 2 (two) times daily. 180 tablet 3   clopidogrel (PLAVIX) 75 MG tablet Take 1 tablet (75 mg total) by mouth daily. 30 tablet 11   diclofenac Sodium (VOLTAREN) 1 % GEL Apply 2 g topically 4 (four) times daily. 100 g 2   No current facility-administered medications for this visit.    Review of Systems  Constitutional:  Constitutional negative. HENT: HENT negative.  Eyes: Eyes negative.  Cardiovascular: Positive for claudication and leg swelling.  GI: Gastrointestinal negative.  Musculoskeletal: Positive for leg pain.  Skin: Skin negative.  Neurological: Neurological negative. Hematologic: Hematologic/lymphatic negative.  Psychiatric: Psychiatric negative.  Objective:  Objective   Vitals:   08/14/19 1502  BP: (!) 162/109  Pulse: 99  Resp: 20  Temp: 98 F (36.7 C)  SpO2: 98%  Weight: 156 lb (70.8 kg)  Height: 5\' 4"  (1.626 m)   Body mass index is 26.78 kg/m.  Physical Exam HENT:     Head: Normocephalic.     Nose:     Comments: Wearing a mask  Cardiovascular:     Rate and Rhythm: Normal rate.     Pulses:          Radial pulses are 2+ on the right side and 2+ on the left side.       Femoral pulses are 2+ on the right side and 2+ on the left side.      Popliteal pulses are 0 on the right side and  0 on the left side.  Pulmonary:     Effort: Pulmonary effort is normal.  Abdominal:     General: Abdomen is flat.     Palpations: Abdomen is soft.  Musculoskeletal:        General: No swelling. Normal range of motion.     Cervical back: Normal range of motion.  Skin:    General: Skin is warm.     Capillary Refill: Capillary refill takes less than 2 seconds.  Neurological:     Mental Status: She is alert.  Psychiatric:        Mood and Affect: Mood normal.        Behavior: Behavior normal.        Thought Content: Thought content normal.        Judgment: Judgment normal.     Data: I have independently turbid her ABIs to be 0.49 right and monophasic with great toe pressure 34 and left 0.57 monophasic with great toe pressure 66  I have independent interpreted her vein mapping of her bilateral greater saphenous veins which does not demonstrate any suitable vein for bypass     Assessment/Plan:     59 year old female with bilateral lower extremity right greater than left claudication with known bilateral SFA disease.  ABIs are moderately decreased bilaterally right is worse than left which is consistent with her symptoms.  She does not have any tissue loss or ulceration.  Unfortunately does not have any vein for bypass.  She continues to smoke and we have discussed smoking cessation.  At this time given her mild symptoms we will hold on surgery have her follow-up in 6 months with repeat ABIs.     Waynetta Sandy MD [facility]

## 2019-08-19 ENCOUNTER — Other Ambulatory Visit: Payer: Self-pay | Admitting: *Deleted

## 2019-08-19 DIAGNOSIS — I739 Peripheral vascular disease, unspecified: Secondary | ICD-10-CM

## 2019-08-27 ENCOUNTER — Ambulatory Visit (HOSPITAL_BASED_OUTPATIENT_CLINIC_OR_DEPARTMENT_OTHER)
Admission: RE | Admit: 2019-08-27 | Discharge: 2019-08-27 | Disposition: A | Discharge status: Home / Self Care | Payer: Medicaid Other | Source: Ambulatory Visit | Attending: Internal Medicine | Admitting: Internal Medicine

## 2019-08-27 ENCOUNTER — Other Ambulatory Visit: Payer: Self-pay

## 2019-08-27 DIAGNOSIS — Z1231 Encounter for screening mammogram for malignant neoplasm of breast: Secondary | ICD-10-CM | POA: Diagnosis not present

## 2019-08-31 ENCOUNTER — Ambulatory Visit: Payer: Medicaid Other | Attending: Family | Admitting: Family

## 2019-08-31 ENCOUNTER — Other Ambulatory Visit: Payer: Self-pay

## 2019-08-31 DIAGNOSIS — N3941 Urge incontinence: Secondary | ICD-10-CM

## 2019-08-31 NOTE — Patient Instructions (Addendum)
Will try adult incontinence supplies. Follow-up with primary physician as needed.  Urinary Incontinence  Urinary incontinence refers to a condition in which a person is unable to control where and when to pass urine. A person with this condition will urinate when he or she does not mean to (involuntarily). What are the causes? This condition may be caused by:  Medicines.  Infections.  Constipation.  Overactive bladder muscles.  Weak bladder muscles.  Weak pelvic floor muscles. These muscles provide support for the bladder, intestine, and, in women, the uterus.  Enlarged prostate in men. The prostate is a gland near the bladder. When it gets too big, it can pinch the urethra. With the urethra blocked, the bladder can weaken and lose the ability to empty properly.  Surgery.  Emotional factors, such as anxiety, stress, or post-traumatic stress disorder (PTSD).  Pelvic organ prolapse. This happens in women when organs shift out of place and into the vagina. This shift can prevent the bladder and urethra from working properly. What increases the risk? The following factors may make you more likely to develop this condition:  Older age.  Obesity and physical inactivity.  Pregnancy and childbirth.  Menopause.  Diseases that affect the nerves or spinal cord (neurological diseases).  Long-term (chronic) coughing. This can increase pressure on the bladder and pelvic floor muscles. What are the signs or symptoms? Symptoms may vary depending on the type of urinary incontinence you have. They include:  A sudden urge to urinate, but passing urine involuntarily before you can get to a bathroom (urge incontinence).  Suddenly passing urine with any activity that forces urine to pass, such as coughing, laughing, exercise, or sneezing (stress incontinence).  Needing to urinate often, but urinating only a small amount, or constantly dribbling urine (overflow incontinence).  Urinating  because you cannot get to the bathroom in time due to a physical disability, such as arthritis or injury, or communication and thinking problems, such as Alzheimer disease (functional incontinence). How is this diagnosed? This condition may be diagnosed based on:  Your medical history.  A physical exam.  Tests, such as: ? Urine tests. ? X-rays of your kidney and bladder. ? Ultrasound. ? CT scan. ? Cystoscopy. In this procedure, a health care provider inserts a tube with a light and camera (cystoscope) through the urethra and into the bladder in order to check for problems. ? Urodynamic testing. These tests assess how well the bladder, urethra, and sphincter can store and release urine. There are different types of urodynamic tests, and they vary depending on what the test is measuring. To help diagnose your condition, your health care provider may recommend that you keep a log of when you urinate and how much you urinate. How is this treated? Treatment for this condition depends on the type of incontinence that you have and its cause. Treatment may include:  Lifestyle changes, such as: ? Quitting smoking. ? Maintaining a healthy weight. ? Staying active. Try to get 150 minutes of moderate-intensity exercise every week. Ask your health care provider which activities are safe for you. ? Eating a healthy diet.  Avoid high-fat foods, like fried foods.  Avoid refined carbohydrates like white bread and white rice.  Limit how much alcohol and caffeine you drink.  Increase your fiber intake. Foods such as fresh fruits, vegetables, beans, and whole grains are healthy sources of fiber.  Pelvic floor muscle exercises.  Bladder training, such as lengthening the amount of time between bathroom breaks, or using  the bathroom at regular intervals.  Using techniques to suppress bladder urges. This can include distraction techniques or controlled breathing exercises.  Medicines to relax the  bladder muscles and prevent bladder spasms.  Medicines to help slow or prevent the growth of a man's prostate.  Botox injections. These can help relax the bladder muscles.  Using pulses of electricity to help change bladder reflexes (electrical nerve stimulation).  For women, using a medical device to prevent urine leaks. This is a small, tampon-like, disposable device that is inserted into the urethra.  Injecting collagen or carbon beads (bulking agents) into the urinary sphincter. These can help thicken tissue and close the bladder opening.  Surgery. Follow these instructions at home: Lifestyle  Limit alcohol and caffeine. These can fill your bladder quickly and irritate it.  Keep yourself clean to help prevent odors and skin damage. Ask your doctor about special skin creams and cleansers that can protect the skin from urine.  Consider wearing pads or adult diapers. Make sure to change them regularly, and always change them right after experiencing incontinence. General instructions  Take over-the-counter and prescription medicines only as told by your health care provider.  Use the bathroom about every 3-4 hours, even if you do not feel the need to urinate. Try to empty your bladder completely every time. After urinating, wait a minute. Then try to urinate again.  Make sure you are in a relaxed position while urinating.  If your incontinence is caused by nerve problems, keep a log of the medicines you take and the times you go to the bathroom.  Keep all follow-up visits as told by your health care provider. This is important. Contact a health care provider if:  You have pain that gets worse.  Your incontinence gets worse. Get help right away if:  You have a fever or chills.  You are unable to urinate.  You have redness in your groin area or down your legs. Summary  Urinary incontinence refers to a condition in which a person is unable to control where and when to pass  urine.  This condition may be caused by medicines, infection, weak bladder muscles, weak pelvic floor muscles, enlargement of the prostate (in men), or surgery.  The following factors increase your risk for developing this condition: older age, obesity, pregnancy and childbirth, menopause, neurological diseases, and chronic coughing.  There are several types of urinary incontinence. They include urge incontinence, stress incontinence, overflow incontinence, and functional incontinence.  This condition is usually treated first with lifestyle and behavioral changes, such as quitting smoking, eating a healthier diet, and doing regular pelvic floor exercises. Other treatment options include medicines, bulking agents, medical devices, electrical nerve stimulation, or surgery. This information is not intended to replace advice given to you by your health care provider. Make sure you discuss any questions you have with your health care provider. Document Revised: 01/25/2017 Document Reviewed: 04/26/2016 Elsevier Patient Education  [place].

## 2019-08-31 NOTE — Progress Notes (Signed)
Virtual Visit via Telephone Note  I connected with Brooke Chapman, on 08/31/2019 at 9:04 AM by telephone due to the COVID-19 pandemic and verified that I am speaking with the correct person using two identifiers.  Due to current restrictions/limitations of in-office visits due to the COVID-19 pandemic, this scheduled clinical appointment was converted to a telehealth visit.    Consent: I discussed the limitations, risks, security and privacy concerns of performing an evaluation and management service by telephone and the availability of in person appointments. I also discussed with the patient that there may be a patient responsible charge related to this service. The patient expressed understanding and agreed to proceed.  Location of Patient: Home  Location of Provider: [facility]  Persons participating in Telemedicine visit: [place], NP Orlan Leavens, CMA  History of Present Illness: Brooke Chapman is a 59 year old female with history of peripheral artery disease, hypertension, peripheral vascular disease, porokeratosis, tobacco dependence, and hyperlipidemia who presents for incontinence.   1. INCONTINENCE:  During the last three months, have you leaked urine (even if a small amount)?  No  2. During the last three months, did you leak urine (check all that apply): Reports she usually does not leak urine. Reports she does have an urge to urinate and if she does not get to a toilet soon she knows will void on herself. Denies leaking urine while performing physical activity, such as coughing, sneezing, lifting, and exercise.   Reports she has urges to void during nighttime (especially after midnight) and that this interrupts her sleep. Reports she does not drink water after 6:00 PM in hopes of decreasing the need to get up throughout the night. Reports this does not help as she still has to get up throughout the night to urinate and this makes  it difficult for her to return to sleep. Reports this has been happening for many years. Requests she would like to try incontinence supplies.   Past Medical History:  Diagnosis Date  . Hyperlipidemia   . Hypertension   . PAD (peripheral artery disease) (HCC)    Allergies  Allergen Reactions  . Latex Rash    Current Outpatient Medications on File Prior to Visit  Medication Sig Dispense Refill  . amLODipine (NORVASC) 10 MG tablet Take 1 tablet (10 mg total) by mouth daily. Must have office visit for refills 30 tablet 6  . aspirin 81 MG chewable tablet Chew 81 mg by mouth daily.    Marland Kitchen atorvastatin (LIPITOR) 20 MG tablet Take 1.5 tablets (30 mg total) by mouth daily. 135 tablet 3  . Blood Pressure Monitor DEVI Use as directed to check home blood pressure 2-3 times a week 1 Device 0  . buPROPion (WELLBUTRIN SR) 150 MG 12 hr tablet Take 1 tablet (150 mg total) by mouth 2 (two) times daily. 60 tablet 1  . cholecalciferol (VITAMIN D3) 25 MCG (1000 UT) tablet Take 1,000 Units by mouth daily.    . cilostazol (PLETAL) 100 MG tablet Take 1 tablet (100 mg total) by mouth 2 (two) times daily. 180 tablet 3  . clopidogrel (PLAVIX) 75 MG tablet Take 1 tablet (75 mg total) by mouth daily. 30 tablet 11  . diclofenac Sodium (VOLTAREN) 1 % GEL Apply 2 g topically 4 (four) times daily. 100 g 2   No current facility-administered medications on file prior to visit.    Observations/Objective: Alert and oriented x 3. Not in acute distress. Physical examination  not completed as this is a telemedicine visit.  Assessment and Plan: 1. Urge incontinence: - Patient with symptoms of urge incontinence. Reports this has been occurring for many years. Requesting incontinence supplies.  - Symptoms most consistent with urge incontinence. - Will submit required Incontinence Order Form.  - Avoid caffeine in the evening.  - Avoid constipation.  - Plan fluid intake earlier in the day to prevent sleep interruption.  -  Scheduled voiding every 2 - 3 hours.  - May practice Kegel Exercises. While urinating try to stop the flow of urine, start and stop as often as possible. Repeatedly contract as if you were stopping your urine, perform while not urinating. Tighten rectum as if avoiding to pass gas, contract the anus but do not move buttocks. - Counseled patient to follow-up with primary physician as needed.   Follow Up Instructions: Follow-up with primary physician as needed.   Patient was given clear instructions to go to Emergency Department or return to medical center if symptoms don't improve, worsen, or new problems develop.The patient verbalized understanding.  I discussed the assessment and treatment plan with the patient. The patient was provided an opportunity to ask questions and all were answered. The patient agreed with the plan and demonstrated an understanding of the instructions.   The patient was advised to call back or seek an in-person evaluation if the symptoms worsen or if the condition fails to improve as anticipated.   I provided 12 minutes total of non-face-to-face time during this encounter including median intraservice time, reviewing previous notes, labs, imaging, medications, management and patient verbalized understanding.    Camillia Herter, NP  [facility] [place], [place]   08/31/2019, 7:35 AM

## 2019-09-02 DIAGNOSIS — R32 Unspecified urinary incontinence: Secondary | ICD-10-CM | POA: Diagnosis not present

## 2019-11-02 ENCOUNTER — Ambulatory Visit: Payer: Medicaid Other | Attending: Internal Medicine | Admitting: Internal Medicine

## 2019-11-02 ENCOUNTER — Other Ambulatory Visit: Payer: Self-pay

## 2019-11-02 DIAGNOSIS — E782 Mixed hyperlipidemia: Secondary | ICD-10-CM

## 2019-11-02 DIAGNOSIS — F17200 Nicotine dependence, unspecified, uncomplicated: Secondary | ICD-10-CM | POA: Diagnosis not present

## 2019-11-02 DIAGNOSIS — K5901 Slow transit constipation: Secondary | ICD-10-CM | POA: Diagnosis not present

## 2019-11-02 DIAGNOSIS — Z2821 Immunization not carried out because of patient refusal: Secondary | ICD-10-CM

## 2019-11-02 DIAGNOSIS — I10 Essential (primary) hypertension: Secondary | ICD-10-CM

## 2019-11-02 NOTE — Progress Notes (Signed)
Virtual Visit via Telephone Note Due to current restrictions/limitations of in-office visits due to the COVID-19 pandemic, this scheduled clinical appointment was converted to a telehealth visit  I connected with Brooke Chapman on 11/02/19 at 9:30 a.m by telephone and verified that I am speaking with the correct person using two identifiers.  I am in my office.  The patient is at work.  Only the patient, CMA Sallyanne Havers and myself participated in this encounter.  I discussed the limitations, risks, security and privacy concerns of performing an evaluation and management service by telephone and the availability of in person appointments. I also discussed with the patient that there may be a patient responsible charge related to this service. The patient expressed understanding and agreed to proceed.   History of Present Illness: Patient with history of PAD left leg, HTN, tobacco dependence, HL, urge incont.  HM: she has not received the COVID vaccine.  She decided against both the COVID and flu vaccine.  When asked why she said "I just don't want too."  We did her Pap smear on last visit with me.  Pap was negative but HPV was positive.  HPV subtyping was negative.  She will be due for Pap again in 1 year.  HYPERTENSION Currently taking: see medication list Med Adherence: [x]  Yes.  She takes Amlodipine at nights but admits that she misses 1-2 nights a week if she falls a sleep before taking the medication Medication side effects: []  Yes    [x]  No Adherence with salt restriction: [x]  Yes    []  No Home Monitoring?: []  Yes    [x]  No Monitoring Frequency: []  Yes    []  No Home BP results range: []  Yes    []  No SOB? []  Yes    [x]  No Chest Pain?: []  Yes    [x]  No Leg swelling?: [x]  Yes, occasionally in ankles   []  No Headaches?: []  Yes    [x]  No Dizziness? []  Yes    [x]  No Comments:   HL:  Taking and tolerating Lipitor  Feels one of her meds may be causing constipation at times.  Not going as  often as she use it.  Stools are not hard.   Tob Dep:  Still smoking 1/2 pk a day.  Not ready to give trail of quitting  Outpatient Encounter Medications as of 11/02/2019  Medication Sig Note  . amLODipine (NORVASC) 10 MG tablet Take 1 tablet (10 mg total) by mouth daily. Must have office visit for refills   . aspirin 81 MG chewable tablet Chew 81 mg by mouth daily.   Marland Kitchen atorvastatin (LIPITOR) 20 MG tablet Take 1.5 tablets (30 mg total) by mouth daily.   . Blood Pressure Monitor DEVI Use as directed to check home blood pressure 2-3 times a week   . buPROPion (WELLBUTRIN SR) 150 MG 12 hr tablet Take 1 tablet (150 mg total) by mouth 2 (two) times daily. 07/03/2019: Pt has not started med   . cholecalciferol (VITAMIN D3) 25 MCG (1000 UT) tablet Take 1,000 Units by mouth daily.   . cilostazol (PLETAL) 100 MG tablet Take 1 tablet (100 mg total) by mouth 2 (two) times daily.   . clopidogrel (PLAVIX) 75 MG tablet Take 1 tablet (75 mg total) by mouth daily.   . diclofenac Sodium (VOLTAREN) 1 % GEL Apply 2 g topically 4 (four) times daily.    No facility-administered encounter medications on file as of 11/02/2019.      Observations/Objective:  Chemistry      Component Value Date/Time   NA 142 07/13/2019 0740   NA 142 05/11/2019 1534   K 3.8 07/13/2019 0740   CL 100 07/13/2019 0740   CO2 23 05/11/2019 1534   BUN 8 07/13/2019 0740   BUN 13 05/11/2019 1534   CREATININE 0.90 07/13/2019 0740      Component Value Date/Time   CALCIUM 9.2 05/11/2019 1534   ALKPHOS 111 05/11/2019 1534   AST 21 05/11/2019 1534   ALT 9 05/11/2019 1534   BILITOT <0.2 05/11/2019 1534     Lab Results  Component Value Date   WBC 8.2 05/11/2019   HGB 14.6 07/13/2019   HCT 43.0 07/13/2019   MCV 92 05/11/2019   PLT 293 05/11/2019   Lab Results  Component Value Date   CHOL 184 05/11/2019   HDL 64 05/11/2019   LDLCALC 95 05/11/2019   TRIG 142 05/11/2019   CHOLHDL 2.9 05/11/2019     Assessment and Plan: 1.  Essential hypertension Continue amlodipine 10 mg daily.  Continue low-salt diet.  2. Mixed hyperlipidemia Continue atorvastatin 30 mg daily.  Plan to recheck lipid profile on next visit to see whether LDL is at goal of 70 or lower.  3. Tobacco dependence Advised to quit.  Patient not ready to give a trial of quitting.  She is aware of the health risks associated with smoking.  Less than 5 minutes spent on counseling.  4. Slow transit constipation I told patient that sometimes calcium channel blockers of which amlodipine is in the class can cause constipation.  Recommend increasing fiber in the diet through eating fruits and vegetables.  If this fails she can purchase Citrucel or MiraLAX over-the-counter and use as needed.  5. COVID-19 vaccination declined Discussed the relative safety and effectiveness of COVID-19 vaccines.  Encouraged her to get the vaccine.  Patient declined.  6. Influenza vaccination declined This was offered and recommended.  Patient declined.   Follow Up Instructions: 4 mths   I discussed the assessment and treatment plan with the patient. The patient was provided an opportunity to ask questions and all were answered. The patient agreed with the plan and demonstrated an understanding of the instructions.   The patient was advised to call back or seek an in-person evaluation if the symptoms worsen or if the condition fails to improve as anticipated.  I provided 12 minutes of non-face-to-face time during this encounter.   Karle Plumber, MD

## 2019-11-11 ENCOUNTER — Other Ambulatory Visit: Payer: Self-pay | Admitting: Internal Medicine

## 2019-11-11 DIAGNOSIS — I739 Peripheral vascular disease, unspecified: Secondary | ICD-10-CM

## 2019-11-11 NOTE — Telephone Encounter (Signed)
Requested Prescriptions  Pending Prescriptions Disp Refills   cilostazol (PLETAL) 100 MG tablet [Pharmacy Med Name: Cilostazol 100 MG Oral Tablet] 180 tablet 0    Sig: Take 1 tablet by mouth twice daily     Hematology: Antiplatelets - cilostazol Failed - 11/11/2019  6:42 AM      Failed - PLT in normal range and within 180 days    Platelets  Date Value Ref Range Status  05/11/2019 293 150 - 450 x10E3/uL Final         Failed - WBC in normal range and within 180 days    WBC  Date Value Ref Range Status  05/11/2019 8.2 3.4 - 10.8 x10E3/uL Final  04/10/2018 6.0 4.0 - 10.5 K/uL Final         Passed - HCT in normal range and within 180 days    HCT  Date Value Ref Range Status  07/13/2019 43.0 36 - 46 % Final   Hematocrit  Date Value Ref Range Status  05/11/2019 40.5 34.0 - 46.6 % Final         Passed - HGB in normal range and within 180 days    Hemoglobin  Date Value Ref Range Status  07/13/2019 14.6 12.0 - 15.0 g/dL Final  05/11/2019 13.3 11.1 - 15.9 g/dL Final         Passed - Valid encounter within last 6 months    Recent Outpatient Visits          1 week ago Essential hypertension   [place], Deborah B, MD   2 months ago Urge incontinence   [place], [place] J, NP   4 months ago Pap smear for cervical cancer screening   [place], Deborah B, MD   6 months ago Essential hypertension   [place], Deborah B, MD   1 year ago Essential hypertension   [place], MD      Future Appointments            In 3 months Wynetta Emery Dalbert Batman, MD [place]           Patient has upcoming appt scheduled in 3 months.

## 2019-11-19 ENCOUNTER — Ambulatory Visit: Payer: Self-pay

## 2019-11-19 NOTE — Telephone Encounter (Signed)
°  Pt. Reports yesterday she started having heart racing and palpitations. No chest pain or dizziness. Lasts a few seconds. No availability in the practice per Peter Congo.Pt. will go to [facility]. Answer Assessment - Initial Assessment Questions 1. DESCRIPTION: "Please describe your heart rate or heartbeat that you are having" (e.g., fast/slow, regular/irregular, skipped or extra beats, "palpitations")     Racing 2. ONSET: "When did it start?" (Minutes, hours or days)      Yesterday 3. DURATION: "How long does it last" (e.g., seconds, minutes, hours)     Lasts seconds 4. PATTERN "Does it come and go, or has it been constant since it started?"  "Does it get worse with exertion?"   "Are you feeling it now?"     Comes and goes 5. TAP: "Using your hand, can you tap out what you are feeling on a chair or table in front of you, so that I can hear?" (Note: not all patients can do this)       No 6. HEART RATE: "Can you tell me your heart rate?" "How many beats in 15 seconds?"  (Note: not all patients can do this)       100 7. RECURRENT SYMPTOM: "Have you ever had this before?" If Yes, ask: "When was the last time?" and "What happened that time?"      Yes -  8. CAUSE: "What do you think is causing the palpitations?"     Unsure 9. CARDIAC HISTORY: "Do you have any history of heart disease?" (e.g., heart attack, angina, bypass surgery, angioplasty, arrhythmia)      No 10. OTHER SYMPTOMS: "Do you have any other symptoms?" (e.g., dizziness, chest pain, sweating, difficulty breathing)       No 11. PREGNANCY: "Is there any chance you are pregnant?" "When was your last menstrual period?"       No  Protocols used: HEART RATE AND HEARTBEAT QUESTIONS-A-AH

## 2019-11-20 ENCOUNTER — Telehealth: Payer: Self-pay | Admitting: Internal Medicine

## 2019-11-20 NOTE — Telephone Encounter (Signed)
Contacted pt and went over Dr. Wynetta Emery response pt states she understands has been scheduled a follow up for Thursday at 1050am

## 2019-11-20 NOTE — Telephone Encounter (Signed)
Copied from [place] [id number]. Topic: General - Other >> Nov 19, 2019  9:00 AM Alanda Slim E wrote: Reason for CRM: Pt called to speak with Dr. Wynetta Emery Pt states with slight movement her heart races rapidly / please advise

## 2019-11-20 NOTE — Telephone Encounter (Signed)
Will forward to pcp

## 2019-11-26 ENCOUNTER — Other Ambulatory Visit: Payer: Self-pay

## 2019-11-26 ENCOUNTER — Ambulatory Visit: Payer: Medicaid Other | Attending: Internal Medicine | Admitting: Family

## 2019-11-26 ENCOUNTER — Encounter: Payer: Self-pay | Admitting: Family

## 2019-11-26 VITALS — BP 153/105 | HR 90 | Resp 16 | Wt 147.4 lb

## 2019-11-26 DIAGNOSIS — D509 Iron deficiency anemia, unspecified: Secondary | ICD-10-CM | POA: Diagnosis not present

## 2019-11-26 DIAGNOSIS — Z79899 Other long term (current) drug therapy: Secondary | ICD-10-CM | POA: Diagnosis not present

## 2019-11-26 DIAGNOSIS — I10 Essential (primary) hypertension: Secondary | ICD-10-CM | POA: Diagnosis not present

## 2019-11-26 DIAGNOSIS — F17210 Nicotine dependence, cigarettes, uncomplicated: Secondary | ICD-10-CM | POA: Insufficient documentation

## 2019-11-26 DIAGNOSIS — Z7982 Long term (current) use of aspirin: Secondary | ICD-10-CM | POA: Diagnosis not present

## 2019-11-26 DIAGNOSIS — R002 Palpitations: Secondary | ICD-10-CM | POA: Diagnosis not present

## 2019-11-26 DIAGNOSIS — Z131 Encounter for screening for diabetes mellitus: Secondary | ICD-10-CM | POA: Diagnosis not present

## 2019-11-26 DIAGNOSIS — Z7182 Exercise counseling: Secondary | ICD-10-CM | POA: Insufficient documentation

## 2019-11-26 DIAGNOSIS — Q828 Other specified congenital malformations of skin: Secondary | ICD-10-CM | POA: Diagnosis not present

## 2019-11-26 DIAGNOSIS — R634 Abnormal weight loss: Secondary | ICD-10-CM

## 2019-11-26 DIAGNOSIS — I739 Peripheral vascular disease, unspecified: Secondary | ICD-10-CM | POA: Diagnosis not present

## 2019-11-26 DIAGNOSIS — Z713 Dietary counseling and surveillance: Secondary | ICD-10-CM | POA: Diagnosis not present

## 2019-11-26 DIAGNOSIS — E785 Hyperlipidemia, unspecified: Secondary | ICD-10-CM | POA: Diagnosis not present

## 2019-11-26 DIAGNOSIS — Z6825 Body mass index (BMI) 25.0-25.9, adult: Secondary | ICD-10-CM | POA: Diagnosis not present

## 2019-11-26 MED ORDER — CARVEDILOL 3.125 MG PO TABS: 3.1250 mg | ORAL_TABLET | Freq: Two times a day (BID) | ORAL | 1 refills | Status: DC

## 2019-11-26 NOTE — Progress Notes (Signed)
Patient ID: Brooke Chapman, female    DOB: 08-01-1960  MRN: [id number]  CC: Tachycardia  Subjective: Brooke Chapman is a 59 y.o. female with history of peripheral artery disease, hypertension, peripheral vascular disease, porokeratosis, tobacco dependence, and hyperlipidemia who presents for tachycardia.  1. PALPITATIONS: Duration: weeks Symptom description: chest feels like it about to open, can feel it in neck, and feel it in eyes Duration of episode: seconds Frequency: every other day and happens about 4 times daily  Activity when event occurred: varies Related to exertion: no Dyspnea: no Chest pain: yes Syncope: dizziness and sometimes feels like she may pass out  Anxiety/stress: no Nausea/vomiting: no Diaphoresis: no Thyroid disease: no Caffeine intake: 1 cafienated beverage Status:  stable Treatments attempted:none  Reports she did take Amlodipine this morning around 6 am, endorses taking everyday without missing doses, and does not check blood pressure at home because she does not have a monitor. Denies shortness of breath, shoulder pain, back pain, and abdominal pain. Denies any recent changes in prescribed medications, over-the-counter medications, and herbal medications. Says she has been unintentionally losing weight. Diet primarily consists of fruit, vegetables, dairy, chicken, and fish.  Patient Active Problem List   Diagnosis Date Noted  . 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 PVD (peripheral vascular disease) ([place]) 08/26/2018  . Hyperlipidemia 05/02/2018  . HTN (hypertension) 05/01/2018  . Tobacco dependence 05/01/2018  . PAD (peripheral artery disease) ([place])      Current Outpatient Medications on File Prior to Visit  Medication Sig Dispense Refill  . amLODipine (NORVASC) 10 MG tablet Take 1 tablet (10 mg total) by mouth daily. Must have office visit for refills 30 tablet 6  . aspirin 81 MG chewable tablet Chew 81 mg by mouth daily.    Marland Kitchen atorvastatin (LIPITOR) 20 MG  tablet Take 1.5 tablets (30 mg total) by mouth daily. 135 tablet 3  . Blood Pressure Monitor DEVI Use as directed to check home blood pressure 2-3 times a week 1 Device 0  . buPROPion (WELLBUTRIN SR) 150 MG 12 hr tablet Take 1 tablet (150 mg total) by mouth 2 (two) times daily. 60 tablet 1  . cholecalciferol (VITAMIN D3) 25 MCG (1000 UT) tablet Take 1,000 Units by mouth daily.    . cilostazol (PLETAL) 100 MG tablet Take 1 tablet by mouth twice daily 180 tablet 0  . clopidogrel (PLAVIX) 75 MG tablet Take 1 tablet (75 mg total) by mouth daily. 30 tablet 11  . diclofenac Sodium (VOLTAREN) 1 % GEL Apply 2 g topically 4 (four) times daily. 100 g 2   No current facility-administered medications on file prior to visit.    Allergies  Allergen Reactions  . Latex Rash    Social History   Socioeconomic History  . Marital status: Single    Spouse name: Not on file  . Number of children: Not on file  . Years of education: Not on file  . Highest education level: Not on file  Occupational History  . Not on file  Tobacco Use  . Smoking status: Current Every Day Smoker    Packs/day: 0.50    Types: Cigarettes  . Smokeless tobacco: Never Used  . Tobacco comment: 6-10 cigs daily  Vaping Use  . Vaping Use: Never used  Substance and Sexual Activity  . Alcohol use: Yes    Comment: rare  . Drug use: Never  . Sexual activity: Not Currently  Other Topics Concern  . Not on file  Social History Narrative  .  Not on file   Social Determinants of Health   Financial Resource Strain:   . Difficulty of Paying Living Expenses: Not on file  Food Insecurity:   . Worried About Charity fundraiser in the Last Year: Not on file  . Ran Out of Food in the Last Year: Not on file  Transportation Needs:   . Lack of Transportation (Medical): Not on file  . Lack of Transportation (Non-Medical): Not on file  Physical Activity:   . Days of Exercise per Week: Not on file  . Minutes of Exercise per Session: Not  on file  Stress:   . Feeling of Stress : Not on file  Social Connections:   . Frequency of Communication with Friends and Family: Not on file  . Frequency of Social Gatherings with Friends and Family: Not on file  . Attends Religious Services: Not on file  . Active Member of Clubs or Organizations: Not on file  . Attends Archivist Meetings: Not on file  . Marital Status: Not on file  Intimate Partner Violence:   . Fear of Current or Ex-Partner: Not on file  . Emotionally Abused: Not on file  . Physically Abused: Not on file  . Sexually Abused: Not on file    Family History  Problem Relation Age of Onset  . Heart disease Mother     Past Surgical History:  Procedure Laterality Date  . ABDOMINAL AORTOGRAM W/LOWER EXTREMITY Bilateral 07/13/2019   Procedure: ABDOMINAL AORTOGRAM W/LOWER EXTREMITY;  Surgeon: Waynetta Sandy, MD;  Location: [facility];  Service: Cardiovascular;  Laterality: Bilateral;  . BREAST EXCISIONAL BIOPSY Left    LESS THAN 5 YEARS AGO  . PERIPHERAL VASCULAR INTERVENTION Left 07/13/2019   Procedure: PERIPHERAL VASCULAR INTERVENTION;  Surgeon: Waynetta Sandy, MD;  Location: [facility];  Service: Cardiovascular;  Laterality: Left;  common iliac    ROS: Review of Systems Negative except as stated above  PHYSICAL EXAM: BP (!) 153/105 (BP Location: Right Arm, Patient Position: Sitting)   Pulse 90   Resp 16   Wt 147 lb 6.4 oz (66.9 kg)   SpO2 100%   BMI 25.30 kg/m    Wt Readings from Last 3 Encounters:  11/26/19 147 lb 6.4 oz (66.9 kg)  08/14/19 156 lb (70.8 kg)  07/13/19 170 lb (77.1 kg)   Physical Exam General appearance - alert, anxious, and in no distress Mental status - alert oriented to person, place, and time; anxious mood; behavior, speech, dress, motor activity, and thought processes normal Neck - supple, no significant adenopathy, carotid bruit noted on the left side Lymphatics - no palpable  lymphadenopathy, no hepatosplenomegaly Chest - clear to auscultation, no wheezes, rales or rhonchi, symmetric air entry, no tachypnea, retractions or cyanosis Heart - normal rate, regular rhythm, normal S1, S2, no murmurs, rubs, clicks or gallops Abdomen - soft, nontender, nondistended, no masses or organomegaly Neurological - alert, oriented, normal speech, no focal findings or movement disorder noted, neck supple without rigidity, cranial nerves II through XII intact, normal muscle tone, no tremors, strength 5/5 Musculoskeletal - no joint tenderness, deformity or swelling Extremities - peripheral pulses normal, no pedal edema, no clubbing or cyanosis, bilateral peripheral pulses normal  ASSESSMENT AND PLAN: 1. Palpitations: - Occurring for the past 2 weeks intermittently at least four times daily. Not related to any specific activity.  - Today EKG resulted with normal sinus rhythm and possible left atrial enlargement. Previous EKG on 07/13/2019 resulted  normal sinus rhythm.  - TSH to check thyroid function.  - Referral to Cardiology for further evaluation and management. - Patient was given clear instructions to go to Emergency Department or return to medical center if symptoms don't improve, worsen, or new problems develop.The patient verbalized understanding. - TSH - EKG 12-Lead - Ambulatory referral to Cardiology  2. Essential hypertension: - Blood pressure not at goal during today's visit. - Continue Amlodipine as prescribed.  - Begin Carvedilol as prescribed for high blood pressure. This may also assist with palpitations and increased heart rate.  - Follow-up with in 1 week with clinical pharmacist for blood pressure check. Write down your blood pressure readings each day and bring those results along with your home blood pressure monitor to your appointment. Medications may be adjusted at that time if needed. - CBC to check blood count. - Counseled on blood pressure goal of less than  130/80, low-sodium, DASH diet, medication compliance, 150 minutes of moderate intensity exercise per week as tolerated. Discussed medication compliance, adverse effects. - Follow-up with primary physician in 1 month for in-person visit or sooner if needed. - carvedilol (COREG) 3.125 MG tablet; Take 1 tablet (3.125 mg total) by mouth 2 (two) times daily with a meal.  Dispense: 60 tablet; Refill: 1 - CBC  3. Weight loss: - Patient with unintentional weight loss of 23 pounds since June 2021.  - Patient says she is not too concerned about this as her diet primarily consists of fruit, vegetables, dairy, chicken, and fish. Usually eating once or twice daily. - TSH obtained today to check thyroid function.  - Follow-up with primary physician in 1 month or sooner if needed for further evaluation and management.  4. Diabetes mellitus screening: - Hemoglobin A1C for screening of pre-diabetes/diabetes. - Hemoglobin A1c  Patient was given the opportunity to ask questions.  Patient verbalized understanding of the plan and was able to repeat key elements of the plan. Patient was given clear instructions to go to Emergency Department or return to medical center if symptoms don't improve, worsen, or new problems develop.The patient verbalized understanding.   Orders Placed This Encounter  Procedures  . TSH  . CBC  . Hemoglobin A1c  . Ambulatory referral to Cardiology     Requested Prescriptions   Signed Prescriptions Disp Refills  . carvedilol (COREG) 3.125 MG tablet 60 tablet 1    Sig: Take 1 tablet (3.125 mg total) by mouth 2 (two) times daily with a meal.    Return in about 4 weeks (around 12/24/2019) for Dr. Wynetta Emery and 1 week for BP check with [facility].  Camillia Herter, NP

## 2019-11-26 NOTE — Patient Instructions (Addendum)
EKG and lab today. Begin Carvedilol for high blood pressure and keep taking Amlodipine as well. Referral to Cardiology. Follow-up with primary physician in 1 month. Follow-up with clinical pharmacist in 1 week for blood pressure check. Palpitations Palpitations are feelings that your heartbeat is not normal. Your heartbeat may feel like it is:  Uneven.  Faster than normal.  Fluttering.  Skipping a beat. This is usually not a serious problem. In some cases, you may need tests to rule out any serious problems. Follow these instructions at home: Pay attention to any changes in your condition. Take these actions to help manage your symptoms: Eating and drinking  Avoid: ? Coffee, tea, soft drinks, and energy drinks. ? Chocolate. ? Alcohol. ? Diet pills. Lifestyle   Try to lower your stress. These things can help you relax: ? Yoga. ? Deep breathing and meditation. ? Exercise. ? Using words and images to create positive thoughts (guided imagery). ? Using your mind to control things in your body (biofeedback).  Do not use drugs.  Get plenty of rest and sleep. Keep a regular bed time. General instructions   Take over-the-counter and prescription medicines only as told by your doctor.  Do not use any products that contain nicotine or tobacco, such as cigarettes and e-cigarettes. If you need help quitting, ask your doctor.  Keep all follow-up visits as told by your doctor. This is important. You may need more tests if palpitations do not go away or get worse. Contact a doctor if:  Your symptoms last more than 24 hours.  Your symptoms occur more often. Get help right away if you:  Have chest pain.  Feel short of breath.  Have a very bad headache.  Feel dizzy.  Pass out (faint). Summary  Palpitations are feelings that your heartbeat is uneven or faster than normal. It may feel like your heart is fluttering or skipping a beat.  Avoid food and drinks that may cause  palpitations. These include caffeine, chocolate, and alcohol.  Try to lower your stress. Do not smoke or use drugs.  Get help right away if you faint or have chest pain, shortness of breath, a severe headache, or dizziness. This information is not intended to replace advice given to you by your health care provider. Make sure you discuss any questions you have with your health care provider. Document Revised: 02/27/2017 Document Reviewed: 02/27/2017 Elsevier Patient Education  2020 [employer].

## 2019-11-27 LAB — TSH: TSH: 0.755 u[IU]/mL (ref 0.450–4.500)

## 2019-11-27 LAB — CBC
Hematocrit: 29.9 % — ABNORMAL LOW (ref 34.0–46.6)
Hemoglobin: 9 g/dL — ABNORMAL LOW (ref 11.1–15.9)
MCH: 23.4 pg — ABNORMAL LOW (ref 26.6–33.0)
MCHC: 30.1 g/dL — ABNORMAL LOW (ref 31.5–35.7)
MCV: 78 fL — ABNORMAL LOW (ref 79–97)
Platelets: 403 10*3/uL (ref 150–450)
RBC: 3.84 x10E6/uL (ref 3.77–5.28)
RDW: 17.2 % — ABNORMAL HIGH (ref 11.7–15.4)
WBC: 7.1 10*3/uL (ref 3.4–10.8)

## 2019-11-27 LAB — HEMOGLOBIN A1C
Est. average glucose Bld gHb Est-mCnc: 120 mg/dL
Hgb A1c MFr Bld: 5.8 % — ABNORMAL HIGH (ref 4.8–5.6)

## 2019-11-30 ENCOUNTER — Telehealth: Payer: Self-pay | Admitting: Internal Medicine

## 2019-11-30 NOTE — Addendum Note (Signed)
Addended by: Camillia Herter on: 11/30/2019 09:02 AM   Modules accepted: Orders

## 2019-11-30 NOTE — Telephone Encounter (Signed)
PC placed to pt this evening.  I left message on her VM informing of who I am and that I was calling because I have some concerns about her recent blood test.  I will try to reach her again in the a.m. Pt with new microcytic anemia and unexplained wgh loss. Will need to start iron supplement and refer to GI .  I need to speak with her to determine if she has been having any abdominal pains or black stools.  May have to hold Plavix.   I will try to reach her again tomorrow.

## 2019-11-30 NOTE — Progress Notes (Signed)
Please call patient with update.   Thyroid normal.  Iron panel ordered for anemia.  Hemoglobin A1C which screens for diabetes is consistent with pre-diabetes. Important to practice healthy eating habits, low-carbohydrate diet, low-sugar diet, and regular aerobic exercise (at least 150 minutes a week as tolerated) to achieve or maintain control of pre-diabetes. Follow-up with primary physician at next visit.  Keep all scheduled appointments with primary physician.

## 2019-12-01 ENCOUNTER — Telehealth: Payer: Self-pay | Admitting: Internal Medicine

## 2019-12-01 DIAGNOSIS — D509 Iron deficiency anemia, unspecified: Secondary | ICD-10-CM

## 2019-12-01 DIAGNOSIS — R634 Abnormal weight loss: Secondary | ICD-10-CM

## 2019-12-01 DIAGNOSIS — K921 Melena: Secondary | ICD-10-CM

## 2019-12-01 MED ORDER — FERROUS SULFATE 325 (65 FE) MG PO TABS: 325.0000 mg | ORAL_TABLET | Freq: Two times a day (BID) | ORAL | 1 refills | Status: DC

## 2019-12-01 NOTE — Telephone Encounter (Signed)
Phone call placed to patient this a.m.  Patient informed that I have reviewed the results of her recent blood test.  She has new onset microcytic anemia that I suspect is iron deficiency.  She denies any abdominal pain or blood in the stools.  However she reports that she has noted black stools for the past 2 months.  She continues to have palpitations and feel weak at times.  She has had some weight loss over the past several months.  I informed patient that I am concerned that she may have bleeding somewhere in her gut either the stomach or in the colon.  I recommend referral to the gastroenterologist ASAP.  #2 start iron supplement twice a day.  Advised to pick up the medication today.  #3 hold clopidogrel for now.  Continue the aspirin.  I note that she had stent to the left common iliac artery back in June of this year by Dr. Donzetta Matters.  I will send Dr. Donzetta Matters message informing him of these recent developments and making sure it is okay for [place] to stop the Plavix with her being 4 months out from stent placement.

## 2019-12-02 ENCOUNTER — Telehealth: Payer: Self-pay | Admitting: Internal Medicine

## 2019-12-02 LAB — IRON,TIBC AND FERRITIN PANEL
Ferritin: 7 ng/mL — ABNORMAL LOW (ref 15–150)
Iron Saturation: 3 % — CL (ref 15–55)
Iron: 13 ug/dL — ABNORMAL LOW (ref 27–139)
Total Iron Binding Capacity: 413 ug/dL (ref 250–450)
UIBC: 400 ug/dL — ABNORMAL HIGH (ref 118–369)

## 2019-12-02 LAB — SPECIMEN STATUS REPORT

## 2019-12-02 MED ORDER — OMEPRAZOLE 40 MG PO CPDR: 40.0000 mg | DELAYED_RELEASE_CAPSULE | Freq: Every day | ORAL | 3 refills | Status: DC

## 2019-12-02 NOTE — Telephone Encounter (Signed)
On call placed to patient today.  I left a voicemail message on her cell phone informing her that I did touch base with Dr. Claretha Cooper office yesterday.  He was out of office but I was able to speak with one of his partners.  He states that since she has been on the plan for several months it is okay for the Plavix to be discontinued. I also recommended that besides taking the iron supplement, the patient start taking omeprazole to help protect her stomach in the event that she had a gastric ulcer.  I have sent that prescription to her pharmacy.

## 2019-12-03 ENCOUNTER — Ambulatory Visit: Payer: Medicaid Other | Attending: Internal Medicine | Admitting: Pharmacist

## 2019-12-03 ENCOUNTER — Other Ambulatory Visit: Payer: Self-pay

## 2019-12-03 ENCOUNTER — Encounter: Payer: Self-pay | Admitting: Pharmacist

## 2019-12-03 VITALS — BP 160/83 | HR 84

## 2019-12-03 DIAGNOSIS — I10 Essential (primary) hypertension: Secondary | ICD-10-CM | POA: Diagnosis not present

## 2019-12-03 NOTE — Progress Notes (Signed)
   S:    PCP: Dr. Wynetta Emery  Patient arrives in good spirits. Presents to the clinic for hypertension evaluation, counseling, and management. Patient was referred on 11/26/2019 by Amy.  Patient was last seen by Primary Care Provider on 11/02/2019.   Today, she denies medication side effects. She endorses continued palpitations (described as a "pounding heart rate") that are exacerbated by physical activity and laughing. Denies presyncope or syncope. No chest pressure or pain.   Medication adherence reported, however, she reveals she just started carvedilol two days ago. 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 throughout the day  Family / Social history:  - FHx:  - Tobacco: current 0.5 PPD smoker  - Alcohol: denies use   O:  Vitals:   12/03/19 1122  BP: (!) 160/83  Pulse: 84   Home BP readings: none   Last 3 Office BP readings: BP Readings from Last 3 Encounters:  12/03/19 (!) 160/83  11/26/19 (!) 153/105  08/14/19 (!) 162/109    BMET    Component Value Date/Time   NA 142 07/13/2019 0740   NA 142 05/11/2019 1534   K 3.8 07/13/2019 0740   CL 100 07/13/2019 0740   CO2 23 05/11/2019 1534   GLUCOSE 88 07/13/2019 0740   BUN 8 07/13/2019 0740   BUN 13 05/11/2019 1534   CREATININE 0.90 07/13/2019 0740   CALCIUM 9.2 05/11/2019 1534   GFRNONAA 68 05/11/2019 1534   GFRAA 79 05/11/2019 1534    Renal function: CrCl cannot be calculated (Patient's most recent lab result is older than the maximum 21 days allowed.).  Clinical ASCVD: No  The 10-year ASCVD risk score Mikey Bussing DC Jr., et al., 2013) is: 21.5%   Values used to calculate the score:     Age: 59 years     Sex: Female     Is Non-Hispanic African American: Yes     Diabetic: No     Tobacco smoker: Yes     Systolic Blood Pressure:  160 mmHg     Is BP treated: Yes     HDL Cholesterol: 64 mg/dL     Total Cholesterol: 184 mg/dL  A/P: Hypertension longstanding currently uncontrolled on current medications. BP Goal = < 130/80 mmHg. Medication adherence reported. Of note, she did just start her carvedilol. I recommend she continue her current medications and begin check BP at home. She plans to obtain a digital cuff. She has appropriate f/u planned with Cardiology. Will likely need continued BB titration or additional therapy.  -Continued current regimen.  -Counseled on lifestyle modifications for blood pressure control including reduced dietary sodium, increased exercise, adequate sleep.  Results reviewed and written information provided.   Total time in face-to-face counseling 15 minutes.   F/U Clinic Visit in ~2 weeks with Cardio.   Benard Halsted, PharmD, [place] [phone]

## 2019-12-10 ENCOUNTER — Encounter: Payer: Self-pay | Admitting: Gastroenterology

## 2019-12-10 DIAGNOSIS — I10 Essential (primary) hypertension: Secondary | ICD-10-CM | POA: Insufficient documentation

## 2019-12-11 ENCOUNTER — Telehealth: Payer: Self-pay | Admitting: Internal Medicine

## 2019-12-11 NOTE — Telephone Encounter (Signed)
-----   Message from Ena Dawley sent at 12/11/2019 12:26 PM EST ----- Kermit Balo Afternoon  Patient schedule 11/30 @ 11:30am  at [facility]   ----- Message ----- From: Ladell Pier, MD Sent: 12/01/2019   8:34 AM EST To: Ena Dawley  Please get her to [facility] ASAP.

## 2019-12-17 NOTE — Progress Notes (Signed)
Cardiology Office Note:    Date:  12/18/2019   ID:  Brooke Chapman, DOB 11/23/1960, MRN [id number]  PCP:  Ladell Pier, MD  Cardiologist:  Shirlee More, MD   Referring MD: Camillia Herter, NP  ASSESSMENT:    1. Palpitations   2. Essential hypertension   3. Pure hypercholesterolemia   4. PAD (peripheral artery disease) (HCC)    PLAN:    In order of problems listed above:  1. Symptoms are exertional in the differential diagnosis includes symptomatic anemia with palpitation versus arrhythmia and will apply 3-day ZIO monitor for quick evaluation I am going to give her an adequate dose of her beta-blocker doubling carvedilol and if her blood pressure remains above target will increase to 12 and half milligrams twice daily in 2 weeks if she purchase a cuff to measure blood pressure.  So echocardiogram to screen for cardiomyopathy hypertensive. 2. Poorly controlled if not at target Next visit will need a diuretic 3. Continue her statin likely require combined therapy either Zetia or PCSK9 inhibitor to achieve lowest target less than 55 LDL 4. Improved but still has exertional claudication 5. If palpitation is sinus tachycardia and the problem with symptomatic anemia will require parenteral iron administration  Next appointment 3 weeks in this office to follow-up for the testing and blood pressure.   Medication Adjustments/Labs and Tests Ordered: Current medicines are reviewed at length with the patient today.  Concerns regarding medicines are outlined above.  Orders Placed This Encounter  Procedures  . LONG TERM MONITOR (3-14 DAYS)  . ECHOCARDIOGRAM COMPLETE   Meds ordered this encounter  Medications  . carvedilol (COREG) 12.5 MG tablet    Sig: Take 1 tablet (12.5 mg total) by mouth 2 (two) times daily.    Dispense:  180 tablet    Refill:  3     No chief complaint on file.   History of Present Illness:    Brooke Chapman is a 59 y.o. female who is being seen today for  the evaluation of palpitation at the request of Camillia Herter, NP.  She has been seen by Pharm.D. for hypertension control.  Reviewing her chart when seen by her PCP 11/26/2019 she had palpitation the EKG showed sinus rhythm and this appears to be the reason for referral.  Chart review shows that she has iron deficiency with a ferritin of 7 and saturation of 3%. 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  The right common femoral artery was heavily diseased with stenosis greater than 50% of the profunda.  The SFA is occluded and reconstitutes below the knee at the popliteal artery.  She was seen in the emergency room initially March 2020 with claudication restarted on amlodipine aspirin and referred to vascular surgery.  She is employed [employer] is a very vigorous demanding job and she finds that at work she develops profound fatigue weakness sometimes lightheadedness and her heart racing.  Sometimes occurs at home with activities but not at rest not at nighttime and she is relieved with rest.  She has no known history of heart disease or arrhythmia.  General she is not feeling well she is the word fatigue she has lost 15 pounds in the last year.  On iron  supplements she has constipation and tarry stools but tells me she had the same problem before she started iron.  She tells me she had a normal colonoscopy about 3 years ago.  She purchase a blood pressure device but it was not digital cannot check her blood pressure and I asked her to purchase a digital blood pressure device to measure at home.  No shortness of breath orthopnea edema or chest pain she has not had syncope this is gone on since the time she developed anemia in the last month and occurs  daily.  Often she cannot get through the day at work.  She continues to have left calf claudication but is improved since her PCI Past Medical History:  Diagnosis Date  . HTN (hypertension) 05/01/2018  . Hyperlipidemia   . Hypertension   . PAD (peripheral artery disease) ([place])     Past Surgical History:  Procedure Laterality Date  . ABDOMINAL AORTOGRAM W/LOWER EXTREMITY Bilateral 07/13/2019   Procedure: ABDOMINAL AORTOGRAM W/LOWER EXTREMITY;  Surgeon: Waynetta Sandy, MD;  Location: [facility];  Service: Cardiovascular;  Laterality: Bilateral;  . BREAST EXCISIONAL BIOPSY Left    LESS THAN 5 YEARS AGO  . PERIPHERAL VASCULAR INTERVENTION Left 07/13/2019   Procedure: PERIPHERAL VASCULAR INTERVENTION;  Surgeon: Waynetta Sandy, MD;  Location: [facility];  Service: Cardiovascular;  Laterality: Left;  common iliac    Current Medications: Current Meds  Medication Sig  . amLODipine (NORVASC) 10 MG tablet Take 1 tablet (10 mg total) by mouth daily. Must have office visit for refills  . aspirin 81 MG chewable tablet Chew 81 mg by mouth daily.  Marland Kitchen atorvastatin (LIPITOR) 20 MG tablet Take 1.5 tablets (30 mg total) by mouth daily.  . Blood Pressure Monitor DEVI Use as directed to check home blood pressure 2-3 times a week  . cholecalciferol (VITAMIN D3) 25 MCG (1000 UT) tablet Take 1,000 Units by mouth daily.  . cilostazol (PLETAL) 100 MG tablet Take 1 tablet by mouth twice daily  . ferrous sulfate 325 (65 FE) MG tablet Take 1 tablet (325 mg total) by mouth 2 (two) times daily with a meal.  . omeprazole (PRILOSEC) 40 MG capsule Take 1 capsule (40 mg total) by mouth daily.  . [DISCONTINUED] carvedilol (COREG) 3.125 MG tablet Take 1 tablet (3.125 mg total) by mouth 2 (two) times daily with a meal.     Allergies:   Latex   Social History   Socioeconomic History  . Marital status: Single    Spouse name: Not on file  . Number of children: Not on file  . Years of  education: Not on file  . Highest education level: Not on file  Occupational History  . Not on file  Tobacco Use  . Smoking status: Current Every Day Smoker    Packs/day: 0.50    Types: Cigarettes  . Smokeless tobacco: Never Used  . Tobacco comment: 6-10 cigs daily  Vaping Use  . Vaping Use: Never used  Substance and Sexual Activity  . Alcohol use: Yes    Comment: rare  . Drug use: Never  . Sexual activity: Not Currently  Other Topics Concern  . Not on file  Social History Narrative  . Not on file   Social Determinants of Health   Financial Resource Strain:   . Difficulty of Paying Living Expenses: Not on file  Food Insecurity:   . Worried About Charity fundraiser in the Last Year: Not on  file  . [place] in the Last Year: Not on file  Transportation Needs:   . Lack of Transportation (Medical): Not on file  . Lack of Transportation (Non-Medical): Not on file  Physical Activity:   . Days of Exercise per Week: Not on file  . Minutes of Exercise per Session: Not on file  Stress:   . Feeling of Stress : Not on file  Social Connections:   . Frequency of Communication with Friends and Family: Not on file  . Frequency of Social Gatherings with Friends and Family: Not on file  . Attends Religious Services: Not on file  . Active Member of Clubs or Organizations: Not on file  . Attends Archivist Meetings: Not on file  . Marital Status: Not on file     Family History: The patient's family history includes Heart disease in her mother.  ROS:   ROS Please see the history of present illness.     All other systems reviewed and are negative.  EKGs/Labs/Other Studies Reviewed:    The following studies were reviewed today:   EKG:  EKG 11/26/2019 independently reviewed sinus rhythm normal  Recent Labs: 05/11/2019: ALT 9 07/13/2019: BUN 8; Creatinine, Ser 0.90; Potassium 3.8; Sodium 142 11/26/2019: Hemoglobin 9.0; Platelets 403; TSH 0.755  Recent Lipid  Panel    Component Value Date/Time   CHOL 184 05/11/2019 1534   TRIG 142 05/11/2019 1534   HDL 64 05/11/2019 1534   CHOLHDL 2.9 05/11/2019 1534   LDLCALC 95 05/11/2019 1534    Physical Exam:    VS:  BP (!) 176/104   Pulse 74   Ht 5\' 4"  (1.626 m)   Wt 144 lb (65.3 kg)   SpO2 97%   BMI 24.72 kg/m     Wt Readings from Last 3 Encounters:  12/18/19 144 lb (65.3 kg)  11/26/19 147 lb 6.4 oz (66.9 kg)  08/14/19 156 lb (70.8 kg)     GEN: Thin she looks older than her age well nourished, well developed in no acute distress HEENT: Normal NECK: No JVD; No carotid bruits LYMPHATICS: No lymphadenopathy CARDIAC: RRR, no murmurs, rubs, gallops RESPIRATORY:  Clear to auscultation without rales, wheezing or rhonchi  ABDOMEN: Soft, non-tender, non-distended MUSCULOSKELETAL:  No edema; No deformity  SKIN: Warm and dry NEUROLOGIC:  Alert and oriented x 3 PSYCHIATRIC:  Normal affect     Signed, Shirlee More, MD  12/18/2019 9:13 AM    [place]

## 2019-12-18 ENCOUNTER — Other Ambulatory Visit: Payer: Self-pay

## 2019-12-18 ENCOUNTER — Ambulatory Visit (INDEPENDENT_AMBULATORY_CARE_PROVIDER_SITE_OTHER): Payer: Medicaid Other

## 2019-12-18 ENCOUNTER — Ambulatory Visit (INDEPENDENT_AMBULATORY_CARE_PROVIDER_SITE_OTHER): Payer: Medicaid Other | Admitting: Cardiology

## 2019-12-18 ENCOUNTER — Encounter: Payer: Self-pay | Admitting: Cardiology

## 2019-12-18 VITALS — BP 176/104 | HR 74 | Ht 64.0 in | Wt 144.0 lb

## 2019-12-18 DIAGNOSIS — I739 Peripheral vascular disease, unspecified: Secondary | ICD-10-CM

## 2019-12-18 DIAGNOSIS — R002 Palpitations: Secondary | ICD-10-CM

## 2019-12-18 DIAGNOSIS — E7800 Pure hypercholesterolemia, unspecified: Secondary | ICD-10-CM

## 2019-12-18 DIAGNOSIS — I10 Essential (primary) hypertension: Secondary | ICD-10-CM

## 2019-12-18 MED ORDER — CARVEDILOL 12.5 MG PO TABS: 12.5000 mg | ORAL_TABLET | Freq: Two times a day (BID) | ORAL | 3 refills | Status: DC

## 2019-12-18 NOTE — Patient Instructions (Addendum)
Medication Instructions:  Your physician has recommended you make the following change in your medication:  INCREASE: Carvedilol 12.5 mg take 1/2 tablet by mouth twice daily for the next two weeks. If your blood pressure remains above 140/90 after two weeks please take one tablet by mouth twice daily.  *If you need a refill on your cardiac medications before your next appointment, please call your pharmacy*   Lab Work: None If you have labs (blood work) drawn today and your tests are completely normal, you will receive your results only by: Marland Kitchen MyChart Message (if you have MyChart) OR . A paper copy in the mail If you have any lab test that is abnormal or we need to change your treatment, we will call you to review the results.   Testing/Procedures: Your physician has requested that you have an echocardiogram. Echocardiography is a painless test that uses sound waves to create images of your heart. It provides your doctor with information about the size and shape of your heart and how well your heart's chambers and valves are working. This procedure takes approximately one hour. There are no restrictions for this procedure.  A zio monitor was ordered today. It will remain on for 3 days. You will then return monitor and event diary in provided box. It takes 1-2 weeks for report to be downloaded and returned to [place]. We will call you with the results. If monitor falls off or has orange flashing light, please call Zio for further instructions.      Follow-Up: At [facility], you and your health needs are our priority.  As part of our continuing mission to provide you with exceptional heart care, we have created designated Provider Care Teams.  These Care Teams include your primary Cardiologist (physician) and Advanced Practice Providers (APPs -  Physician Assistants and Nurse Practitioners) who all work together to provide you with the care you need, when you need it.  We recommend signing up  for the patient portal called "MyChart".  Sign up information is provided on this After Visit Summary.  MyChart is used to connect with patients for Virtual Visits (Telemedicine).  Patients are able to view lab/test results, encounter notes, upcoming appointments, etc.  Non-urgent messages can be sent to your provider as well.   To learn more about what you can do with MyChart, go to [email or web address].    Your next appointment:   3 week(s)  The format for your next appointment:   In Person  Provider:   Shirlee More, MD or Berniece Salines, MD   Other Instructions  Tips to measure your blood pressure correctly  To determine whether you have hypertension, a medical professional will take a blood pressure reading. How you prepare for the test, the position of your arm, and other factors can change a blood pressure reading by 10% or more. That could be enough to hide high blood pressure, start you on a drug you don't really need, or lead your doctor to incorrectly adjust your medications. National and international guidelines offer specific instructions for measuring blood pressure. If a doctor, nurse, or medical assistant isn't doing it right, don't hesitate to ask him or her to get with the guidelines. Here's what you can do to ensure a correct reading: . Don't drink a caffeinated beverage or smoke during the 30 minutes before the test. . Sit quietly for five minutes before the test begins. . During the measurement, sit in a chair with your feet on the floor  and your arm supported so your elbow is at about heart level. . The inflatable part of the cuff should completely cover at least 80% of your upper arm, and the cuff should be placed on bare skin, not over a shirt. . Don't talk during the measurement. . Have your blood pressure measured twice, with a brief break in between. If the readings are different by 5 points or more, have it done a third time. There are times to break these rules.  If you sometimes feel lightheaded when getting out of bed in the morning or when you stand after sitting, you should have your blood pressure checked while seated and then while standing to see if it falls from one position to the next. Because blood pressure varies throughout the day, your doctor will rarely diagnose hypertension on the basis of a single reading. Instead, he or she will want to confirm the measurements on at least two occasions, usually within a few weeks of one another. The exception to this rule is if you have a blood pressure reading of 180/110 mm Hg or higher. A result this high usually calls for prompt treatment. It's also a good idea to have your blood pressure measured in both arms at least once, since the reading in one arm (usually the right) may be higher than that in the left. A 2014 study in The American Journal of Medicine of nearly 3,400 people found average arm- to-arm differences in systolic blood pressure of about 5 points. The higher number should be used to make treatment decisions. In 2017, new guidelines from the [place], the [employer] of [employer], and nine other health organizations lowered the diagnosis of high blood pressure to 130/80 mm Hg or higher for all adults. The guidelines also redefined the various blood pressure categories to now include normal, elevated, Stage 1 hypertension, Stage 2 hypertension, and hypertensive crisis (see "Blood pressure categories"). Blood pressure categories  Blood pressure category SYSTOLIC (upper number)  DIASTOLIC (lower number)  Normal Less than 120 mm Hg and Less than 80 mm Hg  Elevated 120-129 mm Hg and Less than 80 mm Hg  High blood pressure: Stage 1 hypertension 130-139 mm Hg or 80-89 mm Hg  High blood pressure: Stage 2 hypertension 140 mm Hg or higher or 90 mm Hg or higher  Hypertensive crisis (consult your doctor immediately) Higher than 180 mm Hg and/or Higher than 120 mm Hg  Source:  American Heart Association and American Stroke Association. For more on getting your blood pressure under control, buy Controlling Your Blood Pressure, a Special Health Report from [facility].DASH diet: Healthy eating to lower your blood pressure The DASH diet emphasizes portion size, eating a variety of foods and getting the right amount of nutrients. Discover how DASH can improve your health and lower your blood pressure. By [facility] Staff  DASH stands for Dietary Approaches to Stop Hypertension. The DASH diet is a lifelong approach to healthy eating that's designed to help treat or prevent high blood pressure (hypertension). The DASH diet encourages you to reduce the sodium in your diet and eat a variety of foods rich in nutrients that help lower blood pressure, such as potassium, calcium and magnesium. By following the DASH diet, you may be able to reduce your blood pressure by a few points in just two weeks. Over time, your systolic blood pressure could drop by eight to 14 points, which can make a significant difference in your health risks.  Because the DASH diet is a healthy way of eating, it offers health benefits besides just lowering blood pressure. The DASH diet is also in line with dietary recommendations to prevent osteoporosis, cancer, heart disease, stroke and diabetes. DASH diet: Sodium levels The DASH diet emphasizes vegetables, fruits and low-fat dairy foods -- and moderate amounts of whole grains, fish, poultry and nuts. In addition to the standard DASH diet, there is also a lower sodium version of the diet. You can choose the version of the diet that meets your health needs: Standard DASH diet. You can consume up to 2,300 milligrams (mg) of sodium a day.  Lower sodium DASH diet. You can consume up to 1,500 mg of sodium a day. Both versions of the DASH diet aim to reduce the amount of sodium in your diet compared with what you might get in a typical American diet, which  can amount to a whopping 3,400 mg of sodium a day or more. The standard DASH diet meets the recommendation from the Dietary Guidelines for Americans to keep daily sodium intake to less than 2,300 mg a day. The American Heart Association recommends 1,500 mg a day of sodium as an upper limit for all adults. If you aren't sure what sodium level is right for you, talk to your doctor. DASH diet: What to eat Both versions of the DASH diet include lots of whole grains, fruits, vegetables and low-fat dairy products. The DASH diet also includes some fish, poultry and legumes, and encourages a small amount of nuts and seeds a few times a week.  You can eat red meat, sweets and fats in small amounts. The DASH diet is low in saturated fat, cholesterol and total fat. Here's a look at the recommended servings from each food group for the 2,000-calorie-a-day DASH diet. Grains: 6 to 8 servings a day Grains include bread, cereal, rice and pasta. Examples of one serving of grains include 1 slice whole-wheat bread, 1 ounce dry cereal, or 1/2 cup cooked cereal, rice or pasta. Focus on whole grains because they have more fiber and nutrients than do refined grains. For instance, use brown rice instead of white rice, whole-wheat pasta instead of regular pasta and whole-grain bread instead of white bread. Look for products labeled "100 percent whole grain" or "100 percent whole wheat."  Grains are naturally low in fat. Keep them this way by avoiding butter, cream and cheese sauces. Vegetables: 4 to 5 servings a day Tomatoes, carrots, broccoli, sweet potatoes, greens and other vegetables are full of fiber, vitamins, and such minerals as potassium and magnesium. Examples of one serving include 1 cup raw leafy green vegetables or 1/2 cup cut-up raw or cooked vegetables. Don't think of vegetables only as side dishes -- a hearty blend of vegetables served over brown rice or whole-wheat noodles can serve as the main dish for a meal.   Fresh and frozen vegetables are both good choices. When buying frozen and canned vegetables, choose those labeled as low sodium or without added salt.  To increase the number of servings you fit in daily, be creative. In a stir-fry, for instance, cut the amount of meat in half and double up on the vegetables. Fruits: 4 to 5 servings a day Many fruits need little preparation to become a healthy part of a meal or snack. Like vegetables, they're packed with fiber, potassium and magnesium and are typically low in fat -- coconuts are an exception. Examples of one serving include one medium fruit, 1/2  cup fresh, frozen or canned fruit, or 4 ounces of juice. Have a piece of fruit with meals and one as a snack, then round out your day with a dessert of fresh fruits topped with a dollop of low-fat yogurt.  Leave on edible peels whenever possible. The peels of apples, pears and most fruits with pits add interesting texture to recipes and contain healthy nutrients and fiber.  Remember that citrus fruits and juices, such as grapefruit, can interact with certain medications, so check with your doctor or pharmacist to see if they're OK for you.  If you choose canned fruit or juice, make sure no sugar is added. Dairy: 2 to 3 servings a day Milk, yogurt, cheese and other dairy products are major sources of calcium, vitamin D and protein. But the key is to make sure that you choose dairy products that are low fat or fat-free because otherwise they can be a major source of fat -- and most of it is saturated. Examples of one serving include 1 cup skim or 1 percent milk, 1 cup low fat yogurt, or 1 1/2 ounces part-skim cheese. Low-fat or fat-free frozen yogurt can help you boost the amount of dairy products you eat while offering a sweet treat. Add fruit for a healthy twist.  If you have trouble digesting dairy products, choose lactose-free products or consider taking an over-the-counter product that contains the enzyme  lactase, which can reduce or prevent the symptoms of lactose intolerance.  Go easy on regular and even fat-free cheeses because they are typically high in sodium. Lean meat, poultry and fish: 6 servings or fewer a day Meat can be a rich source of protein, B vitamins, iron and zinc. Choose lean varieties and aim for no more than 6 ounces a day. Cutting back on your meat portion will allow room for more vegetables. Trim away skin and fat from poultry and meat and then bake, broil, grill or roast instead of frying in fat.  Eat heart-healthy fish, such as salmon, herring and tuna. These types of fish are high in omega-3 fatty acids, which can help lower your total cholesterol. Nuts, seeds and legumes: 4 to 5 servings a week Almonds, sunflower seeds, kidney beans, peas, lentils and other foods in this family are good sources of magnesium, potassium and protein. They're also full of fiber and phytochemicals, which are plant compounds that may protect against some cancers and cardiovascular disease. Serving sizes are small and are intended to be consumed only a few times a week because these foods are high in calories. Examples of one serving include 1/3 cup nuts, 2 tablespoons seeds, or 1/2 cup cooked beans or peas.  Nuts sometimes get a bad rap because of their fat content, but they contain healthy types of fat -- monounsaturated fat and omega-3 fatty acids. They're high in calories, however, so eat them in moderation. Try adding them to stir-fries, salads or cereals.  Soybean-based products, such as tofu and tempeh, can be a good alternative to meat because they contain all of the amino acids your body needs to make a complete protein, just like meat. Fats and oils: 2 to 3 servings a day Fat helps your body absorb essential vitamins and helps your body's immune system. But too much fat increases your risk of heart disease, diabetes and obesity. The DASH diet strives for a healthy balance by limiting total  fat to less than 30 percent of daily calories from fat, with a focus on the healthier  monounsaturated fats. Examples of one serving include 1 teaspoon soft margarine, 1 tablespoon mayonnaise or 2 tablespoons salad dressing. Saturated fat and trans fat are the main dietary culprits in increasing your risk of coronary artery disease. DASH helps keep your daily saturated fat to less than 6 percent of your total calories by limiting use of meat, butter, cheese, whole milk, cream and eggs in your diet, along with foods made from lard, solid shortenings, and palm and coconut oils.  Avoid trans fat, commonly found in such processed foods as crackers, baked goods and fried items.  Read food labels on margarine and salad dressing so that you can choose those that are lowest in saturated fat and free of trans fat. Sweets: 5 servings or fewer a week You don't have to banish sweets entirely while following the DASH diet -- just go easy on them. Examples of one serving include 1 tablespoon sugar, jelly or jam, 1/2 cup sorbet, or 1 cup lemonade. When you eat sweets, choose those that are fat-free or low-fat, such as sorbets, fruit ices, jelly beans, hard candy, graham crackers or low-fat cookies.  Artificial sweeteners such as aspartame (NutraSweet, Equal) and sucralose (Splenda) may help satisfy your sweet tooth while sparing the sugar. But remember that you still must use them sensibly. It's OK to swap a diet cola for a regular cola, but not in place of a more nutritious beverage such as low-fat milk or even plain water.  Cut back on added sugar, which has no nutritional value but can pack on calories. DASH diet: Alcohol and caffeine Drinking too much alcohol can increase blood pressure. The Dietary Guidelines for Americans recommends that men limit alcohol to no more than two drinks a day and women to one or less. The DASH diet doesn't address caffeine consumption. The influence of caffeine on blood pressure  remains unclear. But caffeine can cause your blood pressure to rise at least temporarily. If you already have high blood pressure or if you think caffeine is affecting your blood pressure, talk to your doctor about your caffeine consumption. DASH diet and weight loss While the DASH diet is not a weight-loss program, you may indeed lose unwanted pounds because it can help guide you toward healthier food choices. The DASH diet generally includes about 2,000 calories a day. If you're trying to lose weight, you may need to eat fewer calories. You may also need to adjust your serving goals based on your individual circumstances -- something your health care team can help you decide. Tips to cut back on sodium The foods at the core of the DASH diet are naturally low in sodium. So just by following the DASH diet, you're likely to reduce your sodium intake. You also reduce sodium further by: Using sodium-free spices or flavorings with your food instead of salt  Not adding salt when cooking rice, pasta or hot cereal  Rinsing canned foods to remove some of the sodium  Buying foods labeled "no salt added," "sodium-free," "low sodium" or "very low sodium" One teaspoon of table salt has 2,325 mg of sodium. When you read food labels, you may be surprised at just how much sodium some processed foods contain. Even low-fat soups, canned vegetables, ready-to-eat cereals and sliced [place] from the local deli -- foods you may have considered healthy -- often have lots of sodium. You may notice a difference in taste when you choose low-sodium food and beverages. If things seem too bland, gradually introduce low-sodium foods and cut  back on table salt until you reach your sodium goal. That'll give your palate time to adjust. Using salt-free seasoning blends or herbs and spices may also ease the transition. It can take several weeks for your taste buds to get used to less salty foods. Putting the pieces of the DASH diet  together Try these strategies to get started on the DASH diet:  Change gradually. If you now eat only one or two servings of fruits or vegetables a day, try to add a serving at lunch and one at dinner. Rather than switching to all whole grains, start by making one or two of your grain servings whole grains. Increasing fruits, vegetables and whole grains gradually can also help prevent bloating or diarrhea that may occur if you aren't used to eating a diet with lots of fiber. You can also try over-the-counter products to help reduce gas from beans and vegetables.  Reward successes and forgive slip-ups. Reward yourself with a nonfood treat for your accomplishments -- rent a movie, purchase a book or get together with a friend. Everyone slips, especially when learning something new. Remember that changing your lifestyle is a long-term process. Find out what triggered your setback and then just pick up where you left off with the DASH diet.  Add physical activity. To boost your blood pressure lowering efforts even more, consider increasing your physical activity in addition to following the DASH diet. Combining both the DASH diet and physical activity makes it more likely that you'll reduce your blood pressure.  Get support if you need it. If you're having trouble sticking to your diet, talk to your doctor or dietitian about it. You might get some tips that will help you stick to the DASH diet. Remember, healthy eating isn't an all-or-nothing proposition. What's most important is that, on average, you eat healthier foods with plenty of variety -- both to keep your diet nutritious and to avoid boredom or extremes. And with the DASH diet, you can have both.

## 2019-12-28 ENCOUNTER — Ambulatory Visit: Payer: Medicaid Other | Attending: Internal Medicine | Admitting: Internal Medicine

## 2019-12-28 ENCOUNTER — Other Ambulatory Visit: Payer: Self-pay

## 2019-12-28 ENCOUNTER — Encounter: Payer: Self-pay | Admitting: Internal Medicine

## 2019-12-28 VITALS — BP 180/100 | HR 89 | Resp 16 | Wt 144.0 lb

## 2019-12-28 DIAGNOSIS — L659 Nonscarring hair loss, unspecified: Secondary | ICD-10-CM

## 2019-12-28 DIAGNOSIS — I10 Essential (primary) hypertension: Secondary | ICD-10-CM

## 2019-12-28 DIAGNOSIS — D509 Iron deficiency anemia, unspecified: Secondary | ICD-10-CM

## 2019-12-28 DIAGNOSIS — F17200 Nicotine dependence, unspecified, uncomplicated: Secondary | ICD-10-CM

## 2019-12-28 HISTORY — DX: Nonscarring hair loss, unspecified: L65.9

## 2019-12-28 HISTORY — DX: Iron deficiency anemia, unspecified: D50.9

## 2019-12-28 MED ORDER — HYDROCHLOROTHIAZIDE 12.5 MG PO TABS: 12.5000 mg | ORAL_TABLET | Freq: Every day | ORAL | 3 refills | Status: DC

## 2019-12-28 NOTE — Progress Notes (Signed)
Patient ID: Brooke Chapman, female    DOB: 02-22-1960  MRN: [id number]  CC: Follow-up (4 week )   Subjective: Brooke Chapman is a 59 y.o. female who presents for 4 wks f/u Her concerns today include:  Patient with history of PAD left leg, HTN, tobacco dependence, HL, urge incont.   On last visit, patient complained of palpitations and dizziness.  She was found to have a new anemia with hemoglobin of 9 with previous level 5 months prior of 14.6.  She was referred to the gastroenterologist and has an appointment with them tomorrow.  She was advised to discontinue Plavix.  Advised to take iron supplement twice a day which she states she has been taking.   No blood in the stools but states that she has tarry stools.  Denies any abdominal pain.   Still gets palpatations with dizziness.  Has to stop and take deep breaths when episodes occur.  She saw cardiologist Dr. Bettina Gavia who ordered a 72-hour monitor.  She turned it in last week but has not received results as yet.  HTN: We added carvedilol on last visit.  Saw Dr.  Bettina Gavia and blood pressure was elevated.  Coreg increased to 12.5 mg.  Just started taking the higher dose yesterday.  Reports compliance with amlodipine.  I note today when she took a mass off that she has a scar LT cheek.  Patient states this has been present for 10 yrs.  Reports there was a lesion there for which she had seen a dermatologist.  The dermatologist scrape the skin and it left the scarring.  Plan was for skin graft but her insurance did not approve it.  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  She used to wear locks but she cut her locks 10 years ago.   Ready to quick smoking: States that she plans to use a product that she is used in the past called Smoke Away that she purchased off the Internet.  Feels that this is the only thing that will help her quit smoking.  She is not sure what the ingredients are but states that it decreases her  cravings for cigarettes. Patient Active Problem List   Diagnosis Date Noted  . Essential hypertension   . 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 PVD (peripheral vascular disease) ([place]) 08/26/2018  . Hyperlipidemia 05/02/2018  . HTN (hypertension) 05/01/2018  . Tobacco dependence 05/01/2018  . PAD (peripheral artery disease) ([place])      Current Outpatient Medications on File Prior to Visit  Medication Sig Dispense Refill  . amLODipine (NORVASC) 10 MG tablet Take 1 tablet (10 mg total) by mouth daily. Must have office visit for refills 30 tablet 6  . aspirin 81 MG chewable tablet Chew 81 mg by mouth daily.    Marland Kitchen atorvastatin (LIPITOR) 20 MG tablet Take 1.5 tablets (30 mg total) by mouth daily. 135 tablet 3  . Blood Pressure Monitor DEVI Use as directed to check home blood pressure 2-3 times a week 1 Device 0  . buPROPion (WELLBUTRIN SR) 150 MG 12 hr tablet Take 1 tablet (150 mg total) by mouth 2 (two) times daily. (Patient not taking: Reported on 12/18/2019) 60 tablet 1  . carvedilol (COREG) 12.5 MG tablet Take 1 tablet (12.5 mg total) by mouth 2 (two) times daily. 180 tablet 3  . cholecalciferol (VITAMIN D3) 25 MCG (1000 UT) tablet Take 1,000 Units by mouth daily.    . cilostazol (  PLETAL) 100 MG tablet Take 1 tablet by mouth twice daily 180 tablet 0  . ferrous sulfate 325 (65 FE) MG tablet Take 1 tablet (325 mg total) by mouth 2 (two) times daily with a meal. 100 tablet 1  . omeprazole (PRILOSEC) 40 MG capsule Take 1 capsule (40 mg total) by mouth daily. 30 capsule 3   No current facility-administered medications on file prior to visit.    Allergies  Allergen Reactions  . Latex Rash    Social History   Socioeconomic History  . Marital status: Single    Spouse name: Not on file  . Number of children: Not on file  . Years of education: Not on file  . Highest education level: Not on file  Occupational History  . Not on file  Tobacco Use  . Smoking status: Current Every Day Smoker     Packs/day: 0.50    Types: Cigarettes  . Smokeless tobacco: Never Used  . Tobacco comment: 6-10 cigs daily  Vaping Use  . Vaping Use: Never used  Substance and Sexual Activity  . Alcohol use: Yes    Comment: rare  . Drug use: Never  . Sexual activity: Not Currently  Other Topics Concern  . Not on file  Social History Narrative  . Not on file   Social Determinants of Health   Financial Resource Strain:   . Difficulty of Paying Living Expenses: Not on file  Food Insecurity:   . Worried About Charity fundraiser in the Last Year: Not on file  . Ran Out of Food in the Last Year: Not on file  Transportation Needs:   . Lack of Transportation (Medical): Not on file  . Lack of Transportation (Non-Medical): Not on file  Physical Activity:   . Days of Exercise per Week: Not on file  . Minutes of Exercise per Session: Not on file  Stress:   . Feeling of Stress : Not on file  Social Connections:   . Frequency of Communication with Friends and Family: Not on file  . Frequency of Social Gatherings with Friends and Family: Not on file  . Attends Religious Services: Not on file  . Active Member of Clubs or Organizations: Not on file  . Attends Archivist Meetings: Not on file  . Marital Status: Not on file  Intimate Partner Violence:   . Fear of Current or Ex-Partner: Not on file  . Emotionally Abused: Not on file  . Physically Abused: Not on file  . Sexually Abused: Not on file    Family History  Problem Relation Age of Onset  . Heart disease Mother     Past Surgical History:  Procedure Laterality Date  . ABDOMINAL AORTOGRAM W/LOWER EXTREMITY Bilateral 07/13/2019   Procedure: ABDOMINAL AORTOGRAM W/LOWER EXTREMITY;  Surgeon: Waynetta Sandy, MD;  Location: [facility];  Service: Cardiovascular;  Laterality: Bilateral;  . BREAST EXCISIONAL BIOPSY Left    LESS THAN 5 YEARS AGO  . PERIPHERAL VASCULAR INTERVENTION Left 07/13/2019   Procedure: PERIPHERAL  VASCULAR INTERVENTION;  Surgeon: Waynetta Sandy, MD;  Location: [facility];  Service: Cardiovascular;  Laterality: Left;  common iliac    ROS: Review of Systems Negative except as stated above  PHYSICAL EXAM: BP (!) 180/100   Pulse 89   Resp 16   Wt 144 lb (65.3 kg)   SpO2 97%   BMI 24.72 kg/m   Wt Readings from Last 3 Encounters:  12/28/19 144 lb (  65.3 kg)  12/18/19 144 lb (65.3 kg)  11/26/19 147 lb 6.4 oz (66.9 kg)    Physical Exam  General appearance - alert, well appearing, and in no distress Mental status - normal mood, behavior, speech, dress, motor activity, and thought processes Eyes: Pink conjunctiva Chest -breath sounds slightly decreased but without wheezes or crackles. Heart - normal rate, regular rhythm, normal S1, S2, no murmurs, rubs, clicks or gallops Extremities -no lower extremity edema  skin -hypopigmented scarring area on the left cheek.  Large area of baldness with some hypopigmented scarring on the crown of the head.  See picture below     CMP Latest Ref Rng & Units 07/13/2019 05/11/2019 05/01/2018  Glucose 70 - 99 mg/dL 88 90 84  BUN 8 - 23 mg/dL 8 13 13   Creatinine 0.44 - 1.00 mg/dL 0.90 0.91 0.81  Sodium 135 - 145 mmol/L 142 142 143  Potassium 3.5 - 5.1 mmol/L 3.8 4.1 4.1  Chloride 98 - 111 mmol/L 100 101 101  CO2 20 - 29 mmol/L - 23 25  Calcium 8.7 - 10.3 mg/dL - 9.2 9.6  Total Protein 6.0 - 8.5 g/dL - 7.5 7.4  Total Bilirubin 0.0 - 1.2 mg/dL - <0.2 0.2  Alkaline Phos 39 - 117 IU/L - 111 71  AST 0 - 40 IU/L - 21 22  ALT 0 - 32 IU/L - 9 10   Lipid Panel     Component Value Date/Time   CHOL 184 05/11/2019 1534   TRIG 142 05/11/2019 1534   HDL 64 05/11/2019 1534   CHOLHDL 2.9 05/11/2019 1534   LDLCALC 95 05/11/2019 1534    CBC    Component Value Date/Time   WBC 7.1 11/26/2019 1226   WBC 6.0 04/10/2018 1203   RBC 3.84 11/26/2019 1226   RBC 4.47 04/10/2018 1203   HGB 9.0 (L) 11/26/2019 1226   HCT 29.9 (L)  11/26/2019 1226   PLT 403 11/26/2019 1226   MCV 78 (L) 11/26/2019 1226   MCH 23.4 (L) 11/26/2019 1226   MCH 31.1 04/10/2018 1203   MCHC 30.1 (L) 11/26/2019 1226   MCHC 31.8 04/10/2018 1203   RDW 17.2 (H) 11/26/2019 1226   LYMPHSABS 3.6 (H) 05/01/2018 1556   EOSABS 0.0 05/01/2018 1556   BASOSABS 0.0 05/01/2018 1556   Lab Results  Component Value Date   IRON 13 (L) 11/26/2019   TIBC 413 11/26/2019   FERRITIN 7 (L) 11/26/2019    ASSESSMENT AND PLAN:  1. Essential hypertension Not at goal.  Continue amlodipine and carvedilol.  Add hydrochlorothiazide.  Encourage to check blood pressure at least twice a week.  Follow-up with our clinical pharmacist in 1 week for repeat blood pressure check.  On that visit she needs to stop by the lab to have blood test done that have been ordered today as future labs. She will also keep her follow-up appointment with the cardiologist next month to receive the results of the Holter and echo that is planned for next month - Basic metabolic panel; Future - hydrochlorothiazide (HYDRODIURIL) 12.5 MG tablet; Take 1 tablet (12.5 mg total) by mouth daily.  Dispense: 90 tablet; Refill: 3  2. Iron deficiency anemia, unspecified iron deficiency anemia type Continue iron supplement.  Recheck CBC to see whether it has improved.  Stay off Plavix. Marland Kitchen  Keep appointment with the gastroenterologist tomorrow.  - CBC; Future  3. 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  Will refer to dermatology - Ambulatory referral to Dermatology -  ANA w/Reflex; Future  4. Tobacco dependence Advised to quit.  Commended her on wanting to quit.  Discussed methods to help her quit.   States that she plans to use some product that she has purchased online called smoke away which she states has worked for her in the past s.  I would like to know the ingredient.  Encourage her to set a quit date.  Less than 5 minutes spent on counseling.   Patient was given the opportunity to ask  questions.  Patient verbalized understanding of the plan and was able to repeat key elements of the plan.   Orders Placed This Encounter  Procedures  . CBC  . Basic metabolic panel  . ANA w/Reflex  . Ambulatory referral to Dermatology     Requested Prescriptions   Signed Prescriptions Disp Refills  . hydrochlorothiazide (HYDRODIURIL) 12.5 MG tablet 90 tablet 3    Sig: Take 1 tablet (12.5 mg total) by mouth daily.    Return in about 6 years (around 12/27/2025) for Give appt with [facility] in 1 week for repeat BP check and lab.  Karle Plumber, MD, FACP

## 2019-12-28 NOTE — Patient Instructions (Addendum)
Your blood pressure is not at goal.  Goal is 130/80 or lower.  Continue carvedilol and amlodipine.  We have added another blood pressure medication called hydrochlorothiazide 12.5 mg daily.  After you have been on this medication for 1 week, please return to the lab to have blood test done as discussed today.  Please keep your appointment with the gastroenterologist tomorrow.   Steps to Quit Smoking Smoking tobacco is the leading cause of preventable death. It can affect almost every organ in the body. Smoking puts you and people around you at risk for many serious, long-lasting (chronic) diseases. Quitting smoking can be hard, but it is one of the best things that you can do for your health. It is never too late to quit. How do I get ready to quit? When you decide to quit smoking, make a plan to help you succeed. Before you quit:  Pick a date to quit. Set a date within the next 2 weeks to give you time to prepare.  Write down the reasons why you are quitting. Keep this list in places where you will see it often.  Tell your family, friends, and co-workers that you are quitting. Their support is important.  Talk with your doctor about the choices that may help you quit.  Find out if your health insurance will pay for these treatments.  Know the people, places, things, and activities that make you want to smoke (triggers). Avoid them. What first steps can I take to quit smoking?  Throw away all cigarettes at home, at work, and in your car.  Throw away the things that you use when you smoke, such as ashtrays and lighters.  Clean your car. Make sure to empty the ashtray.  Clean your home, including curtains and carpets. What can I do to help me quit smoking? Talk with your doctor about taking medicines and seeing a counselor at the same time. You are more likely to succeed when you do both.  If you are pregnant or breastfeeding, talk with your doctor about counseling or other ways to  quit smoking. Do not take medicine to help you quit smoking unless your doctor tells you to do so. To quit smoking: Quit right away  Quit smoking totally, instead of slowly cutting back on how much you smoke over a period of time.  Go to counseling. You are more likely to quit if you go to counseling sessions regularly. Take medicine You may take medicines to help you quit. Some medicines need a prescription, and some you can buy over-the-counter. Some medicines may contain a drug called nicotine to replace the nicotine in cigarettes. Medicines may:  Help you to stop having the desire to smoke (cravings).  Help to stop the problems that come when you stop smoking (withdrawal symptoms). Your doctor may ask you to use:  Nicotine patches, gum, or lozenges.  Nicotine inhalers or sprays.  Non-nicotine medicine that is taken by mouth. Find resources Find resources and other ways to help you quit smoking and remain smoke-free after you quit. These resources are most helpful when you use them often. They include:  Online chats with a Social worker.  Phone quitlines.  Printed Furniture conservator/restorer.  Support groups or group counseling.  Text messaging programs.  Mobile phone apps. Use apps on your mobile phone or tablet that can help you stick to your quit plan. There are many free apps for mobile phones and tablets as well as websites. Examples include Quit Guide from  the CDC and smokefree.gov  What things can I do to make it easier to quit?   Talk to your family and friends. Ask them to support and encourage you.  Call a phone quitline (1-800-QUIT-NOW), reach out to support groups, or work with a Social worker.  Ask people who smoke to not smoke around you.  Avoid places that make you want to smoke, such as: ? Bars. ? Parties. ? Smoke-break areas at work.  Spend time with people who do not smoke.  Lower the stress in your life. Stress can make you want to smoke. Try these things to  help your stress: ? Getting regular exercise. ? Doing deep-breathing exercises. ? Doing yoga. ? Meditating. ? Doing a body scan. To do this, close your eyes, focus on one area of your body at a time from head to toe. Notice which parts of your body are tense. Try to relax the muscles in those areas. How will I feel when I quit smoking? Day 1 to 3 weeks Within the first 24 hours, you may start to have some problems that come from quitting tobacco. These problems are very bad 2-3 days after you quit, but they do not often last for more than 2-3 weeks. You may get these symptoms:  Mood swings.  Feeling restless, nervous, angry, or annoyed.  Trouble concentrating.  Dizziness.  Strong desire for high-sugar foods and nicotine.  Weight gain.  Trouble pooping (constipation).  Feeling like you may vomit (nausea).  Coughing or a sore throat.  Changes in how the medicines that you take for other issues work in your body.  Depression.  Trouble sleeping (insomnia). Week 3 and afterward After the first 2-3 weeks of quitting, you may start to notice more positive results, such as:  Better sense of smell and taste.  Less coughing and sore throat.  Slower heart rate.  Lower blood pressure.  Clearer skin.  Better breathing.  Fewer sick days. Quitting smoking can be hard. Do not give up if you fail the first time. Some people need to try a few times before they succeed. Do your best to stick to your quit plan, and talk with your doctor if you have any questions or concerns. Summary  Smoking tobacco is the leading cause of preventable death. Quitting smoking can be hard, but it is one of the best things that you can do for your health.  When you decide to quit smoking, make a plan to help you succeed.  Quit smoking right away, not slowly over a period of time.  When you start quitting, seek help from your doctor, family, or friends. This information is not intended to replace  advice given to you by your health care provider. Make sure you discuss any questions you have with your health care provider. Document Revised: 10/10/2018 Document Reviewed: 04/05/2018 Elsevier Patient Education  [place].

## 2019-12-29 ENCOUNTER — Ambulatory Visit: Payer: Medicaid Other | Admitting: Gastroenterology

## 2020-01-01 DIAGNOSIS — I10 Essential (primary) hypertension: Secondary | ICD-10-CM | POA: Insufficient documentation

## 2020-01-04 ENCOUNTER — Telehealth: Payer: Self-pay

## 2020-01-04 NOTE — Telephone Encounter (Signed)
Spoke with patient regarding results and recommendation.  Patient verbalizes understanding and is agreeable to plan of care. Advised patient to call back with any issues or concerns.  

## 2020-01-04 NOTE — Telephone Encounter (Signed)
-----   Message from Richardo Priest, MD sent at 01/04/2020  7:40 AM EST ----- Good result no concerning findings of heart beat

## 2020-01-06 ENCOUNTER — Other Ambulatory Visit: Payer: Self-pay

## 2020-01-06 ENCOUNTER — Telehealth: Payer: Self-pay

## 2020-01-06 ENCOUNTER — Ambulatory Visit (HOSPITAL_BASED_OUTPATIENT_CLINIC_OR_DEPARTMENT_OTHER)
Admission: RE | Admit: 2020-01-06 | Discharge: 2020-01-06 | Disposition: A | Discharge status: Home / Self Care | Payer: Medicaid Other | Source: Ambulatory Visit | Attending: Cardiology | Admitting: Cardiology

## 2020-01-06 DIAGNOSIS — I340 Nonrheumatic mitral (valve) insufficiency: Secondary | ICD-10-CM | POA: Diagnosis not present

## 2020-01-06 DIAGNOSIS — I361 Nonrheumatic tricuspid (valve) insufficiency: Secondary | ICD-10-CM | POA: Diagnosis not present

## 2020-01-06 DIAGNOSIS — R002 Palpitations: Secondary | ICD-10-CM | POA: Insufficient documentation

## 2020-01-06 LAB — ECHOCARDIOGRAM COMPLETE
Area-P 1/2: 3.93 cm2
S' Lateral: 3.03 cm

## 2020-01-06 NOTE — Telephone Encounter (Signed)
Spoke with patient regarding results and recommendation.  Patient verbalizes understanding and is agreeable to plan of care. Advised patient to call back with any issues or concerns.  

## 2020-01-06 NOTE — Telephone Encounter (Signed)
-----   Message from Richardo Priest, MD sent at 01/06/2020 12:31 PM EST ----- Good result normal echocardiogram

## 2020-01-11 NOTE — Progress Notes (Signed)
Cardiology Office Note:    Date:  01/12/2020   ID:  Brooke Chapman, DOB October 22, 1960, MRN [id number]  PCP:  Ladell Pier, MD  Cardiologist:  Shirlee More, MD    Referring MD: Ladell Pier, MD    ASSESSMENT:    1. Palpitations   2. Essential hypertension   3. Pure hypercholesterolemia   4. PAD (peripheral artery disease) (HCC)   5. Other iron deficiency anemia    PLAN:    In order of problems listed above:  1. She is improved evaluation shows no structural heart disease or significant arrhythmia for now and focus on blood pressure control increasing her beta-blocker strongly encourage you check your blood pressure at home and contact me if she has another flare. 2. Not at target increase carvedilol and she is scheduled follow-up with her PCP 3. Continue her statin with peripheral arterial disease 4. Anemia may be playing a role in her cardiovascular symptoms she will continue iron supplements and may need parenteral IV iron   Next appointment: As needed   Medication Adjustments/Labs and Tests Ordered: Current medicines are reviewed at length with the patient today.  Concerns regarding medicines are outlined above.  No orders of the defined types were placed in this encounter.  Meds ordered this encounter  Medications  . carvedilol (COREG) 25 MG tablet    Sig: Take 1 tablet (25 mg total) by mouth 2 (two) times daily.    Dispense:  180 tablet    Refill:  3    No chief complaint on file.   History of Present Illness:    Brooke Chapman is a 59 y.o. female with a hx of hypertension, hyperlipidemia and PAD last seen 12/18/2019 for palpitation. Compliance with diet, lifestyle and medications: yes  Chart review shows that she has iron deficiency with a ferritin of 7 and saturation of 3%. 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  The right common femoral artery was heavily diseased with stenosis greater than 50% of the profunda.  The SFA is occluded and reconstitutes below the knee at the popliteal artery.  Echo 01/06/2020:  1. Left ventricular ejection fraction, by estimation, is 55 to 60%. The  left ventricle has normal function. The left ventricle has no regional  wall motion abnormalities. Left ventricular diastolic parameters are  consistent with Grade I diastolic  dysfunction (impaired relaxation).   2. Right ventricular systolic function is normal. The right ventricular  size is normal. There is normal pulmonary artery systolic pressure.   3. The mitral valve is normal in structure. Mild mitral valve  regurgitation. No evidence of mitral stenosis.   4. The aortic valve is tricuspid. Aortic valve regurgitation is not  visualized. Mild aortic valve sclerosis is present, with no evidence of  aortic valve stenosis.   5. The inferior vena cava is normal in size with greater than 50%  respiratory variability, suggesting right atrial pressure of 3 mmHg.   Event monitor: Study Highlights A ZIO monitor was performed for 3 days beginning 12/18/2019 to assess palpitation. Cardiac rhythm throughout was sinus with average, minimum of maximum heart rates of 83, 1620 bpm. There were no pauses of 3 seconds or greater and no episodes of second or third-degree AV block or  sinus node exit block. Ventricular ectopy was rare with isolated PVCs and couplets. Supraventricular ectopy was rare and there were no episodes of atrial fibrillation or flutter. There were 7 triggered events 2 associated with frequent PVCs and the others were an accelerated junctional rhythm at a rate of approximately 100 bpm.   Conclusion: Although the frequency of ventricular ectopy is rare there is symptomatic PVCs, also brief accelerated junctional  rhythm.  Her palpitation has resolved she is not sure we caught her issue I offered her repeat monitor her using the home iPhone adapter she declines at this time.  She thinks that her blood pressure not being at target is the issue when she is likely correct blood pressure today 150-160/90 and will increase the dose of her beta-blocker.  If she has severe palpitation I asked her to contact me.  She wants to follow-up with her primary care physician I will see back in my office as needed. Past Medical History:  Diagnosis Date  . Essential hypertension   . HTN (hypertension) 05/01/2018  . Hyperlipidemia   . Hypertension   . PAD (peripheral artery disease) ([place])   . PVD (peripheral vascular disease) ([place]) 08/26/2018    Past Surgical History:  Procedure Laterality Date  . ABDOMINAL AORTOGRAM W/LOWER EXTREMITY Bilateral 07/13/2019   Procedure: ABDOMINAL AORTOGRAM W/LOWER EXTREMITY;  Surgeon: Waynetta Sandy, MD;  Location: [facility];  Service: Cardiovascular;  Laterality: Bilateral;  . BREAST EXCISIONAL BIOPSY Left    LESS THAN 5 YEARS AGO  . PERIPHERAL VASCULAR INTERVENTION Left 07/13/2019   Procedure: PERIPHERAL VASCULAR INTERVENTION;  Surgeon: Waynetta Sandy, MD;  Location: [facility];  Service: Cardiovascular;  Laterality: Left;  common iliac    Current Medications: Current Meds  Medication Sig  . amLODipine (NORVASC) 10 MG tablet Take 1 tablet (10 mg total) by mouth daily. Must have office visit for refills  . aspirin 81 MG chewable tablet Chew 81 mg by mouth daily.  Marland Kitchen atorvastatin (LIPITOR) 20 MG tablet Take 1.5 tablets (30 mg total) by mouth daily.  . Blood Pressure Monitor DEVI Use as directed to check home blood pressure 2-3 times a week  . buPROPion (WELLBUTRIN SR) 150 MG 12 hr tablet Take 1 tablet (150 mg total) by mouth 2 (two) times daily.  . cholecalciferol (VITAMIN D3) 25 MCG (1000 UT) tablet Take 1,000 Units by mouth daily.  . cilostazol  (PLETAL) 100 MG tablet Take 1 tablet by mouth twice daily  . ferrous sulfate 325 (65 FE) MG tablet Take 1 tablet (325 mg total) by mouth 2 (two) times daily with a meal.  . hydrochlorothiazide (HYDRODIURIL) 12.5 MG tablet Take 1 tablet (12.5 mg total) by mouth daily.  Marland Kitchen omeprazole (PRILOSEC) 40 MG capsule Take 1 capsule (40 mg total) by mouth daily.  . [DISCONTINUED] carvedilol (COREG) 12.5 MG tablet Take 1 tablet (12.5 mg total) by mouth 2 (two) times daily.     Allergies:   Latex   Social History   Socioeconomic History  . Marital status: Single    Spouse name: Not on file  . Number of children: Not on file  . Years of education: Not on file  . Highest education level: Not on file  Occupational History  . Not on file  Tobacco Use  . Smoking status: Current Every Day Smoker    Packs/day: 0.50    Types: Cigarettes  . Smokeless tobacco: Never Used  . Tobacco comment: 6-10 cigs daily  Vaping  Use  . Vaping Use: Never used  Substance and Sexual Activity  . Alcohol use: Yes    Comment: rare  . Drug use: Never  . Sexual activity: Not Currently  Other Topics Concern  . Not on file  Social History Narrative  . Not on file   Social Determinants of Health   Financial Resource Strain: Not on file  Food Insecurity: Not on file  Transportation Needs: Not on file  Physical Activity: Not on file  Stress: Not on file  Social Connections: Not on file     Family History: The patient's family history includes Heart disease in her mother. ROS:   Please see the history of present illness.    All other systems reviewed and are negative.  EKGs/Labs/Other Studies Reviewed:    The following studies were reviewed today:  Recent Labs: 05/11/2019: ALT 9 07/13/2019: BUN 8; Creatinine, Ser 0.90; Potassium 3.8; Sodium 142 11/26/2019: Hemoglobin 9.0; Platelets 403; TSH 0.755  Recent Lipid Panel    Component Value Date/Time   CHOL 184 05/11/2019 1534   TRIG 142 05/11/2019 1534   HDL 64  05/11/2019 1534   CHOLHDL 2.9 05/11/2019 1534   LDLCALC 95 05/11/2019 1534    Physical Exam:    VS:  BP (!) 158/90 (BP Location: Right Arm, Patient Position: Sitting, Cuff Size: Normal)   Pulse 71   Ht 5\' 4"  (1.626 m)   Wt 144 lb 1.9 oz (65.4 kg)   SpO2 100%   BMI 24.74 kg/m     Wt Readings from Last 3 Encounters:  01/12/20 144 lb 1.9 oz (65.4 kg)  12/28/19 144 lb (65.3 kg)  12/18/19 144 lb (65.3 kg)     GEN:  Well nourished, well developed in no acute distress HEENT: Normal NECK: No JVD; No carotid bruits LYMPHATICS: No lymphadenopathy CARDIAC: RRR, no murmurs, rubs, gallops RESPIRATORY:  Clear to auscultation without rales, wheezing or rhonchi  ABDOMEN: Soft, non-tender, non-distended MUSCULOSKELETAL:  No edema; No deformity  SKIN: Warm and dry NEUROLOGIC:  Alert and oriented x 3 PSYCHIATRIC:  Normal affect    Signed, Shirlee More, MD  01/12/2020 9:58 AM    [place]

## 2020-01-12 ENCOUNTER — Ambulatory Visit (INDEPENDENT_AMBULATORY_CARE_PROVIDER_SITE_OTHER): Payer: Medicaid Other | Admitting: Cardiology

## 2020-01-12 ENCOUNTER — Other Ambulatory Visit: Payer: Self-pay

## 2020-01-12 ENCOUNTER — Encounter: Payer: Self-pay | Admitting: Cardiology

## 2020-01-12 VITALS — BP 158/90 | HR 71 | Ht 64.0 in | Wt 144.1 lb

## 2020-01-12 DIAGNOSIS — D508 Other iron deficiency anemias: Secondary | ICD-10-CM | POA: Diagnosis not present

## 2020-01-12 DIAGNOSIS — I739 Peripheral vascular disease, unspecified: Secondary | ICD-10-CM

## 2020-01-12 DIAGNOSIS — R002 Palpitations: Secondary | ICD-10-CM | POA: Diagnosis not present

## 2020-01-12 DIAGNOSIS — I10 Essential (primary) hypertension: Secondary | ICD-10-CM | POA: Diagnosis not present

## 2020-01-12 DIAGNOSIS — E7800 Pure hypercholesterolemia, unspecified: Secondary | ICD-10-CM | POA: Diagnosis not present

## 2020-01-12 MED ORDER — CARVEDILOL 25 MG PO TABS: 25.0000 mg | ORAL_TABLET | Freq: Two times a day (BID) | ORAL | 3 refills | Status: DC

## 2020-01-12 NOTE — Patient Instructions (Signed)
Medication Instructions:  Your physician has recommended you make the following change in your medication:  INCREASE: Coreg 25 mg take one tablet by mouth twice daily.  *If you need a refill on your cardiac medications before your next appointment, please call your pharmacy*   Lab Work: None If you have labs (blood work) drawn today and your tests are completely normal, you will receive your results only by: Marland Kitchen MyChart Message (if you have MyChart) OR . A paper copy in the mail If you have any lab test that is abnormal or we need to change your treatment, we will call you to review the results.   Testing/Procedures: None   Follow-Up: At [facility], you and your health needs are our priority.  As part of our continuing mission to provide you with exceptional heart care, we have created designated Provider Care Teams.  These Care Teams include your primary Cardiologist (physician) and Advanced Practice Providers (APPs -  Physician Assistants and Nurse Practitioners) who all work together to provide you with the care you need, when you need it.  We recommend signing up for the patient portal called "MyChart".  Sign up information is provided on this After Visit Summary.  MyChart is used to connect with patients for Virtual Visits (Telemedicine).  Patients are able to view lab/test results, encounter notes, upcoming appointments, etc.  Non-urgent messages can be sent to your provider as well.   To learn more about what you can do with MyChart, go to [email or web address].    Your next appointment:   As needed  The format for your next appointment:   In Person  Provider:   Shirlee More, MD   Other Instructions

## 2020-01-13 ENCOUNTER — Encounter: Payer: Self-pay | Admitting: Gastroenterology

## 2020-01-13 ENCOUNTER — Telehealth: Payer: Self-pay | Admitting: *Deleted

## 2020-01-13 ENCOUNTER — Ambulatory Visit: Payer: Medicaid Other | Admitting: Gastroenterology

## 2020-01-13 VITALS — BP 160/100 | HR 80 | Ht 64.0 in | Wt 141.2 lb

## 2020-01-13 DIAGNOSIS — R634 Abnormal weight loss: Secondary | ICD-10-CM

## 2020-01-13 DIAGNOSIS — Z7902 Long term (current) use of antithrombotics/antiplatelets: Secondary | ICD-10-CM

## 2020-01-13 DIAGNOSIS — K921 Melena: Secondary | ICD-10-CM

## 2020-01-13 DIAGNOSIS — D509 Iron deficiency anemia, unspecified: Secondary | ICD-10-CM

## 2020-01-13 DIAGNOSIS — I739 Peripheral vascular disease, unspecified: Secondary | ICD-10-CM | POA: Diagnosis not present

## 2020-01-13 HISTORY — DX: Abnormal weight loss: R63.4

## 2020-01-13 HISTORY — DX: Long term (current) use of antithrombotics/antiplatelets: Z79.02

## 2020-01-13 HISTORY — DX: Melena: K92.1

## 2020-01-13 MED ORDER — PLENVU 140 G PO SOLR: 1.0000 | Freq: Once | ORAL | 0 refills | Status: AC

## 2020-01-13 NOTE — Patient Instructions (Signed)
If you are age 61 or older, your body mass index should be between 23-30. Your Body mass index is 24.25 kg/m. If this is out of the aforementioned range listed, please consider follow up with your Primary Care Provider.  If you are age 72 or younger, your body mass index should be between 19-25. Your Body mass index is 24.25 kg/m. If this is out of the aformentioned range listed, please consider follow up with your Primary Care Provider.   You have been scheduled for an endoscopy and colonoscopy. Please follow the written instructions given to you at your visit today. Please pick up your prep supplies at the pharmacy within the next 1-3 days. If you use inhalers (even only as needed), please bring them with you on the day of your procedure.  Due to recent changes in healthcare laws, you may see the results of your imaging and laboratory studies on MyChart before your provider has had a chance to review them.  We understand that in some cases there may be results that are confusing or concerning to you. Not all laboratory results come back in the same time frame and the provider may be waiting for multiple results in order to interpret others.  Please give [place] 48 hours in order for your provider to thoroughly review all the results before contacting the office for clarification of your results.

## 2020-01-13 NOTE — Progress Notes (Signed)
Agree with the assessment and plan as outlined by Alonza Bogus, PA-C.  Agree with plan for expedited EGD/colonoscopy for melena, weight loss, IDA.  Please have patient also hold oral iron 7 days prior to procedure.  If EGD/colonoscopy unrevealing, plan for VCE.  Jorgen Wolfinger, DO, [facility]

## 2020-01-13 NOTE — Progress Notes (Signed)
01/13/2020 Brooke Chapman [id number] Feb 03, 1960   HISTORY OF PRESENT ILLNESS:  This is a 59 year old female who is knew to our office.  She was referred here by her PCP, Dr. Wynetta Emery, for evaluation regarding iron deficiency anemia, black stools, weight loss.  The patient's hemoglobin back in June was 14.5 g.  When it was checked in October was down to 9 g.  Iron studies were low with a ferritin of 7, iron saturation 3%, and serum iron 13.  She reports having black stools quite regularly.  She also reports close to 15 pound weight loss over the past couple of months without trying.  She is now back on ferrous sulfate 325 mg twice daily.  She tells me that she had issues with anemia in the past, but when she had her last colonoscopy in [place] about 3 or 4 years ago before moving to [place] they took her off of it because her blood counts were good.  She says that that was the time of her last colonoscopy.  We do not have records.  As far she is aware she is never had an endoscopy.  She denies any abdominal pain.  She says that she has been having some constipation issues recently since starting the iron supplements.  There are orders in from her PCP to have repeat labs performed in the near future.  She is on Pletal for history of peripheral arterial and peripheral vascular disease.   Past Medical History:  Diagnosis Date  . Anemia   . Essential hypertension   . HTN (hypertension) 05/01/2018  . Hyperlipidemia   . Hypertension   . PAD (peripheral artery disease) ([place])   . PVD (peripheral vascular disease) ([place]) 08/26/2018   Past Surgical History:  Procedure Laterality Date  . ABDOMINAL AORTOGRAM W/LOWER EXTREMITY Bilateral 07/13/2019   Procedure: ABDOMINAL AORTOGRAM W/LOWER EXTREMITY;  Surgeon: Waynetta Sandy, MD;  Location: [facility];  Service: Cardiovascular;  Laterality: Bilateral;  . BREAST EXCISIONAL BIOPSY Left    LESS THAN 5 YEARS AGO  . PERIPHERAL VASCULAR  INTERVENTION Left 07/13/2019   Procedure: PERIPHERAL VASCULAR INTERVENTION;  Surgeon: Waynetta Sandy, MD;  Location: [facility];  Service: Cardiovascular;  Laterality: Left;  common iliac    reports that she has been smoking cigarettes. She has been smoking about 0.50 packs per day. She has never used smokeless tobacco. She reports current alcohol use. She reports that she does not use drugs. family history includes Heart disease in her maternal aunt and mother; Hyperlipidemia in her maternal aunt and mother; Hypertension in her brother, maternal aunt, and mother. Allergies  Allergen Reactions  . Latex Rash      Outpatient Encounter Medications as of 01/13/2020  Medication Sig  . amLODipine (NORVASC) 10 MG tablet Take 1 tablet (10 mg total) by mouth daily. Must have office visit for refills  . aspirin 81 MG chewable tablet Chew 81 mg by mouth daily.  Marland Kitchen atorvastatin (LIPITOR) 20 MG tablet Take 1.5 tablets (30 mg total) by mouth daily.  . Blood Pressure Monitor DEVI Use as directed to check home blood pressure 2-3 times a week  . buPROPion (WELLBUTRIN SR) 150 MG 12 hr tablet Take 1 tablet (150 mg total) by mouth 2 (two) times daily.  . carvedilol (COREG) 25 MG tablet Take 1 tablet (25 mg total) by mouth 2 (two) times daily.  . cholecalciferol (VITAMIN D3) 25 MCG (1000 UT) tablet Take 1,000  Units by mouth daily.  . ferrous sulfate 325 (65 FE) MG tablet Take 1 tablet (325 mg total) by mouth 2 (two) times daily with a meal.  . hydrochlorothiazide (HYDRODIURIL) 12.5 MG tablet Take 1 tablet (12.5 mg total) by mouth daily.  Marland Kitchen omeprazole (PRILOSEC) 40 MG capsule Take 1 capsule (40 mg total) by mouth daily.  . cilostazol (PLETAL) 100 MG tablet Take 1 tablet by mouth twice daily (Patient not taking: Reported on 01/13/2020)  . PEG-KCl-NaCl-NaSulf-Na Asc-C (PLENVU) 140 g SOLR Take 1 kit by mouth once for 1 dose.   No facility-administered encounter medications on file as of 01/13/2020.     REVIEW OF SYSTEMS  : All other systems reviewed and negative except where noted in the History of Present Illness.   PHYSICAL EXAM: BP (!) 160/100 (BP Location: Left Arm, Patient Position: Sitting, Cuff Size: Normal)   Pulse 80   Ht _0  (1.626 m) Comment: height measured without shoes  Wt 141 lb 4 oz (64.1 kg)   BMI 24.25 kg/m  General: Well developed black female in no acute distress Head: Normocephalic and atraumatic Eyes:  Sclerae anicteric, conjunctiva pink. Ears: Normal auditory acuity Lungs: Clear throughout to auscultation; no W/R/R. Heart: Regular rate and rhythm; no M/R/G. Abdomen: Soft, non-distended.  BS present.  Non-tender. Rectal:  Will be done at the time of colonoscopy. Musculoskeletal: Symmetrical with no gross deformities  Skin: No lesions on visible extremities Extremities: No edema  Neurological: Alert oriented x 4, grossly non-focal Psychological:  Alert and cooperative. Normal mood and affect  ASSESSMENT AND PLAN: *59 year old with 5.5 g drop in her hemoglobin from June to October.  Reports black stools.  Also reports weight loss of about 15 pounds over the past couple of months unintentionally.  Is now on ferrous sulfate 325 mg twice a day.  Reports colonoscopy 3 to 4 years ago in [place].  We do not have those records.  I think that with this drop in hemoglobin and the weight loss she needs repeat colonoscopy and endoscopy.  We will plan for these with Dr. Bryan Lemma as he had first double endoscopy appointment available.  There are orders in for repeat labs that she is supposed to have drawn in the near future or her PCP.  May want to consider IV iron infusions.  We will give 2-day bowel prep as she has been struggling with constipation recently due to the iron supplements.  The risks, benefits, and alternatives to EGD and colonoscopy were discussed with the patient and she consents to proceed.  Need to rule out ulcer, esophagitis, malignancy, etc. in the  setting of antiplatelet use with Pletal. *Chronic antiplatelet use with Pletal for history of peripheral vascular disease/peripheral arterial disease:  Hold Pletal for 2 days before procedure - will instruct when and how to resume after procedure. Risks and benefits of procedure including bleeding, perforation, infection, missed lesions, medication reactions and possible hospitalization or surgery if complications occur explained. Additional rare but real risk of cardiovascular event such as heart attack or ischemia/infarct of other organs off Pletal explained and need to seek urgent help if this occurs. Will communicate by phone or EMR with patient's prescribing provider that to confirm that holding Pletal is reasonable in this case.    CC:  Ladell Pier, MD

## 2020-01-13 NOTE — Telephone Encounter (Signed)
   YANIA BOGIE 24-Jan-1961 [id number]  Dear Dr. Wynetta Emery:  We have scheduled the above named patient for a(n) endoscopy/colonoscopy procedure. Our records show that (s)he is on anticoagulation therapy.  Please advise as to whether the patient may come off their therapy of Pletal 2 days prior to their procedure which is scheduled for Thursday 01/21/20.  Please route your response to Caryl Asp, [place] or fax response to [phone].  Sincerely,   Caryl Asp, [facility]

## 2020-01-14 NOTE — Progress Notes (Signed)
Informed patient to hold Iron starting today.

## 2020-01-14 NOTE — Telephone Encounter (Signed)
Patient informed to hold Pletal for 2 days. Patient voiced understanding.

## 2020-01-15 ENCOUNTER — Telehealth: Payer: Self-pay | Admitting: Gastroenterology

## 2020-01-15 NOTE — Telephone Encounter (Signed)
Spoke with patient and confirmed she stopped her Iron yesterday and to stop her Pletal on Tuesday and we will tell her when to restart after her procedure on Thursday. Patient voiced understanding.

## 2020-01-19 ENCOUNTER — Other Ambulatory Visit: Payer: Self-pay | Admitting: Gastroenterology

## 2020-01-21 ENCOUNTER — Other Ambulatory Visit: Payer: Self-pay

## 2020-01-21 ENCOUNTER — Ambulatory Visit (AMBULATORY_SURGERY_CENTER): Payer: Medicaid Other | Admitting: Gastroenterology

## 2020-01-21 ENCOUNTER — Encounter: Payer: Self-pay | Admitting: Gastroenterology

## 2020-01-21 VITALS — BP 172/98 | HR 66 | Temp 98.2°F | Resp 30 | Ht 64.0 in | Wt 141.0 lb

## 2020-01-21 DIAGNOSIS — D122 Benign neoplasm of ascending colon: Secondary | ICD-10-CM | POA: Diagnosis not present

## 2020-01-21 DIAGNOSIS — K635 Polyp of colon: Secondary | ICD-10-CM | POA: Diagnosis not present

## 2020-01-21 DIAGNOSIS — D509 Iron deficiency anemia, unspecified: Secondary | ICD-10-CM

## 2020-01-21 DIAGNOSIS — K921 Melena: Secondary | ICD-10-CM | POA: Diagnosis not present

## 2020-01-21 DIAGNOSIS — D649 Anemia, unspecified: Secondary | ICD-10-CM | POA: Diagnosis not present

## 2020-01-21 DIAGNOSIS — K2970 Gastritis, unspecified, without bleeding: Secondary | ICD-10-CM

## 2020-01-21 DIAGNOSIS — K634 Enteroptosis: Secondary | ICD-10-CM

## 2020-01-21 DIAGNOSIS — D127 Benign neoplasm of rectosigmoid junction: Secondary | ICD-10-CM

## 2020-01-21 DIAGNOSIS — B9681 Helicobacter pylori [H. pylori] as the cause of diseases classified elsewhere: Secondary | ICD-10-CM

## 2020-01-21 DIAGNOSIS — K5730 Diverticulosis of large intestine without perforation or abscess without bleeding: Secondary | ICD-10-CM

## 2020-01-21 DIAGNOSIS — K621 Rectal polyp: Secondary | ICD-10-CM | POA: Diagnosis not present

## 2020-01-21 DIAGNOSIS — D125 Benign neoplasm of sigmoid colon: Secondary | ICD-10-CM

## 2020-01-21 DIAGNOSIS — D129 Benign neoplasm of anus and anal canal: Secondary | ICD-10-CM

## 2020-01-21 LAB — SARS CORONAVIRUS 2 (TAT 6-24 HRS): SARS Coronavirus 2: NEGATIVE

## 2020-01-21 MED ORDER — SODIUM CHLORIDE 0.9 % IV SOLN: 500.0000 mL | Freq: Once | INTRAVENOUS | Status: DC

## 2020-01-21 NOTE — Progress Notes (Signed)
Called to room to assist during endoscopic procedure.  Patient ID and intended procedure confirmed with present staff. Received instructions for my participation in the procedure from the performing physician.  

## 2020-01-21 NOTE — Progress Notes (Signed)
Vitals-CW  Pt's states no medical or surgical changes since previsit or office visit.  Patient is slurring her words.  Her bp is high. Patient states that she doesn't take her medicines She denies symptoms.  CRNA notified Osvaldo Angst).  He will inform the doctor.

## 2020-01-21 NOTE — Patient Instructions (Signed)
Handouts given:  Diverticulosis, Polyps Resume previous diet Continue current medications Await pathology results If biopsies are unrevealing, plan for a video capsule endoscopy   YOU HAD AN ENDOSCOPIC PROCEDURE TODAY AT [facility]:   Refer to the procedure report that was given to you for any specific questions about what was found during the examination.  If the procedure report does not answer your questions, please call your gastroenterologist to clarify.  If you requested that your care partner not be given the details of your procedure findings, then the procedure report has been included in a sealed envelope for you to review at your convenience later.  YOU SHOULD EXPECT: Some feelings of bloating in the abdomen. Passage of more gas than usual.  Walking can help get rid of the air that was put into your GI tract during the procedure and reduce the bloating. If you had a lower endoscopy (such as a colonoscopy or flexible sigmoidoscopy) you may notice spotting of blood in your stool or on the toilet paper. If you underwent a bowel prep for your procedure, you may not have a normal bowel movement for a few days.  Please Note:  You might notice some irritation and congestion in your nose or some drainage.  This is from the oxygen used during your procedure.  There is no need for concern and it should clear up in a day or so.  SYMPTOMS TO REPORT IMMEDIATELY:   Following lower endoscopy (colonoscopy or flexible sigmoidoscopy):  Excessive amounts of blood in the stool  Significant tenderness or worsening of abdominal pains  Swelling of the abdomen that is new, acute  Fever of 100F or higher   Following upper endoscopy (EGD)  Vomiting of blood or coffee ground material  New chest pain or pain under the shoulder blades  Painful or persistently difficult swallowing  New shortness of breath  Fever of 100F or higher  Black, tarry-looking stools  For urgent or emergent  issues, a gastroenterologist can be reached at any hour by calling [phone]. Do not use MyChart messaging for urgent concerns.   DIET:  We do recommend a small meal at first, but then you may proceed to your regular diet.  Drink plenty of fluids but you should avoid alcoholic beverages for 24 hours.  ACTIVITY:  You should plan to take it easy for the rest of today and you should NOT DRIVE or use heavy machinery until tomorrow (because of the sedation medicines used during the test).    FOLLOW UP: Our staff will call the number listed on your records 48-72 hours following your procedure to check on you and address any questions or concerns that you may have regarding the information given to you following your procedure. If we do not reach you, we will leave a message.  We will attempt to reach you two times.  During this call, we will ask if you have developed any symptoms of COVID 19. If you develop any symptoms (ie: fever, flu-like symptoms, shortness of breath, cough etc.) before then, please call [phone].  If you test positive for Covid 19 in the 2 weeks post procedure, please call and report this information to [place].    If any biopsies were taken you will be contacted by phone or by letter within the next 1-3 weeks.  Please call us at [phone] if you have not heard about the biopsies in 3 weeks.   SIGNATURES/CONFIDENTIALITY: You and/or your care partner have  signed paperwork which will be entered into your electronic medical record.  These signatures attest to the fact that that the information above on your After Visit Summary has been reviewed and is understood.  Full responsibility of the confidentiality of this discharge information lies with you and/or your care-partner.

## 2020-01-21 NOTE — Op Note (Signed)
[facility] Patient Name: Brooke Chapman Procedure Date: 01/21/2020 1:39 PM MRN: [id number] Endoscopist: Gerrit Heck , MD Age: 59 Referring MD:  Date of Birth: 02-15-1960 Gender: Female Account #: [id number] Procedure:                Upper GI endoscopy Indications:              Iron deficiency anemia, Weight loss Medicines:                Monitored Anesthesia Care Procedure:                Pre-Anesthesia Assessment:                           - Prior to the procedure, a History and Physical                            was performed, and patient medications and                            allergies were reviewed. The patient's tolerance of                            previous anesthesia was also reviewed. The risks                            and benefits of the procedure and the sedation                            options and risks were discussed with the patient.                            All questions were answered, and informed consent                            was obtained. Prior Anticoagulants: The patient has                            taken no previous anticoagulant or antiplatelet                            agents. ASA Grade Assessment: III - A patient with                            severe systemic disease. After reviewing the risks                            and benefits, the patient was deemed in                            satisfactory condition to undergo the procedure.                           After obtaining informed consent, the endoscope was  passed under direct vision. Throughout the                            procedure, the patient's blood pressure, pulse, and                            oxygen saturations were monitored continuously. The                            Endoscope was introduced through the mouth, and                            advanced to the second part of duodenum. The upper                            GI endoscopy was  accomplished without difficulty.                            The patient tolerated the procedure well. Scope In: Scope Out: Findings:                 The examined esophagus was normal.                           The entire examined stomach was normal. Biopsies                            were taken with a cold forceps for Helicobacter                            pylori testing. Estimated blood loss was minimal.                           The examined duodenum was normal. Biopsies for                            histology were taken with a cold forceps for                            evaluation of celiac disease. Estimated blood loss                            was minimal. Complications:            No immediate complications. Estimated Blood Loss:     Estimated blood loss was minimal. Impression:               - Normal esophagus.                           - Normal stomach. Biopsied.                           - Normal examined duodenum. Biopsied. Recommendation:           - Patient has a contact number available for  emergencies. The signs and symptoms of potential                            delayed complications were discussed with the                            patient. Return to normal activities tomorrow.                            Written discharge instructions were provided to the                            patient.                           - Resume previous diet.                           - Continue present medications.                           - Await pathology results. Gerrit Heck, MD 01/21/2020 2:37:05 PM

## 2020-01-21 NOTE — Op Note (Signed)
[facility] Patient Name: Brooke Chapman Procedure Date: 01/21/2020 1:39 PM MRN: [id number] Endoscopist: Gerrit Heck , MD Age: 59 Referring MD:  Date of Birth: [birth year] Gender: Female Account #: [id number] Procedure:                Colonoscopy Indications:              Iron deficiency anemia, Weight loss Medicines:                Monitored Anesthesia Care Procedure:                Pre-Anesthesia Assessment:                           - Prior to the procedure, a History and Physical                            was performed, and patient medications and                            allergies were reviewed. The patient's tolerance of                            previous anesthesia was also reviewed. The risks                            and benefits of the procedure and the sedation                            options and risks were discussed with the patient.                            All questions were answered, and informed consent                            was obtained. Prior Anticoagulants: The patient has                            taken no previous anticoagulant or antiplatelet                            agents. ASA Grade Assessment: III - A patient with                            severe systemic disease. After reviewing the risks                            and benefits, the patient was deemed in                            satisfactory condition to undergo the procedure.                           After obtaining informed consent, the colonoscope  was passed under direct vision. Throughout the                            procedure, the patient's blood pressure, pulse, and                            oxygen saturations were monitored continuously. The                            Olympus [id number]-[id number]) Colonoscope was                            introduced through the anus and advanced to the the                            cecum, identified by  appendiceal orifice and                            ileocecal valve. The colonoscopy was performed                            without difficulty. The patient tolerated the                            procedure well. The quality of the bowel                            preparation was good. The ileocecal valve,                            appendiceal orifice, and rectum were photographed. Scope In: 2:02:56 PM Scope Out: 2:33:31 PM Scope Withdrawal Time: 0 hours 26 minutes 29 seconds  Total Procedure Duration: 0 hours 30 minutes 35 seconds  Findings:                 The perianal and digital rectal examinations were                            normal.                           A 15 mm polyp was found in the proximal ascending                            colon. The polyp was flat. The polyp was removed                            with a hot snare. Resection and retrieval were                            complete. Estimated blood loss was minimal.                           Two sessile polyps were found in the sigmoid colon.  The polyps were 3 to 4 mm in size. These polyps                            were removed with a cold snare. Resection and                            retrieval were complete. Estimated blood loss was                            minimal.                           Multiple sessile polyps were found in the rectum                            and recto-sigmoid colon. The polyps were 2 to 4 mm                            in size. Eight of these polyps were removed with a                            cold snare for histologic representative                            evaluation. Resection and retrieval were complete.                            Estimated blood loss was minimal. Estimated blood                            loss was minimal.                           A few small-mouthed diverticula were found in the                            sigmoid colon, descending  colon and transverse                            colon.                           Retroflexion in the rectum was not performed due to                            anatomy (narrow, short base rectal vault).                            Anterograde views were otherwise normal. Complications:            No immediate complications. Estimated Blood Loss:     Estimated blood loss was minimal. Impression:               - One 15 mm polyp in the proximal ascending colon,  removed with a hot snare. Resected and retrieved.                           - Two 3 to 4 mm polyps in the sigmoid colon,                            removed with a cold snare. Resected and retrieved.                           - Multiple 2 to 4 mm polyps in the rectum and at                            the recto-sigmoid colon, removed with a cold snare.                            Resected and retrieved.                           - Diverticulosis in the sigmoid colon, in the                            descending colon and in the transverse colon. Recommendation:           - Patient has a contact number available for                            emergencies. The signs and symptoms of potential                            delayed complications were discussed with the                            patient. Return to normal activities tomorrow.                            Written discharge instructions were provided to the                            patient.                           - Resume previous diet.                           - Continue present medications.                           - Await pathology results.                           - Repeat colonoscopy for surveillance based on                            pathology results.                           -  Follow-up with Alonza Bogus in the GI office at                            appointment to be scheduled.                           - If biopsies unrevealing, plan for  perform video                            capsule endoscopy for interrogation of the small                            bowel. Gerrit Heck, MD 01/21/2020 2:44:33 PM

## 2020-01-21 NOTE — Progress Notes (Signed)
A/ox3, pleased with MAC, report to RN 

## 2020-01-25 ENCOUNTER — Telehealth: Payer: Self-pay | Admitting: *Deleted

## 2020-01-25 NOTE — Telephone Encounter (Signed)
No answer/busy signal for post procedure call back.

## 2020-02-04 ENCOUNTER — Telehealth: Payer: Self-pay | Admitting: General Surgery

## 2020-02-04 DIAGNOSIS — A048 Other specified bacterial intestinal infections: Secondary | ICD-10-CM

## 2020-02-04 DIAGNOSIS — D509 Iron deficiency anemia, unspecified: Secondary | ICD-10-CM

## 2020-02-04 MED ORDER — METRONIDAZOLE 250 MG PO TABS: 250.0000 mg | ORAL_TABLET | Freq: Four times a day (QID) | ORAL | 0 refills | Status: AC

## 2020-02-04 MED ORDER — DOXYCYCLINE HYCLATE 100 MG PO CAPS: 100.0000 mg | ORAL_CAPSULE | Freq: Two times a day (BID) | ORAL | 0 refills | Status: AC

## 2020-02-04 NOTE — Telephone Encounter (Signed)
Left a detailed message on the patients voicemail that she has h Pylori and we needed to send out prescriptions to eradicate it. I told her that I will send a message through my chart but she should contact the office as well to get directions.

## 2020-02-04 NOTE — Telephone Encounter (Signed)
-----   Message from Parkin V, DO sent at 02/04/2020  1:17 PM EST ----- The biopsies taken during the recent upper endoscopy and colonoscopy are as follows:  Colonoscopy: - 3 of the polyps removed wer benign, but precancerous, Tubular Adenomas.  These were resected entirely at the time of colonoscopy. - The remainder of the polyps taken from the distal colon were benign Hyperplastic Polyps.  These types of polyps harbor no malignant potential.    Upper Endoscopy: -The biopsies taken from your small intestine were normal and there was no evidence of Celiac Disease.  -The biopsies taken from the stomach were notable for Helicobacter pylori infection.  We will plan on treating as below.  Please confirm no allergies to the listed treatment plan.  1) Omeprazole 20 mg 2 times a day x 14 d 2) Pepto Bismol 2 tabs (262 mg each) 4 times a day x 14 d 3) Metronidazole 250 mg 4 times a day x 14 d 4) doxycycline 100 mg 2 times a day x 14 d  After 14 days, ok to stop omeprazole.  4 weeks after treatment completed, check H. Pylori stool antigen to confirm eradication (must be off acid suppression therapy)  Dx: H. Pylori gastritis  Plan for follow-up in the GI clinic.  Repeat CBC and iron panel in 2-3 months after treatment of H. pylori to evaluate for resolution.  If persistent IDA, plan for video capsule endoscopy.

## 2020-02-25 ENCOUNTER — Encounter: Payer: Self-pay | Admitting: Gastroenterology

## 2020-02-25 ENCOUNTER — Ambulatory Visit: Payer: Medicaid Other | Admitting: Gastroenterology

## 2020-02-25 VITALS — BP 156/98 | HR 80 | Ht 64.0 in | Wt 153.2 lb

## 2020-02-25 DIAGNOSIS — I739 Peripheral vascular disease, unspecified: Secondary | ICD-10-CM

## 2020-02-25 DIAGNOSIS — A048 Other specified bacterial intestinal infections: Secondary | ICD-10-CM | POA: Diagnosis not present

## 2020-02-25 DIAGNOSIS — D509 Iron deficiency anemia, unspecified: Secondary | ICD-10-CM

## 2020-02-25 DIAGNOSIS — R21 Rash and other nonspecific skin eruption: Secondary | ICD-10-CM

## 2020-02-25 DIAGNOSIS — I10 Essential (primary) hypertension: Secondary | ICD-10-CM | POA: Diagnosis not present

## 2020-02-25 DIAGNOSIS — Z860100 Personal history of colon polyps, unspecified: Secondary | ICD-10-CM

## 2020-02-25 NOTE — Patient Instructions (Signed)
If you are age 62 or older, your body mass index should be between 23-30. Your Body mass index is 26.31 kg/m. If this is out of the aforementioned range listed, please consider follow up with your Primary Care Provider.  If you are age 29 or younger, your body mass index should be between 19-25. Your Body mass index is 26.31 kg/m. If this is out of the aformentioned range listed, please consider follow up with your Primary Care Provider.   Please go to the lab on the 2nd floor [address] in 4 weeks to do labs. You can stop by there today before you leave to make this appointment.   Stop Antibiotic, Pepto, Omeprazole.   Make an appointment with your Primary Care Provider.  Repeat Colonoscopy in 3 years.   It was a pleasure to see you today!  Vito Cirigliano, D.O.

## 2020-02-25 NOTE — Progress Notes (Signed)
P  Chief Complaint:    Rash, hives, procedure follow-up, H. pylori gastritis  GI History: 60 year old female with history of HTN, HLD, PAD (on Pletal), PVD, referred to the GI clinic in 12/2019 for evaluation of iron deficiency anemia, dark stools, weight loss.  Reports having a history of anemia in the past, with colonoscopy in [place] approximately 2017 prior to moving to [facility].  -04/2019: H/H 13.3/40.5, MCV/RDW 92/13.5 -06/2019: H/H 14.6/43 -10/2019: H/H 9/29.9, MCV/RDW 78/17.  Ferritin 7, iron 13, sat 3%.  Started on oral iron -12/2019: EGD/colonoscopy.  EGD with mild H. pylori gastritis treated with quadruple therapy.  Colonoscopy with tubular adenoma x3 and rectal hyperplastic polyps   Endoscopic History: -Colonoscopy approximately 2017 in [place].  Reportedly normal per patient.  No records available for review -EGD (12/2019, Dr. Bryan Lemma): Normal (duodenal biopsies negative for celiac), H. pylori gastritis.  Started quadruple therapy -Colonoscopy (12/2019, Dr. Bryan Lemma): 15 mm flat polyp in the ascending, 2 sigmoid polyps, rectal hyperplastic polyps, diverticulosis.  Repeat 3 years  HPI:     Patient is a 60 y.o. female presenting to the Gastroenterology Clinic for follow-up.  Initially seen by Alonza Bogus in 12/2019 with subsequent EGD/colonoscopy as above.  Was prescribed quadruple therapy for H. pylori gastritis.  Duodenal biopsies were negative.  Colonoscopy with 3 tubular adenomas, including a 15 mm flat adenoma.  States she developed hives/rash within 3 days of starting Abx for H pylori, but she kept taking all meds (but typically forgets to take the 4th dose of metronidazole each day).  Has just 1-2 days left of ABX.  Rash not present currently.  Otherwise no fever, flushing, chest pain, shortness of breath.  Good appetite.  Developed constipation with PO iron. Has changed to 1 tab QOD with improvement back to baseline stools. Eats lots of fruits, vegetables, chicken, fish,  pork.   No new labs for review.  Review of systems:     No chest pain, no SOB, no fevers, no urinary sx   Past Medical History:  Diagnosis Date  . Anemia   . Essential hypertension   . HTN (hypertension) 05/01/2018  . Hyperlipidemia   . Hypertension   . PAD (peripheral artery disease) ([place])   . PVD (peripheral vascular disease) ([place]) 08/26/2018    Patient's surgical history, family medical history, social history, medications and allergies were all reviewed in Epic    Current Outpatient Medications  Medication Sig Dispense Refill  . amLODipine (NORVASC) 10 MG tablet Take 1 tablet (10 mg total) by mouth daily. Must have office visit for refills 30 tablet 6  . aspirin 81 MG chewable tablet Chew 81 mg by mouth daily.    Marland Kitchen atorvastatin (LIPITOR) 20 MG tablet Take 1.5 tablets (30 mg total) by mouth daily. 135 tablet 3  . Blood Pressure Monitor DEVI Use as directed to check home blood pressure 2-3 times a week 1 Device 0  . buPROPion (WELLBUTRIN SR) 150 MG 12 hr tablet Take 1 tablet (150 mg total) by mouth 2 (two) times daily. 60 tablet 1  . carvedilol (COREG) 25 MG tablet Take 1 tablet (25 mg total) by mouth 2 (two) times daily. 180 tablet 3  . cholecalciferol (VITAMIN D3) 25 MCG (1000 UT) tablet Take 1,000 Units by mouth daily.    . cilostazol (PLETAL) 100 MG tablet Take 1 tablet by mouth twice daily 180 tablet 0  . ferrous sulfate 325 (65 FE) MG tablet Take 1 tablet (325 mg total) by mouth 2 (  two) times daily with a meal. 100 tablet 1  . hydrochlorothiazide (HYDRODIURIL) 12.5 MG tablet Take 1 tablet (12.5 mg total) by mouth daily. 90 tablet 3  . omeprazole (PRILOSEC) 40 MG capsule Take 1 capsule (40 mg total) by mouth daily. 30 capsule 3   No current facility-administered medications for this visit.    Physical Exam:     BP (!) 156/98   Pulse 80   Ht 5\' 4"  (1.626 m)   Wt 153 lb 4 oz (69.5 kg)   BMI 26.31 kg/m   GENERAL:  Pleasant female in NAD PSYCH: : Cooperative, normal  affect EENT:  conjunctiva pink, mucous membranes moist, neck supple without masses SKIN:  turgor, no lesions seen. No rash present Musculoskeletal:  Normal muscle tone, normal strength NEURO: Alert and oriented x 3, no focal neurologic deficits   IMPRESSION and PLAN:    1) Iron deficiency anemia 2) H. pylori gastritis  - Stop Abx (only has 1-2 days left per patient) due to reported rash - Stop pepto Bismol - Stop omeprazole with plan for H pylori stool Ag in 4 weeks - Recheck CBC and iron panel in 4 weeks. If still deficient, will plan on VCE -Continue oral iron.  Taking QOD d/t GI side effect profile.  May need to consider IV iron pending serologic response -Continue consuming iron rich foods  3) Rash -No rash present on exam today, reports developing mild rash after starting antibiotics.  She otherwise continue taking the antibiotics as prescribed.  Stopping as above now that she is 10+ days into her treatment course, which is hopefully sufficient for treatment of H. pylori -Observe for resolution after stopping probable offending agents -to follow-up with PCM  4) History of colon polyps -Repeat colonoscopy in 2024 for ongoing polyp surveillance  5) Hypertension 6) Peripheral vascular disease - Schedule f/u with PCM re: HTN and PAD treatment/antiplatelet management   RTC in 3-6 months or sooner as needed  I spent 30 minutes of time, including in depth chart review, independent review of results as outlined above, communicating results with the patient directly, face-to-face time with the patient, coordinating care, and ordering studies and medications as appropriate, and documentation.          Lavena Bullion ,DO, FACG 02/25/2020, 8:23 AM

## 2020-02-29 ENCOUNTER — Telehealth: Payer: Self-pay | Admitting: Internal Medicine

## 2020-02-29 NOTE — Telephone Encounter (Signed)
Pt called and is requesting to know if she should be placed back on the blood thinners. Pt could not remember the name. She states that if so she will need a new prescription sent to pharamacy. Pt also states that she is swelling in her legs since 02/26/20. Please advise.      [address]  [address] [place] [address]  Phone: [phone] Fax: [phone]  Hours: Not open 24 hours

## 2020-02-29 NOTE — Telephone Encounter (Signed)
Will forward to pcp

## 2020-03-01 NOTE — Telephone Encounter (Signed)
Contacted pt to go over Dr. Wynetta Emery response pt is aware. Pt states she is still having swelling in her legs. Pt has been schedule to see Cari at [facility] on 2/3 830am

## 2020-03-03 ENCOUNTER — Ambulatory Visit: Payer: Medicaid Other | Admitting: Physician Assistant

## 2020-03-04 ENCOUNTER — Ambulatory Visit: Payer: Medicaid Other | Admitting: Internal Medicine

## 2020-03-14 ENCOUNTER — Telehealth: Payer: Self-pay

## 2020-03-14 NOTE — Telephone Encounter (Signed)
Contacted patient to leave message concerning location for upcoming appt with the [facility].

## 2020-03-17 ENCOUNTER — Ambulatory Visit: Payer: Medicaid Other

## 2020-03-21 DIAGNOSIS — L815 Leukoderma, not elsewhere classified: Secondary | ICD-10-CM | POA: Diagnosis not present

## 2020-03-21 DIAGNOSIS — L659 Nonscarring hair loss, unspecified: Secondary | ICD-10-CM | POA: Diagnosis not present

## 2020-03-23 ENCOUNTER — Other Ambulatory Visit: Payer: Self-pay

## 2020-03-23 ENCOUNTER — Other Ambulatory Visit (INDEPENDENT_AMBULATORY_CARE_PROVIDER_SITE_OTHER): Payer: Medicaid Other

## 2020-03-23 DIAGNOSIS — A048 Other specified bacterial intestinal infections: Secondary | ICD-10-CM | POA: Diagnosis not present

## 2020-03-23 DIAGNOSIS — D509 Iron deficiency anemia, unspecified: Secondary | ICD-10-CM | POA: Diagnosis not present

## 2020-03-23 LAB — CBC
HCT: 35.3 % — ABNORMAL LOW (ref 36.0–46.0)
Hemoglobin: 11.7 g/dL — ABNORMAL LOW (ref 12.0–15.0)
MCHC: 33.3 g/dL (ref 30.0–36.0)
MCV: 91.5 fl (ref 78.0–100.0)
Platelets: 316 10*3/uL (ref 150.0–400.0)
RBC: 3.85 Mil/uL — ABNORMAL LOW (ref 3.87–5.11)
RDW: 17 % — ABNORMAL HIGH (ref 11.5–15.5)
WBC: 5.3 10*3/uL (ref 4.0–10.5)

## 2020-03-24 ENCOUNTER — Telehealth: Payer: Self-pay | Admitting: General Surgery

## 2020-03-24 DIAGNOSIS — D508 Other iron deficiency anemias: Secondary | ICD-10-CM

## 2020-03-24 LAB — IRON AND TIBC
Iron Saturation: 10 % — ABNORMAL LOW (ref 15–55)
Iron: 26 ug/dL — ABNORMAL LOW (ref 27–139)
Total Iron Binding Capacity: 273 ug/dL (ref 250–450)
UIBC: 247 ug/dL (ref 118–369)

## 2020-03-24 LAB — SPECIMEN STATUS REPORT

## 2020-03-24 LAB — FERRITIN: Ferritin: 20 ng/mL (ref 15–150)

## 2020-03-24 NOTE — Telephone Encounter (Signed)
Contacted the patient no answer left a detailed message regarding the patients iron deficiency and she should coninue her iron therapy as prescibed. Place order for labs to be drawn in 3 month. Patient was asked to contact the office with any questions.

## 2020-03-24 NOTE — Telephone Encounter (Signed)
-----   Message from [place], DO sent at 03/24/2020  1:09 PM EST ----- Labs demonstrate continued iron deficiency, although improved iron indices.  Given overall improvement in iron and hemoglobin, plan for continued oral iron therapy as prescribed (with every other day dosing) with repeat labs in 3 months.  If normalization, plan for stopping oral iron and monitoring.  If still deficient despite continued oral iron therapy, plan for IV iron and possibly VCE.

## 2020-03-25 DIAGNOSIS — D509 Iron deficiency anemia, unspecified: Secondary | ICD-10-CM | POA: Diagnosis not present

## 2020-03-25 DIAGNOSIS — A048 Other specified bacterial intestinal infections: Secondary | ICD-10-CM | POA: Diagnosis not present

## 2020-03-28 ENCOUNTER — Encounter: Payer: Self-pay | Admitting: General Surgery

## 2020-03-29 DIAGNOSIS — L659 Nonscarring hair loss, unspecified: Secondary | ICD-10-CM | POA: Diagnosis not present

## 2020-03-29 DIAGNOSIS — L815 Leukoderma, not elsewhere classified: Secondary | ICD-10-CM | POA: Diagnosis not present

## 2020-04-04 DIAGNOSIS — L930 Discoid lupus erythematosus: Secondary | ICD-10-CM | POA: Diagnosis not present

## 2020-04-07 DIAGNOSIS — L659 Nonscarring hair loss, unspecified: Secondary | ICD-10-CM | POA: Diagnosis not present

## 2020-04-07 DIAGNOSIS — L930 Discoid lupus erythematosus: Secondary | ICD-10-CM | POA: Diagnosis not present

## 2020-04-07 DIAGNOSIS — Z4802 Encounter for removal of sutures: Secondary | ICD-10-CM | POA: Diagnosis not present

## 2020-04-29 ENCOUNTER — Other Ambulatory Visit: Payer: Self-pay

## 2020-04-29 ENCOUNTER — Ambulatory Visit: Payer: Medicaid Other | Attending: Internal Medicine | Admitting: Internal Medicine

## 2020-04-29 ENCOUNTER — Encounter: Payer: Self-pay | Admitting: Internal Medicine

## 2020-04-29 VITALS — BP 189/119 | HR 86 | Resp 16 | Wt 131.2 lb

## 2020-04-29 DIAGNOSIS — R519 Headache, unspecified: Secondary | ICD-10-CM

## 2020-04-29 DIAGNOSIS — F411 Generalized anxiety disorder: Secondary | ICD-10-CM | POA: Diagnosis not present

## 2020-04-29 DIAGNOSIS — R3589 Other polyuria: Secondary | ICD-10-CM | POA: Diagnosis not present

## 2020-04-29 DIAGNOSIS — R634 Abnormal weight loss: Secondary | ICD-10-CM | POA: Diagnosis not present

## 2020-04-29 DIAGNOSIS — I10 Essential (primary) hypertension: Secondary | ICD-10-CM

## 2020-04-29 DIAGNOSIS — M329 Systemic lupus erythematosus, unspecified: Secondary | ICD-10-CM

## 2020-04-29 DIAGNOSIS — F17200 Nicotine dependence, unspecified, uncomplicated: Secondary | ICD-10-CM

## 2020-04-29 DIAGNOSIS — D509 Iron deficiency anemia, unspecified: Secondary | ICD-10-CM

## 2020-04-29 LAB — POCT GLYCOSYLATED HEMOGLOBIN (HGB A1C): HbA1c, POC (prediabetic range): 5.3 % — AB (ref 5.7–6.4)

## 2020-04-29 MED ORDER — CLONIDINE HCL 0.1 MG PO TABS
0.1000 mg | ORAL_TABLET | Freq: Once | ORAL | Status: AC
  Administered 2020-04-29: 0.1 mg via ORAL

## 2020-04-29 MED ORDER — AMLODIPINE BESYLATE 10 MG PO TABS: 10.0000 mg | ORAL_TABLET | Freq: Every day | ORAL | 6 refills | Status: DC

## 2020-04-29 MED ORDER — SERTRALINE HCL 50 MG PO TABS: 25.0000 mg | ORAL_TABLET | Freq: Every day | ORAL | 0 refills | Status: DC

## 2020-04-29 MED ORDER — ATORVASTATIN CALCIUM 20 MG PO TABS
30.0000 mg | ORAL_TABLET | Freq: Every day | ORAL | 3 refills | Status: DC
Start: 2020-04-29 — End: 2020-11-09

## 2020-04-29 NOTE — Patient Instructions (Signed)
Please take Coreg 25 mg twice a day as prescribed for blood pressure. Stop Plavix. Start Zoloft for anxiety

## 2020-04-29 NOTE — Progress Notes (Signed)
Patient ID: Brooke Chapman, female    DOB: [birth year]  MRN: [id number]  CC: Hypertension   Subjective: Brooke Chapman is a 60 y.o. female who presents for chronic ds management Her concerns today include:  Patient with history of PAD left leg, HTN, tobacco dependence, HL, urge incont, IDA  Pt has med bottles with her.  Anemia/weight loss/tarry stools: Since last visit with me she has completed EGD and colonoscopy by gastroenterologist Dr.Cirigliano.  EGD negative but did test positive for H. pylori gastritis.  Treated except for last 2 days of medications as patient reported having a rash with the medicines. 4 polyps removed via c-scope, 3 were tubular adenomas.  Repeat colonoscopy in 3 years. -repeat labs 2 mths ago:  ferritin improved from 7 to 20 and Hb from 9 to 11.7.  Advised to continue oral iron supplement.  Taking iron QOD because it causes constipation. -advised to stop Plavix several mths ago.  However she has both Pletal and Plavix bottles with her and reports she is taking both -based on scale today, she has loss additional 13 lbs since last visit with me in 11/2019 -appetite not what it should be.  "Food does not taste good so I eat like a bird." -endorses polyuria and polydipsia.  DM screen in past negative.  Past TSH nl. -worried about all of the new health issues over the past several mths.  Thinks she has anxiety attacks.  Feels anxious a lot.  Has episodes of nervousness and feeling wobbly about 3-4 times a week and feeling like she would pass out.  Episodes can last for 2 to 3 hours.  Mom pass from heart ds yrs ago.  Does not feel depress or that depression playing a role Would like to pursue disability so that she no longer has to work given her current issues.  HYPERTENSION/Palpitations Currently taking: see medication list.  Is supposed to be on amlodipine, hydrochlorothiazide and carvedilol.  On last visit with Dr. Lemar Livings he had increase the carvedilol to 25 mg twice a  day to help decrease symptomatic palpitations.  Has Coreg 25 mg BID and 12.5 mg BID bottles with her today.  Tells me she is still taking the 12.5 mg and has not started the 25 mg bottle as yet. -reportedly has pill box at home which she fills -cardiac work up done:  Echo -EF 55 to 60% with no regional wall motion abnormality, grade 1 diastolic dysfunction. Zio monitor worn for 3 days: Majority rhythm sinus.  Rare PVCs, rare SV ectopy.  Min-Max HR 83-?162 (not clear from cardiologist note).  7 triggered events 2 associated with frequent PVCs and the episodes were an accelerated junctional rhythm at a rate of about 100 bpm.  According to the cardiologist note the patient felt that the device did not capture everything.  He offered to have her repeated but she declined. Adherence with salt restriction: [x]  Yes    []  No Home Monitoring?: [x]  Yes  -occasionally -still gets palpitations "here and there." + dizziness and SOB when she has palpitations.  No CP or LE edema +HA x 1 mth in temple and frontal areas BL. Associated with nausea.  No photophobia.  Dizziness only when she has palpitations and when she feels anxious.  No fever.  Relieve with laying down and going to sleep.  Not taking anything for HA.    TOb Dep: less 1/2 pk from 1 pk/day.  Tried Smoke Away pill that she mentioned to me  on last visit.  Found them to be helpful.  Plans to get more.  Actively trying to quit.  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  ANA ordered but she did not have this done. -Saw the dermatologist a few wks ago and had biopsy from her cheek and scalp. 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 Not sure if discoid vs systemic type.  I do not think I received bx report from [place] as yet to verify.   Patient Active Problem List   Diagnosis Date Noted  . Black stools 01/13/2020  . Loss of weight 01/13/2020  . Antiplatelet or  antithrombotic long-term use 01/13/2020  . Hypertension   . Frontal balding 12/28/2019  . Iron deficiency anemia 12/28/2019  . Essential hypertension   . 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 PVD (peripheral vascular disease) ([place]) 08/26/2018  . Hyperlipidemia 05/02/2018  . HTN (hypertension) 05/01/2018  . Tobacco dependence 05/01/2018  . PAD (peripheral artery disease) ([place])      Current Outpatient Medications on File Prior to Visit  Medication Sig Dispense Refill  . aspirin 81 MG chewable tablet Chew 81 mg by mouth daily.    . Blood Pressure Monitor DEVI Use as directed to check home blood pressure 2-3 times a week 1 Device 0  . carvedilol (COREG) 25 MG tablet Take 1 tablet (25 mg total) by mouth 2 (two) times daily. 180 tablet 3  . cholecalciferol (VITAMIN D3) 25 MCG (1000 UT) tablet Take 1,000 Units by mouth daily.    . cilostazol (PLETAL) 100 MG tablet Take 1 tablet by mouth twice daily 180 tablet 0  . ferrous sulfate 325 (65 FE) MG tablet Take 1 tablet (325 mg total) by mouth 2 (two) times daily with a meal. 100 tablet 1  . hydrochlorothiazide (HYDRODIURIL) 12.5 MG tablet Take 1 tablet (12.5 mg total) by mouth daily. 90 tablet 3  . omeprazole (PRILOSEC) 40 MG capsule Take 1 capsule (40 mg total) by mouth daily. 30 capsule 3   No current facility-administered medications on file prior to visit.    Allergies  Allergen Reactions  . Latex Rash    Social History   Socioeconomic History  . Marital status: Single    Spouse name: Not on file  . Number of children: 0  . Years of education: Not on file  . Highest education level: Not on file  Occupational History  . Occupation: Prep cook  Tobacco Use  . Smoking status: Current Every Day Smoker    Packs/day: 0.50    Types: Cigarettes  . Smokeless tobacco: Never Used  . Tobacco comment: 6-10 cigs daily  Vaping Use  . Vaping Use: Never used  Substance and Sexual Activity  . Alcohol use: Yes    Comment: 1 per day  . Drug use: Never   . Sexual activity: Not Currently  Other Topics Concern  . Not on file  Social History Narrative  . Not on file   Social Determinants of Health   Financial Resource Strain: Not on file  Food Insecurity: Not on file  Transportation Needs: Not on file  Physical Activity: Not on file  Stress: Not on file  Social Connections: Not on file  Intimate Partner Violence: Not on file    Family History  Problem Relation Age of Onset  . Heart disease Mother   . Hypertension Mother   . Hyperlipidemia Mother   . Hypertension  Brother   . Heart disease Maternal Aunt   . Hypertension Maternal Aunt   . Hyperlipidemia Maternal Aunt   . Colon cancer Neg Hx   . Liver cancer Neg Hx   . Rectal cancer Neg Hx   . Stomach cancer Neg Hx     Past Surgical History:  Procedure Laterality Date  . ABDOMINAL AORTOGRAM W/LOWER EXTREMITY Bilateral 07/13/2019   Procedure: ABDOMINAL AORTOGRAM W/LOWER EXTREMITY;  Surgeon: Waynetta Sandy, MD;  Location: [place] [facility];  Service: Cardiovascular;  Laterality: Bilateral;  . BREAST EXCISIONAL BIOPSY Left    LESS THAN 5 YEARS AGO  . PERIPHERAL VASCULAR INTERVENTION Left 07/13/2019   Procedure: PERIPHERAL VASCULAR INTERVENTION;  Surgeon: Waynetta Sandy, MD;  Location: [facility];  Service: Cardiovascular;  Laterality: Left;  common iliac    ROS: Review of Systems Negative except as stated above  PHYSICAL EXAM: BP (!) 189/119   Pulse 86   Resp 16   Wt 131 lb 3.2 oz (59.5 kg)   SpO2 95%   BMI 22.52 kg/m   Wt Readings from Last 3 Encounters:  04/29/20 131 lb 3.2 oz (59.5 kg)  02/25/20 153 lb 4 oz (69.5 kg)  01/21/20 141 lb (64 kg)   BP/pulse sitting 203/136, P 83 Standing 210/144, P 82  Physical Exam  General appearance - alert, well appearing, older AAF and in no distress Mental status - normal mood, behavior, speech, dress, motor activity, and thought processes.  Seems a bit forgetful and jumps from one thought to  another.  I have had to redirect her several times. Eyes -non-icteric sclera, slightly pale conjunctiva Mouth - mucous membranes moist, pharynx normal without lesions Neck - supple, no significant adenopathy Chest - clear to auscultation, no wheezes, rales or rhonchi, symmetric air entry Heart - normal rate, regular rhythm, normal S1, S2, no murmurs, rubs, clicks or gallops Abdomen - soft, nontender, nondistended, no masses or organomegaly Extremities -no lower extremity edema Neurologic exam: Cranial nerves grossly intact.  No tenderness over the temporal arteries.  Power 5/5 throughout in both upper and lower extremities.  Gross sensation intact.  Gait is normal.  Romberg negative GAD 7 : Generalized Anxiety Score 04/29/2020 12/28/2019 11/26/2019 11/02/2019  Nervous, Anxious, on Edge - 0 0 0  Control/stop worrying 3 0 0 0  Worry too much - different things 1 0 0 0  Trouble relaxing 0 0 0 0  Restless 0 0 0 0  Easily annoyed or irritable 0 0 0 0  Afraid - awful might happen 1 0 0 0  Total GAD 7 Score - 0 0 0    Depression screen [facility] 2/9 04/29/2020 12/28/2019 11/26/2019  Decreased Interest 0 0 0  Down, Depressed, Hopeless 0 0 0  PHQ - 2 Score 0 0 0  Altered sleeping - - -  Tired, decreased energy - - -  Change in appetite - - -  Feeling bad or failure about yourself  - - -  Trouble concentrating - - -  Moving slowly or fidgety/restless - - -  Suicidal thoughts - - -  PHQ-9 Score - - -   Lab Results  Component Value Date   HGBA1C 5.3 (A) 04/29/2020     CMP Latest Ref Rng & Units 04/29/2020 07/13/2019 05/11/2019  Glucose 65 - 99 mg/dL 96 88 90  BUN 8 - 27 mg/dL 15 8 13   Creatinine 0.57 - 1.00 mg/dL 1.20(H) 0.90 0.91  Sodium 134 - 144 mmol/L 134  142 142  Potassium 3.5 - 5.2 mmol/L 3.1(L) 3.8 4.1  Chloride 96 - 106 mmol/L 89(L) 100 101  CO2 20 - 29 mmol/L 29 - 23  Calcium 8.7 - 10.3 mg/dL 9.2 - 9.2  Total Protein 6.0 - 8.5 g/dL 7.3 - 7.5  Total Bilirubin 0.0 - 1.2 mg/dL <0.2 - <0.2   Alkaline Phos 44 - 121 IU/L 82 - 111  AST 0 - 40 IU/L 29 - 21  ALT 0 - 32 IU/L 14 - 9   Lipid Panel     Component Value Date/Time   CHOL 184 05/11/2019 1534   TRIG 142 05/11/2019 1534   HDL 64 05/11/2019 1534   CHOLHDL 2.9 05/11/2019 1534   LDLCALC 95 05/11/2019 1534    CBC    Component Value Date/Time   WBC 5.3 03/23/2020 0813   RBC 3.85 (L) 03/23/2020 0813   HGB 11.7 (L) 03/23/2020 0813   HGB 9.0 (L) 11/26/2019 1226   HCT 35.3 (L) 03/23/2020 0813   HCT 29.9 (L) 11/26/2019 1226   PLT 316.0 03/23/2020 0813   PLT 403 11/26/2019 1226   MCV 91.5 03/23/2020 0813   MCV 78 (L) 11/26/2019 1226   MCH 23.4 (L) 11/26/2019 1226   MCH 31.1 04/10/2018 1203   MCHC 33.3 03/23/2020 0813   RDW 17.0 (H) 03/23/2020 0813   RDW 17.2 (H) 11/26/2019 1226   LYMPHSABS 3.6 (H) 05/01/2018 1556   EOSABS 0.0 05/01/2018 1556   BASOSABS 0.0 05/01/2018 1556    ASSESSMENT AND PLAN: 1. Essential hypertension Not at goal.  Reports having taken meds already for the morning and compliance with meds. Still on Coreg 12.5 BID but should be on 25 mg BID.  Advised patient to take 2 of the 12.5 mg tablets twice a day until she runs out then start the 25 mg tablet which she will take 1 in the morning and 1 in the evening.  We made notes on her current bottle to help her remember. -Stressed importance of limiting salt in the diet. Given dose Clonidine today.  Repeat BP after 20 mins was 211/121.  Advise to take evening dose of Coreg when she gets home -f/u with clinical pharmacist in 1 wk for BP recheck.  Advised to check BP 2-3 x over the next wk and bring readings with her to that visit.  If BP still not at goal, we can add Hydralazine or Cozaar. - amLODipine (NORVASC) 10 MG tablet; Take 1 tablet (10 mg total) by mouth daily. Must have office visit for refills  Dispense: 30 tablet; Refill: 6 - cloNIDine (CATAPRES) tablet 0.1 mg  2. Iron deficiency anemia, unspecified iron deficiency anemia type Improved on  iron supplement.  Advised to take stool softener like Miralax daily if she has to to help prevent constipation. -Advised to stop Plavix.  Contiue ASA and Pletal Recheck CBC today   3. Tobacco dependence Pt is current smoker. Patient advised to quit smoking. Discussed health risks associated with smoking including lung and other types of cancers, chronic lung diseases and CV risks.. Pt currently working on trying to quit   Discussed methods to help quit including quitting cold [place], use of NRT, Chantix and Bupropion.  Pt wanting to try: states she plans to get more of a pill call Smoke Away which she has purchased off the internet.  She is not sure the ingredients but states it has helped her.  Advised to set a quit date _3_ Minutes spent on counseling. F/U:  Readdress on one of her future visits.   4. Unintended weight loss Of ?? Etiology.  DM screen today negative.  Plan to recheck TSH. Reports poor appetite.  Encourage to eat 3 solid meals a day. I forgot to discuss and order HIV testing.  Will get on next visit or send pt message via Mychart Will discuss advance imaging including lung cancer screening if unexplained wgh loss persist. She is up to date with other age appropriate cancer screenings - CBC - Comprehensive metabolic panel - TSH  5. GAD (generalized anxiety disorder) -discuss management with CBT +/- med.  She declines referral to [facility] stating she does not want to see another doctor or provider -she is agreeable to trying low dose of Zoloft.  6. Polyuria A1C today within nl range  7. Daily headache Likely due to uncontrolled BP Stress importance of med compliance to achieve better BP control and prevent acute CV events.  Pt given dose of Clonidine today  - POCT glycosylated hemoglobin (Hb A1C)  8. 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  Will have my CMA get records from [facility] to verify - ANA w/Reflex if Positive - Anti-DNA  antibody, double-stranded   I spent 50  Minutes with this pt in face to face interaction and reviewing specialist reports with her.  Near the end of this encounter, pt asked if her significant other who was in the lobby could come in to give further details about her symptoms.  I told her that we are at the end of the visit but if she would like for him to come in with her on next visit, she can do so.  Pt also asked about letter to support her getting disability.  Advise that this would have to be address on subsequent visit. However, if she has started the process and has a Chief Executive Officer, they can request records.  Patient was given the opportunity to ask questions.  Patient verbalized understanding of the plan and was able to repeat key elements of the plan.   Orders Placed This Encounter  Procedures  . CBC  . Comprehensive metabolic panel  . ANA w/Reflex if Positive  . Anti-DNA antibody, double-stranded  . TSH  . POCT glycosylated hemoglobin (Hb A1C)     Requested Prescriptions   Signed Prescriptions Disp Refills  . amLODipine (NORVASC) 10 MG tablet 30 tablet 6    Sig: Take 1 tablet (10 mg total) by mouth daily. Must have office visit for refills  . atorvastatin (LIPITOR) 20 MG tablet 135 tablet 3    Sig: Take 1.5 tablets (30 mg total) by mouth daily.  . sertraline (ZOLOFT) 50 MG tablet 30 tablet 0    Sig: Take 0.5 tablets (25 mg total) by mouth daily.    Return in 3 weeks (on 05/20/2020) for F/U in 1 wk with [facility] for BP check.  Karle Plumber, MD, FACP

## 2020-04-30 ENCOUNTER — Other Ambulatory Visit: Payer: Self-pay | Admitting: Internal Medicine

## 2020-04-30 ENCOUNTER — Telehealth: Payer: Self-pay | Admitting: Internal Medicine

## 2020-04-30 DIAGNOSIS — R634 Abnormal weight loss: Secondary | ICD-10-CM

## 2020-04-30 DIAGNOSIS — E876 Hypokalemia: Secondary | ICD-10-CM

## 2020-04-30 LAB — COMPREHENSIVE METABOLIC PANEL
ALT: 14 IU/L (ref 0–32)
AST: 29 IU/L (ref 0–40)
Albumin/Globulin Ratio: 1.1 — ABNORMAL LOW (ref 1.2–2.2)
Albumin: 3.9 g/dL (ref 3.8–4.8)
Alkaline Phosphatase: 82 IU/L (ref 44–121)
BUN/Creatinine Ratio: 13 (ref 12–28)
BUN: 15 mg/dL (ref 8–27)
Bilirubin Total: <0.2 mg/dL (ref 0.0–1.2)
CO2: 29 mmol/L (ref 20–29)
Calcium: 9.2 mg/dL (ref 8.7–10.3)
Chloride: 89 mmol/L — ABNORMAL LOW (ref 96–106)
Creatinine, Ser: 1.2 mg/dL — ABNORMAL HIGH (ref 0.57–1.00)
Globulin, Total: 3.4 g/dL (ref 1.5–4.5)
Glucose: 96 mg/dL (ref 65–99)
Potassium: 3.1 mmol/L — ABNORMAL LOW (ref 3.5–5.2)
Sodium: 134 mmol/L (ref 134–144)
Total Protein: 7.3 g/dL (ref 6.0–8.5)
eGFR: 51 mL/min/{1.73_m2} — ABNORMAL LOW (ref >59)

## 2020-04-30 LAB — TSH: TSH: 1.16 u[IU]/mL (ref 0.450–4.500)

## 2020-04-30 LAB — ANA W/REFLEX IF POSITIVE: Anti Nuclear Antibody (ANA): NEGATIVE

## 2020-04-30 LAB — ANTI-DNA ANTIBODY, DOUBLE-STRANDED: dsDNA Ab: 1 IU/mL (ref 0–9)

## 2020-04-30 MED ORDER — POTASSIUM CHLORIDE ER 8 MEQ PO TBCR: 16.0000 meq | EXTENDED_RELEASE_TABLET | Freq: Every day | ORAL | 3 refills | Status: DC

## 2020-04-30 NOTE — Telephone Encounter (Signed)
Phone call placed to patient today to go over the results of her blood test.  I left a message on her voicemail just stating that I am Dr. Tortora calling to go over your lab results.  Advised that I will send the results to her MyChart account and please take a look at them.  Results for orders placed or performed in visit on 04/29/20  Comprehensive metabolic panel  Result Value Ref Range   Glucose 96 65 - 99 mg/dL   BUN 15 8 - 27 mg/dL   Creatinine, Ser 1.20 (H) 0.57 - 1.00 mg/dL   eGFR 51 (L) >59 mL/min/1.73   BUN/Creatinine Ratio 13 12 - 28   Sodium 134 134 - 144 mmol/L   Potassium 3.1 (L) 3.5 - 5.2 mmol/L   Chloride 89 (L) 96 - 106 mmol/L   CO2 29 20 - 29 mmol/L   Calcium 9.2 8.7 - 10.3 mg/dL   Total Protein 7.3 6.0 - 8.5 g/dL   Albumin 3.9 3.8 - 4.8 g/dL   Globulin, Total 3.4 1.5 - 4.5 g/dL   Albumin/Globulin Ratio 1.1 (L) 1.2 - 2.2   Bilirubin Total <0.2 0.0 - 1.2 mg/dL   Alkaline Phosphatase 82 44 - 121 IU/L   AST 29 0 - 40 IU/L   ALT 14 0 - 32 IU/L  ANA w/Reflex if Positive  Result Value Ref Range   Anti Nuclear Antibody (ANA) Negative Negative  Anti-DNA antibody, double-stranded  Result Value Ref Range   dsDNA Ab 1 0 - 9 IU/mL  TSH  Result Value Ref Range   TSH 1.160 0.450 - 4.500 uIU/mL  POCT glycosylated hemoglobin (Hb A1C)  Result Value Ref Range   Hemoglobin A1C     HbA1c POC (<> result, manual entry)     HbA1c, POC (prediabetic range) 5.3 (A) 5.7 - 6.4 %   HbA1c, POC (controlled diabetic range)      

## 2020-05-01 ENCOUNTER — Telehealth: Payer: Self-pay | Admitting: Internal Medicine

## 2020-05-01 DIAGNOSIS — F411 Generalized anxiety disorder: Secondary | ICD-10-CM

## 2020-05-01 HISTORY — DX: Generalized anxiety disorder: F41.1

## 2020-05-02 ENCOUNTER — Telehealth: Payer: Self-pay | Admitting: Internal Medicine

## 2020-05-02 NOTE — Telephone Encounter (Signed)
Called patient and LVM advising her I was calling from [facility] in regards to scheduling several appointments. If patient calls back please transfer her to office to get scheduled.

## 2020-05-03 NOTE — Telephone Encounter (Signed)
Contacted [employer] and spoke to medical records they will fax over notes

## 2020-05-04 ENCOUNTER — Encounter: Payer: Self-pay | Admitting: Internal Medicine

## 2020-05-04 NOTE — Progress Notes (Signed)
I received records from gastroenterologist Dr. Estella Husk in [facility] in [place]. Patient with diagnosis of iron deficiency anemia for which she underwent endoscopy on 05/16/2017 at [facility].  EGD revealed mild antral erythema but no ulcers.  Colonoscopy done the same day revealed melanosis otherwise negative. Labs done April 2019: B12 level 382 Total iron 54/iron binding capacity 289/percent saturation 19%/ferritin 33. H&H 14.1/42.3. In his note dated 05/30/2017 he states patient has iron deficiency anemia which has apparently resolved.  He recommended that she decrease iron supplement to 3 times a week and she should have it monitored every 3 to 6 months to see if there is any recurrence of anemia.

## 2020-05-07 ENCOUNTER — Other Ambulatory Visit: Payer: Self-pay | Admitting: Internal Medicine

## 2020-05-07 DIAGNOSIS — I739 Peripheral vascular disease, unspecified: Secondary | ICD-10-CM

## 2020-05-07 NOTE — Telephone Encounter (Signed)
Requested Prescriptions  Pending Prescriptions Disp Refills  . cilostazol (PLETAL) 100 MG tablet [Pharmacy Med Name: Cilostazol 100 MG Oral Tablet] 180 tablet 0    Sig: Take 1 tablet by mouth twice daily     Hematology: Antiplatelets - cilostazol Failed - 05/07/2020 10:16 AM      Failed - HCT in normal range and within 180 days    HCT  Date Value Ref Range Status  03/23/2020 35.3 (L) 36.0 - 46.0 % Final   Hematocrit  Date Value Ref Range Status  11/26/2019 29.9 (L) 34.0 - 46.6 % Final         Failed - HGB in normal range and within 180 days    Hemoglobin  Date Value Ref Range Status  03/23/2020 11.7 (L) 12.0 - 15.0 g/dL Final  11/26/2019 9.0 (L) 11.1 - 15.9 g/dL Final         Passed - PLT in normal range and within 180 days    Platelets  Date Value Ref Range Status  03/23/2020 316.0 150.0 - 400.0 K/uL Final  11/26/2019 403 150 - 450 x10E3/uL Final         Passed - WBC in normal range and within 180 days    WBC  Date Value Ref Range Status  03/23/2020 5.3 4.0 - 10.5 K/uL Final         Passed - Valid encounter within last 6 months    Recent Outpatient Visits          1 week ago Essential hypertension   [place], Deborah B, MD   4 months ago Essential hypertension   [place], Deborah B, MD   5 months ago Essential hypertension   [place], Jarome Matin, RPH-CPP   5 months ago Palpitations   [place], [place], NP   6 months ago Essential hypertension   [place], MD      Future Appointments            In 1 week Ladell Pier, MD [place]

## 2020-05-10 ENCOUNTER — Ambulatory Visit: Payer: Medicaid Other | Admitting: Internal Medicine

## 2020-05-20 ENCOUNTER — Telehealth: Payer: Self-pay | Admitting: Internal Medicine

## 2020-05-20 ENCOUNTER — Encounter: Payer: Self-pay | Admitting: Internal Medicine

## 2020-05-20 ENCOUNTER — Telehealth: Payer: Self-pay

## 2020-05-20 ENCOUNTER — Ambulatory Visit: Payer: Medicaid Other | Attending: Internal Medicine | Admitting: Internal Medicine

## 2020-05-20 ENCOUNTER — Other Ambulatory Visit: Payer: Self-pay

## 2020-05-20 VITALS — BP 164/93 | HR 63 | Resp 18 | Ht 64.0 in | Wt 132.5 lb

## 2020-05-20 DIAGNOSIS — D509 Iron deficiency anemia, unspecified: Secondary | ICD-10-CM | POA: Diagnosis not present

## 2020-05-20 DIAGNOSIS — I739 Peripheral vascular disease, unspecified: Secondary | ICD-10-CM

## 2020-05-20 DIAGNOSIS — F411 Generalized anxiety disorder: Secondary | ICD-10-CM

## 2020-05-20 DIAGNOSIS — L659 Nonscarring hair loss, unspecified: Secondary | ICD-10-CM

## 2020-05-20 DIAGNOSIS — F17200 Nicotine dependence, unspecified, uncomplicated: Secondary | ICD-10-CM

## 2020-05-20 DIAGNOSIS — Z122 Encounter for screening for malignant neoplasm of respiratory organs: Secondary | ICD-10-CM | POA: Diagnosis not present

## 2020-05-20 DIAGNOSIS — I10 Essential (primary) hypertension: Secondary | ICD-10-CM | POA: Diagnosis not present

## 2020-05-20 DIAGNOSIS — R634 Abnormal weight loss: Secondary | ICD-10-CM | POA: Diagnosis not present

## 2020-05-20 DIAGNOSIS — E876 Hypokalemia: Secondary | ICD-10-CM | POA: Diagnosis not present

## 2020-05-20 MED ORDER — POTASSIUM CHLORIDE ER 8 MEQ PO TBCR: 16.0000 meq | EXTENDED_RELEASE_TABLET | Freq: Every day | ORAL | 3 refills | Status: DC

## 2020-05-20 MED ORDER — LISINOPRIL 5 MG PO TABS: 5.0000 mg | ORAL_TABLET | Freq: Every day | ORAL | 3 refills | Status: DC

## 2020-05-20 MED ORDER — CILOSTAZOL 100 MG PO TABS: 100.0000 mg | ORAL_TABLET | Freq: Two times a day (BID) | ORAL | 3 refills | Status: DC

## 2020-05-20 MED ORDER — SERTRALINE HCL 50 MG PO TABS: 25.0000 mg | ORAL_TABLET | Freq: Every day | ORAL | 3 refills | Status: DC

## 2020-05-20 NOTE — Telephone Encounter (Signed)
Threasa Beards, from the pre service center, calling stating that the pt is needing to have her insurance precerted  before her upcoming procedure on 05/26/20. Please advise.      [place]

## 2020-05-20 NOTE — Patient Instructions (Signed)
Your blood pressure has improved but still not at goal. We will add another medication as discussed today called lisinopril.  Prescription has been sent to your pharmacy.  Take once a day.  As we discussed today, this medication sometimes can cause swelling of the lips tongue or throat.  If this happens, stop the medication and be seen in the emergency room immediately.  The medication sometimes can also cause dry cough.  Please let me know if this happens.  Continue to eat smaller more frequent meals.

## 2020-05-20 NOTE — Telephone Encounter (Signed)
Prior Brooke Chapman has been started. Prior Auth form and office note has been faxed

## 2020-05-20 NOTE — Progress Notes (Signed)
Patient ID: Brooke Chapman, female    DOB: [birth year]  MRN: [id number]  CC: Hypertension   Subjective: Brooke Chapman is a 60 y.o. female who presents for 3 wks f/u Her concerns today include:  Patient with history of PAD left leg, HTN, tobacco dependence, HL, urge incont, IDA  HTN: BP elevated on last visit.  She was still on carvedilol 12.5 mg twice a day instead of the 25 mg.  Since then she has been taking the 25 mg twice a day along with amlodipine and hydrochlorothiazide.   -Checking blood pressure daily.  She brings her recent readings with her:  174/112, 166/108, 165/107, 191/117, 164/97.  Pulse range 64-75 -Last visit, potassium level was low.  I sent a message to her through [place] her of this and the need to take potassium supplement while on hydrochlorothiazide.  She received a message but states she has not picked up the potassium as yet.  Denies any further palpitations.  Reports headaches are much less since increased dose of carvedilol.  Alopecia: My CMA requested records from [facility] dermatology on her biopsy but we have not received those records as yet. -ANA and antidouble-stranded DNA were negative  Unintentional weight loss: She has not had any further weight loss since last visit.  Eating smaller more frequent meals. -We will plan to recheck CBC on last visit.  She has history of iron deficiency anemia.  Reports taking iron supplement consistently.  Tobacco: Continues to smoke.  She did get the pills that she states she used in the past to help her quit cold smoke away but has not started using them as yet.  She has smoked since the age of 52 smoking at least a pack a day the majority of those years.  Now down to half a pack a day.  Anxiety: Started on Zoloft on last visit.  She feels that it helps a little.  Denies any untoward side effects.  Declines increased dose but requests refill on the prescription.  Needs RF on Pletal   Patient  Active Problem List   Diagnosis Date Noted  . GAD (generalized anxiety disorder) 05/01/2020  . Black stools 01/13/2020  . Unintended weight loss 01/13/2020  . Antiplatelet or antithrombotic long-term use 01/13/2020  . Hypertension   . Frontal balding 12/28/2019  . Iron deficiency anemia 12/28/2019  . Essential hypertension   . 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 PVD (peripheral vascular disease) ([place]) 08/26/2018  . Hyperlipidemia 05/02/2018  . HTN (hypertension) 05/01/2018  . Tobacco dependence 05/01/2018  . PAD (peripheral artery disease) ([place])      Current Outpatient Medications on File Prior to Visit  Medication Sig Dispense Refill  . amLODipine (NORVASC) 10 MG tablet Take 1 tablet (10 mg total) by mouth daily. Must have office visit for refills 30 tablet 6  . aspirin 81 MG chewable tablet Chew 81 mg by mouth daily.    Marland Kitchen atorvastatin (LIPITOR) 20 MG tablet Take 1.5 tablets (30 mg total) by mouth daily. 135 tablet 3  . Blood Pressure Monitor DEVI Use as directed to check home blood pressure 2-3 times a week 1 Device 0  . carvedilol (COREG) 25 MG tablet Take 1 tablet (25 mg total) by mouth 2 (two) times daily. 180 tablet 3  . cholecalciferol (VITAMIN D3) 25 MCG (1000 UT) tablet Take 1,000 Units by mouth daily.    . ferrous sulfate 325 (65 FE) MG tablet Take 1 tablet (325 mg total) by mouth  2 (two) times daily with a meal. 100 tablet 1  . hydrochlorothiazide (HYDRODIURIL) 12.5 MG tablet Take 1 tablet (12.5 mg total) by mouth daily. 90 tablet 3  . omeprazole (PRILOSEC) 40 MG capsule Take 1 capsule (40 mg total) by mouth daily. 30 capsule 3   No current facility-administered medications on file prior to visit.    Allergies  Allergen Reactions  . Latex Rash    Social History   Socioeconomic History  . Marital status: Single    Spouse name: Not on file  . Number of children: 0  . Years of education: Not on file  . Highest education level: Not on file  Occupational History  .  Occupation: Prep cook  Tobacco Use  . Smoking status: Current Every Day Smoker    Packs/day: 0.50    Types: Cigarettes  . Smokeless tobacco: Never Used  . Tobacco comment: 6-10 cigs daily  Vaping Use  . Vaping Use: Never used  Substance and Sexual Activity  . Alcohol use: Yes    Comment: 1 per day  . Drug use: Never  . Sexual activity: Not Currently  Other Topics Concern  . Not on file  Social History Narrative  . Not on file   Social Determinants of Health   Financial Resource Strain: Not on file  Food Insecurity: Not on file  Transportation Needs: Not on file  Physical Activity: Not on file  Stress: Not on file  Social Connections: Not on file  Intimate Partner Violence: Not on file    Family History  Problem Relation Age of Onset  . Heart disease Mother   . Hypertension Mother   . Hyperlipidemia Mother   . Hypertension Brother   . Heart disease Maternal Aunt   . Hypertension Maternal Aunt   . Hyperlipidemia Maternal Aunt   . Colon cancer Neg Hx   . Liver cancer Neg Hx   . Rectal cancer Neg Hx   . Stomach cancer Neg Hx     Past Surgical History:  Procedure Laterality Date  . ABDOMINAL AORTOGRAM W/LOWER EXTREMITY Bilateral 07/13/2019   Procedure: ABDOMINAL AORTOGRAM W/LOWER EXTREMITY;  Surgeon: Waynetta Sandy, MD;  Location: [place] [facility];  Service: Cardiovascular;  Laterality: Bilateral;  . BREAST EXCISIONAL BIOPSY Left    LESS THAN 5 YEARS AGO  . PERIPHERAL VASCULAR INTERVENTION Left 07/13/2019   Procedure: PERIPHERAL VASCULAR INTERVENTION;  Surgeon: Waynetta Sandy, MD;  Location: [place] [facility];  Service: Cardiovascular;  Laterality: Left;  common iliac    ROS: Review of Systems Negative except as stated above  PHYSICAL EXAM: BP (!) 164/93   Pulse 63   Resp 18   Ht 5\' 4"  (1.626 m)   Wt 132 lb 8 oz (60.1 kg)   SpO2 98%   BMI 22.74 kg/m   Wt Readings from Last 3 Encounters:  05/20/20 132 lb 8 oz (60.1 kg)  04/29/20  131 lb 3.2 oz (59.5 kg)  02/25/20 153 lb 4 oz (69.5 kg)    Physical Exam  General appearance - alert, well appearing, and in no distress Mental status - normal mood, behavior, speech, dress, motor activity, and thought processes Neck - supple, no significant adenopathy Chest - clear to auscultation, no wheezes, rales or rhonchi, symmetric air entry Heart - normal rate, regular rhythm, normal S1, S2, no murmurs, rubs, clicks or gallops Extremities - peripheral pulses normal, no pedal edema, no clubbing or cyanosis   CMP Latest Ref Rng & Units 04/29/2020  07/13/2019 05/11/2019  Glucose 65 - 99 mg/dL 96 88 90  BUN 8 - 27 mg/dL 15 8 13   Creatinine 0.57 - 1.00 mg/dL 1.20(H) 0.90 0.91  Sodium 134 - 144 mmol/L 134 142 142  Potassium 3.5 - 5.2 mmol/L 3.1(L) 3.8 4.1  Chloride 96 - 106 mmol/L 89(L) 100 101  CO2 20 - 29 mmol/L 29 - 23  Calcium 8.7 - 10.3 mg/dL 9.2 - 9.2  Total Protein 6.0 - 8.5 g/dL 7.3 - 7.5  Total Bilirubin 0.0 - 1.2 mg/dL <0.2 - <0.2  Alkaline Phos 44 - 121 IU/L 82 - 111  AST 0 - 40 IU/L 29 - 21  ALT 0 - 32 IU/L 14 - 9   Lipid Panel     Component Value Date/Time   CHOL 184 05/11/2019 1534   TRIG 142 05/11/2019 1534   HDL 64 05/11/2019 1534   CHOLHDL 2.9 05/11/2019 1534   LDLCALC 95 05/11/2019 1534    CBC    Component Value Date/Time   WBC 5.3 03/23/2020 0813   RBC 3.85 (L) 03/23/2020 0813   HGB 11.7 (L) 03/23/2020 0813   HGB 9.0 (L) 11/26/2019 1226   HCT 35.3 (L) 03/23/2020 0813   HCT 29.9 (L) 11/26/2019 1226   PLT 316.0 03/23/2020 0813   PLT 403 11/26/2019 1226   MCV 91.5 03/23/2020 0813   MCV 78 (L) 11/26/2019 1226   MCH 23.4 (L) 11/26/2019 1226   MCH 31.1 04/10/2018 1203   MCHC 33.3 03/23/2020 0813   RDW 17.0 (H) 03/23/2020 0813   RDW 17.2 (H) 11/26/2019 1226   LYMPHSABS 3.6 (H) 05/01/2018 1556   EOSABS 0.0 05/01/2018 1556   BASOSABS 0.0 05/01/2018 1556   Lab Results  Component Value Date   TSH 1.160 04/29/2020   ASSESSMENT AND PLAN: 1.  Hypertension, unspecified type Improved compared to last visit but not at goal.  Continue amlodipine, hydrochlorothiazide and carvedilol.  Add low-dose lisinopril.  Went over possible side effects of lisinopril including angioedema which can present as swelling of the lips tongue or throat.  Advised that if this occurs she should stop the medicine and be seen in the emergency room immediately.  Also advised that it can sometimes cause dry cough.  If this occurs she should let me know. -Continue to monitor blood pressure at home. Follow-up with clinical pharmacist in 2 weeks for repeat blood pressure check -Advised to fill the prescription for the potassium supplement and start taking. -Given the low potassium we will check/screen for hyper [place].  Given her difficult to control blood pressure and symptoms of headaches, palpitations, will also screen for pheochromocytosis   - lisinopril (ZESTRIL) 5 MG tablet; Take 1 tablet (5 mg total) by mouth daily.  Dispense: 30 tablet; Refill: 3 - Metanephrines, plasma - Aldosterone + renin activity w/ ratio  2. PAD (peripheral artery disease) (HCC) - cilostazol (PLETAL) 100 MG tablet; Take 1 tablet (100 mg total) by mouth 2 (two) times daily.  Dispense: 180 tablet; Refill: 3  3. Tobacco dependence Advised to quit.  She states she is wanting to but has not fully committed to doing so as yet.  Strongly encourage her to quit smoking given health risks associated with it.  Given that she is more than 30-pack-year smoker  I recommend lung cancer screening.  Patient is agreeable to having low-dose CT of the chest. - CT CHEST LUNG CA SCREEN LOW DOSE W/O CM; Future  4. Unintended weight loss Stable since last visit.  Besides  screening for lung cancer, I recommend CT of the abdomen and pelvis but patient wants to hold off on that for now.  5. Screening for lung cancer See #3 above - CT CHEST LUNG CA SCREEN LOW DOSE W/O CM; Future  6. GAD (generalized anxiety  disorder) Continue low-dose Zoloft - sertraline (ZOLOFT) 50 MG tablet; Take 0.5 tablets (25 mg total) by mouth daily.  Dispense: 30 tablet; Refill: 3  7. Alopecia Message sent to my CMA to contact [facility] again to get copy of the dermatology note and biopsy report  8. Hypokalemia - Potassium - Aldosterone + renin activity w/ ratio - potassium chloride (KLOR-CON) 8 MEQ tablet; Take 2 tablets (16 mEq total) by mouth daily.  Dispense: 60 tablet; Refill: 3  9. Iron deficiency anemia, unspecified iron deficiency anemia type - CBC    Patient was given the opportunity to ask questions.  Patient verbalized understanding of the plan and was able to repeat key elements of the plan.   Orders Placed This Encounter  Procedures  . CT CHEST LUNG CA SCREEN LOW DOSE W/O CM  . Metanephrines, plasma  . Potassium  . CBC  . Aldosterone + renin activity w/ ratio     Requested Prescriptions   Signed Prescriptions Disp Refills  . cilostazol (PLETAL) 100 MG tablet 180 tablet 3    Sig: Take 1 tablet (100 mg total) by mouth 2 (two) times daily.  . sertraline (ZOLOFT) 50 MG tablet 30 tablet 3    Sig: Take 0.5 tablets (25 mg total) by mouth daily.  Marland Kitchen lisinopril (ZESTRIL) 5 MG tablet 30 tablet 3    Sig: Take 1 tablet (5 mg total) by mouth daily.  . potassium chloride (KLOR-CON) 8 MEQ tablet 60 tablet 3    Sig: Take 2 tablets (16 mEq total) by mouth daily.    Return in about 4 months (around 09/19/2020) for Give appt with [facility] in 2 wks for BP recheck.  Karle Plumber, MD, FACP

## 2020-05-20 NOTE — Telephone Encounter (Signed)
Prior Brooke Chapman has already been started. Started prior [place] today

## 2020-05-23 ENCOUNTER — Telehealth: Payer: Self-pay

## 2020-05-23 ENCOUNTER — Other Ambulatory Visit: Payer: Self-pay | Admitting: Internal Medicine

## 2020-05-23 DIAGNOSIS — I739 Peripheral vascular disease, unspecified: Secondary | ICD-10-CM

## 2020-05-23 NOTE — Telephone Encounter (Signed)
PA for Cilostazol approved until 05/21/21

## 2020-05-26 ENCOUNTER — Ambulatory Visit (HOSPITAL_COMMUNITY): Payer: Medicaid Other

## 2020-05-30 ENCOUNTER — Ambulatory Visit (HOSPITAL_COMMUNITY): Payer: Medicaid Other

## 2020-05-31 LAB — ALDOSTERONE + RENIN ACTIVITY W/ RATIO
ALDOS/RENIN RATIO: 1.1 (ref 0.0–30.0)
ALDOSTERONE: 20.8 ng/dL (ref 0.0–30.0)
Renin: 19.108 ng/mL/hr — ABNORMAL HIGH (ref 0.167–5.380)

## 2020-05-31 LAB — CBC
Hematocrit: 38.1 % (ref 34.0–46.6)
Hemoglobin: 12.6 g/dL (ref 11.1–15.9)
MCH: 29.6 pg (ref 26.6–33.0)
MCHC: 33.1 g/dL (ref 31.5–35.7)
MCV: 90 fL (ref 79–97)
Platelets: 300 10*3/uL (ref 150–450)
RBC: 4.25 x10E6/uL (ref 3.77–5.28)
RDW: 14.9 % (ref 11.7–15.4)
WBC: 5.5 10*3/uL (ref 3.4–10.8)

## 2020-05-31 LAB — METANEPHRINES, PLASMA
Metanephrine, Free: 11.3 pg/mL (ref 0.0–88.0)
Normetanephrine, Free: 47 pg/mL (ref 0.0–285.2)

## 2020-05-31 LAB — POTASSIUM: Potassium: 3.3 mmol/L — ABNORMAL LOW (ref 3.5–5.2)

## 2020-05-31 NOTE — Progress Notes (Signed)
I sent patient's lab results to her MyChart but I see that she has not looked at them as yet.  Please call her and let her know that her potassium level is low.  She needs to fill the prescription for the potassium supplement that I sent to her pharmacy on her most recent visit.  She is no longer anemic.  Instead of taking the iron supplement called ferrous sulfate every day, she can decrease to taking it 3 times a week on Monday Wednesday and Fridays.

## 2020-06-06 ENCOUNTER — Ambulatory Visit (HOSPITAL_COMMUNITY): Payer: Medicaid Other

## 2020-06-09 ENCOUNTER — Other Ambulatory Visit: Payer: Self-pay

## 2020-06-09 ENCOUNTER — Ambulatory Visit (HOSPITAL_COMMUNITY)
Admission: RE | Admit: 2020-06-09 | Discharge: 2020-06-09 | Disposition: A | Discharge status: Home / Self Care | Payer: Medicaid Other | Source: Ambulatory Visit | Attending: Internal Medicine | Admitting: Internal Medicine

## 2020-06-09 DIAGNOSIS — J439 Emphysema, unspecified: Secondary | ICD-10-CM | POA: Diagnosis not present

## 2020-06-09 DIAGNOSIS — F17200 Nicotine dependence, unspecified, uncomplicated: Secondary | ICD-10-CM

## 2020-06-09 DIAGNOSIS — I2510 Atherosclerotic heart disease of native coronary artery without angina pectoris: Secondary | ICD-10-CM | POA: Diagnosis not present

## 2020-06-09 DIAGNOSIS — Z122 Encounter for screening for malignant neoplasm of respiratory organs: Secondary | ICD-10-CM | POA: Insufficient documentation

## 2020-06-09 DIAGNOSIS — I700 Atherosclerosis of aorta: Secondary | ICD-10-CM | POA: Diagnosis not present

## 2020-06-09 DIAGNOSIS — F17210 Nicotine dependence, cigarettes, uncomplicated: Secondary | ICD-10-CM | POA: Diagnosis not present

## 2020-06-11 ENCOUNTER — Telehealth: Payer: Self-pay | Admitting: Internal Medicine

## 2020-06-12 ENCOUNTER — Telehealth: Payer: Self-pay | Admitting: Internal Medicine

## 2020-06-12 DIAGNOSIS — J948 Other specified pleural conditions: Secondary | ICD-10-CM

## 2020-06-12 DIAGNOSIS — I2510 Atherosclerotic heart disease of native coronary artery without angina pectoris: Secondary | ICD-10-CM

## 2020-06-12 DIAGNOSIS — I2584 Coronary atherosclerosis due to calcified coronary lesion: Secondary | ICD-10-CM

## 2020-06-12 NOTE — Telephone Encounter (Signed)
Phone call placed to patient this afternoon to discuss the results of the CAT scan of her chest.  I left a message on her voicemail stating that I am Dr. Wynetta Emery and I am calling to review the results of the imaging study that she had done.  I will try to call her again tomorrow. I wanted her to know that I will also send an explanation of the results to her MyChart account.  CT-scan of the chest FINDINGS: Cardiovascular: Aortic atherosclerosis. Normal heart size, without pericardial effusion. Three vessel coronary artery calcification.  Mediastinum/Nodes: No mediastinal or definite hilar adenopathy, given limitations of unenhanced CT.  Superior left posterior chest, pleural-based, crescentric soft tissue density lesion measures 2.7 x 1.7 cm on 14/2 and coronal image 167.  Lungs/Pleura: No pleural fluid. Mild centrilobular emphysema. Isolated right lower lobe pulmonary nodule of volume derived equivalent diameter 4.0 mm.  Upper Abdomen: Normal imaged portions of the liver, spleen. Suspect surgical changes about the posterior stomach. Paucity of abdominal retroperitoneal fat degrades evaluation. Abdominal aortic atherosclerosis.  Musculoskeletal: No acute osseous abnormality.  IMPRESSION: 1. Lung-RADS 2S, benign appearance or behavior. Continue annual screening with low-dose chest CT without contrast in 12 months. 2. The "S" modifier represents a potentially clinically significant non pulmonary finding. Posterior left chest soft tissue density lesion with differential considerations of fibrous tumor of the pleura, extramedullary hematopoiesis, and posterior mediastinal/neurogenic tumor. Consider multidisciplinary thoracic oncology consultation with potential strategies of tissue sampling, PET, or three-month follow-up chest CT. 3. Age advanced coronary artery atherosclerosis. Recommend assessment of coronary risk factors and consideration of medical therapy. 4. Aortic  atherosclerosis (ICD10-I70.0) and emphysema (ICD10-J43.9).

## 2020-06-13 ENCOUNTER — Telehealth: Payer: Self-pay | Admitting: Internal Medicine

## 2020-06-13 NOTE — Telephone Encounter (Signed)
Phone call placed to patient again this afternoon to discuss results of the CAT scan of her chest.  I left a message informing her of who I am and that I was calling to discuss results.  I request that she give a call back to the office and let us know what is the best time to reach her.

## 2020-06-14 ENCOUNTER — Telehealth: Payer: Self-pay | Admitting: Internal Medicine

## 2020-06-14 NOTE — Telephone Encounter (Signed)
Phone call placed to patient this morning.  Patient answered.  I confirmed with name and date of birth.  Patient reports that she did listen to my messages but was unsure what everything meant. I went over with her the results of the CAT scan of the chest that was done as screening for lung cancer.  Patient informed that she has what looks like a mass in the left lower part of the chest that is outside of the lung tissue itself.  This is concerning and I recommend referral to a thoracic surgeon to evaluate this further.  This may need to be biopsied. She was also informed that there was a small nodule in the right lower lobe for which we will need to repeat the CAT scan in about 1 year to make sure that it remains stable.  Emphysematous changes were seen in the lungs.  Strongly advised smoking cessation.  All of patient's questions were answered.  She is agreeable to referral to the cardiothoracic surgeon.  Inform patient that she will be called with the appointment.  She expressed understanding and thanked me for calling.

## 2020-06-18 ENCOUNTER — Telehealth: Payer: Self-pay | Admitting: Internal Medicine

## 2020-06-18 NOTE — Telephone Encounter (Signed)
-----   Message from Ned Clines sent at 06/17/2020  2:38 PM EDT ----- [facility] received this referral, however we need for your office to order/schedule "MR of the thoracic spine " to make sure it doesn't have a intervertebral component. When this is schedule our office will schedule appointment with the surgeon. Thanks  Kinder [facility] [phone]

## 2020-06-18 NOTE — Telephone Encounter (Signed)
Phone call placed to patient this morning.  Patient informed that I did hear back from the CT surgeon's office yesterday.  They are requesting that we do an MRI of the thoracic spine first to make sure the lesion seen on the left side of the chest does not have an intervertebral component.  Once this is scheduled, they will then schedule the patient with the surgeon.  Patient agreeable to having this done if it is necessary.  I will touch base with the CT surgeon and perhaps radiologist on this next week before ordering and get back to her. Pt expressed understanding and thank me for calling.

## 2020-06-22 ENCOUNTER — Telehealth: Payer: Self-pay | Admitting: Internal Medicine

## 2020-06-22 DIAGNOSIS — R222 Localized swelling, mass and lump, trunk: Secondary | ICD-10-CM

## 2020-06-24 NOTE — Telephone Encounter (Signed)
MRI is schedule for June 6 at 12pm at [facility]. Pt will need to arrive at 1130pm.   Contacted pt to go over appointment info pt didn't answer lvm

## 2020-06-30 ENCOUNTER — Telehealth: Payer: Self-pay | Admitting: Internal Medicine

## 2020-06-30 DIAGNOSIS — M79671 Pain in right foot: Secondary | ICD-10-CM

## 2020-06-30 NOTE — Telephone Encounter (Unsigned)
Copied from [place] [id number]. Topic: General - Other >> Jun 29, 2020 10:15 AM Tessa Lerner A wrote: Reason for CRM: Melanie with Pre Authorization for imaging would like to be contacted regarding pre certification an upcoming MRI that's been scheduled for the patient   Additional coordination between the patient's PCP and insurnace company is needed  Please contact to further advise

## 2020-06-30 NOTE — Telephone Encounter (Signed)
Copied from [place] [id number]. Topic: Referral - Request for Referral >> Jun 28, 2020 10:03 AM Tessa Lerner A wrote: Has patient seen PCP for this complaint? Yes  *If NO, is insurance requiring patient see PCP for this issue before PCP can refer them?  Referral for which specialty: Podiatry   Preferred provider/office: Dr. Gardiner Barefoot  Reason for referral: right foot discomfort

## 2020-07-01 NOTE — Telephone Encounter (Signed)
Will forward to provider to place referral  

## 2020-07-02 NOTE — Telephone Encounter (Signed)
Referral submitted to podiatry per patient's request. Phone call placed to patient today.  I got her voicemail.  I left her a message reminding her of her appointment at [facility] at 11:30 AM on Monday, June 6 to get the MRI of her chest.

## 2020-07-04 ENCOUNTER — Ambulatory Visit (HOSPITAL_COMMUNITY): Payer: Medicaid Other

## 2020-07-04 ENCOUNTER — Encounter (HOSPITAL_COMMUNITY): Payer: Self-pay

## 2020-07-04 NOTE — Telephone Encounter (Signed)
Started prior auth for MRI. Faxed prior auth form and notes to [employer]

## 2020-07-08 ENCOUNTER — Other Ambulatory Visit: Payer: Self-pay

## 2020-07-12 ENCOUNTER — Institutional Professional Consult (permissible substitution) (INDEPENDENT_AMBULATORY_CARE_PROVIDER_SITE_OTHER): Payer: Medicaid Other | Admitting: Thoracic Surgery (Cardiothoracic Vascular Surgery)

## 2020-07-12 ENCOUNTER — Other Ambulatory Visit: Payer: Self-pay

## 2020-07-12 ENCOUNTER — Encounter: Payer: Self-pay | Admitting: Thoracic Surgery (Cardiothoracic Vascular Surgery)

## 2020-07-12 VITALS — BP 140/100 | HR 64 | Resp 20 | Ht 64.0 in | Wt 140.0 lb

## 2020-07-12 DIAGNOSIS — J948 Other specified pleural conditions: Secondary | ICD-10-CM

## 2020-07-12 DIAGNOSIS — R9389 Abnormal findings on diagnostic imaging of other specified body structures: Secondary | ICD-10-CM | POA: Diagnosis not present

## 2020-07-12 NOTE — Progress Notes (Signed)
PCP is Ladell Pier, MD Referring Provider is Ladell Pier, MD  Chief Complaint  Patient presents with   pleural mass    Chest CT 06/09/20    HPI: Brooke Chapman is sent for consultation regarding a posterior mediastinal mass found on a low-dose screening CT of the chest.  Brooke Chapman is a 60 year old woman with a history of hypertension, hyperlipidemia, PAD, anemia, and tobacco abuse.  She has smoked on average about a pack a day since age 35 (68 pack years).  She now varies between half a pack a day and a pack a day.  She recently had a low-dose screening CT of the chest.  She did not have any suspicious lung nodules.  She did have some aortic and coronary atherosclerosis.  She also had a 2.7 x 1.7 cm posterior superior mediastinal mass.  She denied having any chest pain, pressure, or tightness.  She does get short of breath with exertion.  She does have claudication.   Past Medical History:  Diagnosis Date   Anemia    Essential hypertension    HTN (hypertension) 05/01/2018   Hyperlipidemia    Hypertension    PAD (peripheral artery disease) (H[facility])    PVD (peripheral vascular disease) ([place]) 08/26/2018    Past Surgical History:  Procedure Laterality Date   ABDOMINAL AORTOGRAM W/LOWER EXTREMITY Bilateral 07/13/2019   Procedure: ABDOMINAL AORTOGRAM W/LOWER EXTREMITY;  Surgeon: Waynetta Sandy, MD;  Location: [facility];  Service: Cardiovascular;  Laterality: Bilateral;   BREAST EXCISIONAL BIOPSY Left    LESS THAN 5 YEARS AGO   PERIPHERAL VASCULAR INTERVENTION Left 07/13/2019   Procedure: PERIPHERAL VASCULAR INTERVENTION;  Surgeon: Waynetta Sandy, MD;  Location: [facility];  Service: Cardiovascular;  Laterality: Left;  common iliac    Family History  Problem Relation Age of Onset   Heart disease Mother    Hypertension Mother    Hyperlipidemia Mother    Hypertension Brother    Heart disease Maternal Aunt    Hypertension  Maternal Aunt    Hyperlipidemia Maternal Aunt    Colon cancer Neg Hx    Liver cancer Neg Hx    Rectal cancer Neg Hx    Stomach cancer Neg Hx     Social History Social History   Tobacco Use   Smoking status: Every Day    Packs/day: 0.50    Pack years: 0.00    Types: Cigarettes   Smokeless tobacco: Never   Tobacco comments:    6-10 cigs daily  Vaping Use   Vaping Use: Never used  Substance Use Topics   Alcohol use: Yes    Comment: 1 per day   Drug use: Never    Current Outpatient Medications  Medication Sig Dispense Refill   amLODipine (NORVASC) 10 MG tablet Take 1 tablet (10 mg total) by mouth daily. Must have office visit for refills 30 tablet 6   aspirin 81 MG chewable tablet Chew 81 mg by mouth daily.     atorvastatin (LIPITOR) 20 MG tablet Take 1.5 tablets (30 mg total) by mouth daily. 135 tablet 3   Blood Pressure Monitor DEVI Use as directed to check home blood pressure 2-3 times a week 1 Device 0   cholecalciferol (VITAMIN D3) 25 MCG (1000 UT) tablet Take 1,000 Units by mouth daily.     cilostazol (PLETAL) 100 MG tablet Take 1 tablet (100 mg total) by mouth 2 (two) times daily. 180 tablet 3   ferrous  sulfate 325 (65 FE) MG tablet Take 1 tablet (325 mg total) by mouth 2 (two) times daily with a meal. 100 tablet 1   hydrochlorothiazide (HYDRODIURIL) 12.5 MG tablet Take 1 tablet (12.5 mg total) by mouth daily. 90 tablet 3   lisinopril (ZESTRIL) 5 MG tablet Take 1 tablet (5 mg total) by mouth daily. 30 tablet 3   omeprazole (PRILOSEC) 40 MG capsule Take 1 capsule (40 mg total) by mouth daily. 30 capsule 3   potassium chloride (KLOR-CON) 8 MEQ tablet Take 2 tablets (16 mEq total) by mouth daily. 60 tablet 3   sertraline (ZOLOFT) 50 MG tablet Take 0.5 tablets (25 mg total) by mouth daily. 30 tablet 3   carvedilol (COREG) 25 MG tablet Take 1 tablet (25 mg total) by mouth 2 (two) times daily. 180 tablet 3   No current facility-administered medications for this visit.     Allergies  Allergen Reactions   Latex Rash    Review of Systems  Constitutional:  Positive for unexpected weight change (Weight loss). Negative for activity change and appetite change.  Eyes:  Positive for visual disturbance (Blurry).  Respiratory:  Positive for shortness of breath (Variable).   Cardiovascular:  Negative for chest pain.       Claudication  Gastrointestinal:  Positive for abdominal pain and constipation.  Genitourinary:  Negative for difficulty urinating and dysuria.  Musculoskeletal:  Negative for back pain.  Neurological:  Negative for dizziness and syncope.  Hematological:  Negative for adenopathy. Does not bruise/bleed easily.  All other systems reviewed and are negative.  BP (!) 140/100   Pulse 64   Resp 20   Ht 5\' 4"  (1.626 m)   Wt 140 lb (63.5 kg)   SpO2 98% Comment: RA  BMI 24.03 kg/m  Physical Exam Constitutional:      General: She is not in acute distress.    Appearance: Normal appearance.  HENT:     Head: Normocephalic and atraumatic.  Eyes:     General: No scleral icterus.    Extraocular Movements: Extraocular movements intact.  Neck:     Vascular: No carotid bruit.  Cardiovascular:     Rate and Rhythm: Normal rate and regular rhythm.     Heart sounds: Murmur (2/6 systolic) heard.  Pulmonary:     Effort: Pulmonary effort is normal. No respiratory distress.     Breath sounds: Normal breath sounds. No wheezing or rales.  Abdominal:     General: There is no distension.     Palpations: Abdomen is soft.     Tenderness: There is no abdominal tenderness.  Skin:    General: Skin is warm and dry.  Neurological:     General: No focal deficit present.     Mental Status: She is alert and oriented to person, place, and time.     Cranial Nerves: No cranial nerve deficit.     Motor: No weakness.     Diagnostic Tests: CT CHEST WITHOUT CONTRAST LOW-DOSE FOR LUNG CANCER SCREENING   TECHNIQUE: Multidetector CT imaging of the chest was  performed following the standard protocol without IV contrast.   COMPARISON:  None.   FINDINGS: Cardiovascular: Aortic atherosclerosis. Normal heart size, without pericardial effusion. Three vessel coronary artery calcification.   Mediastinum/Nodes: No mediastinal or definite hilar adenopathy, given limitations of unenhanced CT.   Superior left posterior chest, pleural-based, crescentric soft tissue density lesion measures 2.7 x 1.7 cm on 14/2 and coronal image 167.   Lungs/Pleura: No pleural fluid. Mild  centrilobular emphysema. Isolated right lower lobe pulmonary nodule of volume derived equivalent diameter 4.0 mm.   Upper Abdomen: Normal imaged portions of the liver, spleen. Suspect surgical changes about the posterior stomach. Paucity of abdominal retroperitoneal fat degrades evaluation. Abdominal aortic atherosclerosis.   Musculoskeletal: No acute osseous abnormality.   IMPRESSION: 1. Lung-RADS 2S, benign appearance or behavior. Continue annual screening with low-dose chest CT without contrast in 12 months. 2. The "S" modifier represents a potentially clinically significant non pulmonary finding. Posterior left chest soft tissue density lesion with differential considerations of fibrous tumor of the pleura, extramedullary hematopoiesis, and posterior mediastinal/neurogenic tumor. Consider multidisciplinary thoracic oncology consultation with potential strategies of tissue sampling, PET, or three-month follow-up chest CT. 3. Age advanced coronary artery atherosclerosis. Recommend assessment of coronary risk factors and consideration of medical therapy. 4. Aortic atherosclerosis (ICD10-I70.0) and emphysema (ICD10-J43.9).   These results will be called to the ordering clinician or representative by the Radiologist Assistant, and communication documented in the PACS or [employer].     Electronically Signed   By: Abigail Miyamoto M.D.   On: 06/10/2020 16:32 I  personally reviewed the CT images.  There is a 2.7 x 1.7 cm smoothly marginated posterior mediastinal mass superiorly on the left.  Impression:  Brooke Chapman is a 61 year old woman with a history of hypertension, hyperlipidemia, PAD, anemia, and tobacco abuse.  She recently had a low-dose screening CT which showed a smoothly marginated posterior superior mediastinal mass.  We discussed the differential diagnosis of the mediastinal mass.  In this area the most common is neurogenic tumors.  The mass is most likely a benign schwannoma.  But I emphasized to her that we cannot rule out a more aggressive lesion based on a single CT image.  We discussed the options including surgical resection for definitive diagnosis and management, needle biopsy, and radiographic observation.  We discussed the relative advantages and disadvantages of each approach.  Obviously the less invasive approach would be radiographic observation.  She does understand that there is potential for progression if we choose that option.  After discussing the options in detail she very strongly favors radiographic observation and does not want any invasive procedures at this time.   Plan: Return in 2 months with CT chest (3 months from last scan) I spent over 30 minutes today in review of records, images, and in consultation with Ms. Senaida Ores, MD [facility] [phone]

## 2020-07-20 NOTE — Telephone Encounter (Signed)
Tried [facility] to get results had to lvm  It would probably be best if pt goes to [facility] and pick up a copy to bring to [place]

## 2020-07-22 ENCOUNTER — Other Ambulatory Visit: Payer: Self-pay

## 2020-07-22 ENCOUNTER — Ambulatory Visit: Payer: Medicaid Other | Admitting: Podiatry

## 2020-07-22 DIAGNOSIS — I739 Peripheral vascular disease, unspecified: Secondary | ICD-10-CM | POA: Diagnosis not present

## 2020-07-22 DIAGNOSIS — D689 Coagulation defect, unspecified: Secondary | ICD-10-CM | POA: Diagnosis not present

## 2020-07-22 DIAGNOSIS — Q828 Other specified congenital malformations of skin: Secondary | ICD-10-CM | POA: Diagnosis not present

## 2020-07-22 NOTE — Progress Notes (Signed)
This patient present to the office  with chief complaint of callus developing on her right foot..  She says this callus has become painful walking and wearing her shoes.  Patient says she has self treated her callus for years and presents to the office for treatment and to discuss additional treatment.  She says she uses a pumice stone frequently and has tried insoles.  Problem persists. Patient has provided no  sought professional help.  She presents to the office for treatment of her painful callus.  Patient is taking pletal.    Vascular  Dorsalis pedis and posterior tibial pulses are weakly/absent palpable  B/L.  Capillary return  WNL.  Cold feet..  Skin turgor  WNL  Sensorium  Senn Weinstein monofilament wire  Diminished.  . Normal tactile sensation.  Nail Exam  Patient has normal nails with no evidence of bacterial or fungal infection.  Orthopedic  Exam  Muscle tone and muscle strength  WNL.  No limitations of motion feet  B/L.  No crepitus or joint effusion noted.  Foot type is unremarkable and digits show no abnormalities.  Bony prominences are unremarkable.  Plantar flexed fifth metatarsal  B/L  .HAV  B/L.  Skin  No open lesions.  Normal skin texture and turgor.  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  Porokeratosis/Callus right foot.   ROV.    Discussed condition with patient.  Patient presents to the office saying her callus are extremely painful and does not want routine debridement but she desires a surgical consullt to consider surgical evaluation to see if surgery would help.  Need to check her circulation prior to surgical   consideration.  RTC prn.  Gardiner Barefoot DPM

## 2020-07-27 DIAGNOSIS — Y999 Unspecified external cause status: Secondary | ICD-10-CM | POA: Diagnosis not present

## 2020-07-27 DIAGNOSIS — Z23 Encounter for immunization: Secondary | ICD-10-CM | POA: Diagnosis not present

## 2020-07-27 DIAGNOSIS — W268XXA Contact with other sharp object(s), not elsewhere classified, initial encounter: Secondary | ICD-10-CM | POA: Diagnosis not present

## 2020-07-27 DIAGNOSIS — S61213A Laceration without foreign body of left middle finger without damage to nail, initial encounter: Secondary | ICD-10-CM | POA: Diagnosis not present

## 2020-07-27 DIAGNOSIS — S61211A Laceration without foreign body of left index finger without damage to nail, initial encounter: Secondary | ICD-10-CM | POA: Diagnosis not present

## 2020-07-28 ENCOUNTER — Other Ambulatory Visit: Payer: Self-pay | Admitting: *Deleted

## 2020-07-28 ENCOUNTER — Telehealth: Payer: Self-pay | Admitting: *Deleted

## 2020-07-28 DIAGNOSIS — J9859 Other diseases of mediastinum, not elsewhere classified: Secondary | ICD-10-CM

## 2020-07-28 NOTE — Telephone Encounter (Signed)
Transition Care Management Unsuccessful Follow-up Telephone Call  Date of discharge and from where:  07/27/2020 - [place]  Attempts:  1st Attempt  Reason for unsuccessful TCM follow-up call:  Left voice message

## 2020-07-29 NOTE — Telephone Encounter (Signed)
Transition Care Management Unsuccessful Follow-up Telephone Call  Date of discharge and from where:  07/27/2020 - [place]  Attempts:  2nd Attempt  Reason for unsuccessful TCM follow-up call:  No answer/busy

## 2020-08-02 ENCOUNTER — Other Ambulatory Visit: Payer: Self-pay

## 2020-08-02 ENCOUNTER — Ambulatory Visit (INDEPENDENT_AMBULATORY_CARE_PROVIDER_SITE_OTHER): Payer: Medicaid Other | Admitting: Podiatry

## 2020-08-02 DIAGNOSIS — Q828 Other specified congenital malformations of skin: Secondary | ICD-10-CM | POA: Diagnosis not present

## 2020-08-02 DIAGNOSIS — I739 Peripheral vascular disease, unspecified: Secondary | ICD-10-CM

## 2020-08-02 NOTE — Telephone Encounter (Signed)
Transition Care Management Unsuccessful Follow-up Telephone Call  Date of discharge and from where:  07/27/2020 - [facility]  Attempts:  3rd Attempt  Reason for unsuccessful TCM follow-up call:  No answer/busy

## 2020-08-02 NOTE — Patient Instructions (Signed)
Look for urea 40% with salicylic acid cream or ointment and apply to the thickened dry skin / calluses. This can be bought over the counter, at a pharmacy or online such as [employer].  

## 2020-08-04 ENCOUNTER — Telehealth: Payer: Self-pay | Admitting: Internal Medicine

## 2020-08-04 NOTE — Telephone Encounter (Signed)
Will forward to provider  

## 2020-08-04 NOTE — Telephone Encounter (Signed)
Copied from [place] [id number]. Topic: General - Other >> Jul 28, 2020  9:48 AM Antonieta Iba C wrote: Reason for CRM: pt called in to make provider aware that she still has a dry cough. Pt says that she was told by provider to make her aware if cough stayed consistent.    Pt would also like to make provider aware that she had a ED visit because she cut 2 of her fingers.

## 2020-08-05 ENCOUNTER — Encounter: Payer: Self-pay | Admitting: Podiatry

## 2020-08-05 NOTE — Progress Notes (Signed)
  Subjective:  Patient ID: [name], female    DOB: 1960/02/11,  MRN: [id number]  Chief Complaint  Patient presents with   Callouses      surgical consult per Dr Prudence Davidson    60 y.o. female presents with the above complaint. History confirmed with patient. She has multiple painful calluses on the right foot   Objective:  Physical Exam: warm, good capillary refill, no trophic changes or ulcerative lesions, weakly palpable DP and PT pulses, and normal sensory exam. Porokeratosis SM 5, sub hallux, plantar heel Assessment:   1. Porokeratosis   2. PVD (peripheral vascular disease) ([place])      Plan:  Patient was evaluated and treated and all questions answered.  Discussed treatment for such lesions with her. I do not see a surgical solution for these that I could offer. Discussed with her the risk of recurrence, painful scar, wound healing issues with direct excision. There is no osseous deformity that would be reasonably addressed for these. This is compounded by her smoking history and PAD. Recommend she have regular debridement with pedicures and use of urea cream and salicylic acid ointment.  Return if symptoms worsen or fail to improve.

## 2020-08-05 NOTE — Telephone Encounter (Signed)
Contacted pt to go over provider response pt didn't answer lvm  

## 2020-08-08 NOTE — Telephone Encounter (Signed)
Pt has called back in re to phone call, tried to set up an appt but pt was not happy with how far the appts  are out. Pt request another FU call from Dr's nurse at [phone]

## 2020-08-19 ENCOUNTER — Other Ambulatory Visit: Payer: Self-pay

## 2020-08-19 DIAGNOSIS — I739 Peripheral vascular disease, unspecified: Secondary | ICD-10-CM

## 2020-08-24 ENCOUNTER — Encounter: Payer: Self-pay | Admitting: Internal Medicine

## 2020-08-26 ENCOUNTER — Ambulatory Visit: Payer: Medicaid Other | Admitting: Physician Assistant

## 2020-08-26 ENCOUNTER — Encounter: Payer: Self-pay | Admitting: Physician Assistant

## 2020-08-26 ENCOUNTER — Ambulatory Visit (INDEPENDENT_AMBULATORY_CARE_PROVIDER_SITE_OTHER)
Admission: RE | Admit: 2020-08-26 | Discharge: 2020-08-26 | Disposition: A | Discharge status: Home / Self Care | Payer: Medicaid Other | Source: Ambulatory Visit | Attending: Vascular Surgery | Admitting: Vascular Surgery

## 2020-08-26 ENCOUNTER — Other Ambulatory Visit: Payer: Self-pay

## 2020-08-26 ENCOUNTER — Ambulatory Visit (HOSPITAL_COMMUNITY)
Admission: RE | Admit: 2020-08-26 | Discharge: 2020-08-26 | Disposition: A | Discharge status: Home / Self Care | Payer: Medicaid Other | Source: Ambulatory Visit | Attending: Vascular Surgery | Admitting: Vascular Surgery

## 2020-08-26 VITALS — BP 139/91 | HR 60 | Temp 97.6°F | Resp 20 | Ht 64.0 in | Wt 123.4 lb

## 2020-08-26 DIAGNOSIS — I739 Peripheral vascular disease, unspecified: Secondary | ICD-10-CM | POA: Insufficient documentation

## 2020-08-26 NOTE — Progress Notes (Signed)
[facility] [place] HISTORY AND PHYSICAL   History of Present Illness:  Patient is a 60 y.o. year old female who presents for evaluation of PAD s/p stenting of her left common iliac artery by Dr. Donzetta Matters on 07/13/19.  She was last seen 08/14/19 by Dr. Donzetta Matters with bilateral lower extremity right greater than left claudication with known bilateral SFA disease.  ABIs are moderately decreased bilaterally right is worse than left which is consistent with her symptoms.  She does not have any tissue loss or ulceration.  Unfortunately does not have any vein for bypass.  She was still an everyday smoker and is trying to cut down.    She has calluses  that seem to cause her more discomfort than calf pain with ambulation.  She denise rest pain, non healing ulcers and claudication.  She does not walk much throughout her day.    She is here for Aortic duplex and ABI's.    Past Medical History:  Diagnosis Date   Anemia    Essential hypertension    HTN (hypertension) 05/01/2018   Hyperlipidemia    Hypertension    PAD (peripheral artery disease) (HCC)    PVD (peripheral vascular disease) ([facility]) 08/26/2018    Past Surgical History:  Procedure Laterality Date   ABDOMINAL AORTOGRAM W/LOWER EXTREMITY Bilateral 07/13/2019   Procedure: ABDOMINAL AORTOGRAM W/LOWER EXTREMITY;  Surgeon: Waynetta Sandy, MD;  Location: [facility];  Service: Cardiovascular;  Laterality: Bilateral;   BREAST EXCISIONAL BIOPSY Left    LESS THAN 5 YEARS AGO   PERIPHERAL VASCULAR INTERVENTION Left 07/13/2019   Procedure: PERIPHERAL VASCULAR INTERVENTION;  Surgeon: Waynetta Sandy, MD;  Location: [facility];  Service: Cardiovascular;  Laterality: Left;  common iliac    ROS:   General:  No weight loss, Fever, chills  HEENT: No recent headaches, no nasal bleeding, no visual changes, no sore throat  Neurologic: No dizziness, blackouts, seizures. No recent symptoms of stroke or mini-  stroke. No recent episodes of slurred speech, or temporary blindness.  Cardiac: No recent episodes of chest pain/pressure, no shortness of breath at rest.  No shortness of breath with exertion.  Denies history of atrial fibrillation or irregular heartbeat  Vascular: No history of rest pain in feet.  No history of claudication.  No history of non-healing ulcer, No history of DVT   Pulmonary: No home oxygen, no productive cough, no hemoptysis,  No asthma or wheezing  Musculoskeletal:  '[ ]'$  Arthritis, '[ ]'$  Low back pain,  '[ ]'$  Joint pain  Hematologic:No history of hypercoagulable state.  No history of easy bleeding.  No history of anemia  Gastrointestinal: No hematochezia or melena,  No gastroesophageal reflux, no trouble swallowing  Urinary: '[ ]'$  chronic Kidney disease, '[ ]'$  on HD - '[ ]'$  MWF or '[ ]'$  TTHS, '[ ]'$  Burning with urination, '[ ]'$  Frequent urination, '[ ]'$  Difficulty urinating;   Skin: No rashes  Psychological: No history of anxiety,  No history of depression  Social History Social History   Tobacco Use   Smoking status: Every Day    Packs/day: 0.50    Types: Cigarettes   Smokeless tobacco: Never   Tobacco comments:    6-10 cigs daily  Vaping Use   Vaping Use: Never used  Substance Use Topics   Alcohol use: Yes    Comment: 1 per day   Drug use: Never    Family History Family History  Problem Relation Age of Onset  Heart disease Mother    Hypertension Mother    Hyperlipidemia Mother    Hypertension Brother    Heart disease Maternal Aunt    Hypertension Maternal Aunt    Hyperlipidemia Maternal Aunt    Colon cancer Neg Hx    Liver cancer Neg Hx    Rectal cancer Neg Hx    Stomach cancer Neg Hx     Allergies  Allergies  Allergen Reactions   Latex Rash     Current Outpatient Medications  Medication Sig Dispense Refill   amLODipine (NORVASC) 10 MG tablet Take 1 tablet (10 mg total) by mouth daily. Must have office visit for refills 30 tablet 6   aspirin 81 MG  chewable tablet Chew 81 mg by mouth daily.     atorvastatin (LIPITOR) 20 MG tablet Take 1.5 tablets (30 mg total) by mouth daily. 135 tablet 3   Blood Pressure Monitor DEVI Use as directed to check home blood pressure 2-3 times a week 1 Device 0   carvedilol (COREG) 25 MG tablet Take 1 tablet (25 mg total) by mouth 2 (two) times daily. 180 tablet 3   cholecalciferol (VITAMIN D3) 25 MCG (1000 UT) tablet Take 1,000 Units by mouth daily.     cilostazol (PLETAL) 100 MG tablet Take 1 tablet (100 mg total) by mouth 2 (two) times daily. 180 tablet 3   ferrous sulfate 325 (65 FE) MG tablet Take 1 tablet (325 mg total) by mouth 2 (two) times daily with a meal. 100 tablet 1   hydrochlorothiazide (HYDRODIURIL) 12.5 MG tablet Take 1 tablet (12.5 mg total) by mouth daily. 90 tablet 3   lisinopril (ZESTRIL) 5 MG tablet Take 1 tablet (5 mg total) by mouth daily. 30 tablet 3   omeprazole (PRILOSEC) 40 MG capsule Take 1 capsule (40 mg total) by mouth daily. 30 capsule 3   potassium chloride (KLOR-CON) 8 MEQ tablet Take 2 tablets (16 mEq total) by mouth daily. 60 tablet 3   No current facility-administered medications for this visit.    Physical Examination  Vitals:   08/26/20 0852  BP: (!) 139/91  Pulse: 60  Resp: 20  Temp: 97.6 F (36.4 C)  SpO2: 96%     There is no height or weight on file to calculate BMI.  General:  Alert and oriented, no acute distress HEENT: Normal Neck: No bruit or JVD Pulmonary: Clear to auscultation bilaterally Cardiac: Regular Rate and Rhythm without murmur Abdomen: Soft, non-tender, non-distended, no mass, no scars Skin: No rash, plantar callus right foot without open wounds Extremity Pulses:  2+ radial, brachial, femoral, dorsalis pedis, posterior tibial pulses bilaterally Musculoskeletal: No deformity or edema  Neurologic: Upper and lower extremity motor 5/5 and symmetric  DATA:     Abdominal Aorta Findings:   +-------------+-------+----------+----------+--------+--------+--------+  Location     AP (cm)Trans (cm)PSV (cm/s)WaveformThrombusComments  +-------------+-------+----------+----------+--------+--------+--------+  Proximal                      50                                  +-------------+-------+----------+----------+--------+--------+--------+  Mid                           54                                  +-------------+-------+----------+----------+--------+--------+--------+  Distal                        91                                  +-------------+-------+----------+----------+--------+--------+--------+  RT CIA Prox                   59                                  +-------------+-------+----------+----------+--------+--------+--------+  RT CIA Mid                    137                                 +-------------+-------+----------+----------+--------+--------+--------+  RT CIA Distal                 152                                 +-------------+-------+----------+----------+--------+--------+--------+  RT EIA Prox                   127                                 +-------------+-------+----------+----------+--------+--------+--------+  RT EIA Mid                    97                                  +-------------+-------+----------+----------+--------+--------+--------+  RT EIA Distal                 109                                 +-------------+-------+----------+----------+--------+--------+--------+  LT CIA Prox                                             stent     +-------------+-------+----------+----------+--------+--------+--------+  LT CIA Mid                                              stent     +-------------+-------+----------+----------+--------+--------+--------+  LT CIA Distal                 93                                   +-------------+-------+----------+----------+--------+--------+--------+  LT EIA Prox                   164                                 +-------------+-------+----------+----------+--------+--------+--------+  LT EIA Mid                    83                                  +-------------+-------+----------+----------+--------+--------+--------+  LT EIA Distal                 123                                 +-------------+-------+----------+----------+--------+--------+--------+     Left Stent(s):  +---------------+--------+--------+--------+--------+  CIA            PSV cm/sStenosisWaveformComments  +---------------+--------+--------+--------+--------+  Proximal Stent 107                               +---------------+--------+--------+--------+--------+  Mid Stent      124                               +---------------+--------+--------+--------+--------+  Distal Stent   143                               +---------------+--------+--------+--------+--------+  Distal to Stent93                                +---------------+--------+--------+--------+--------+         ABI Findings:  +---------+------------------+-----+-------------------+--------+  Right    Rt Pressure (mmHg)IndexWaveform           Comment   +---------+------------------+-----+-------------------+--------+  Brachial 158                                                 +---------+------------------+-----+-------------------+--------+  ATA      94                0.58 dampened monophasic          +---------+------------------+-----+-------------------+--------+  PTA      95                0.58 dampened monophasic          +---------+------------------+-----+-------------------+--------+  Great Toe64                0.39 Abnormal                     +---------+------------------+-----+-------------------+--------+    +---------+------------------+-----+-------------------+-------+  Left     Lt Pressure (mmHg)IndexWaveform           Comment  +---------+------------------+-----+-------------------+-------+  Brachial 163                                                +---------+------------------+-----+-------------------+-------+  ATA      98                0.60 dampened monophasic         +---------+------------------+-----+-------------------+-------+  PTA  97                0.60 dampened monophasic         +---------+------------------+-----+-------------------+-------+  Great Toe74                0.45 Abnormal                    +---------+------------------+-----+-------------------+-------+   +-------+-----------+-----------+------------+------------+  ABI/TBIToday's ABIToday's TBIPrevious ABIPrevious TBI  +-------+-----------+-----------+------------+------------+  Right  0.58       0.39       0.49        0.19          +-------+-----------+-----------+------------+------------+  Left   0.6        0.45       0.57        0.38          +-------+-----------+-----------+------------+------------+        Bilateral ABIs appear essentially unchanged.     Summary:  Right: Resting right ankle-brachial index indicates moderate right lower  extremity arterial disease. The right toe-brachial index is abnormal.   Left: Resting left ankle-brachial index indicates moderate left lower  extremity arterial disease. The left toe-brachial index is abnormal.      Summary:   Abdominal Aorta: Aorta/ Iliac: no evidence of stenosis or occlusion.  Patent Left CIA stent.      ASSESSMENT:  PAD with history of claudication symptoms.  History of left CIA stenosis s/p stenting of her left common iliac artery by Dr. Donzetta Matters on 07/13/19.  On Angiogram: The right common femoral artery appears heavily diseased with stenosis greater than 50% of the profunda.  The SFA  is diminutive and then occludes and appears to reconstitute below-knee popliteal artery with at least posterior tibial and anterior tibial artery runoff to the ankle the left side similarly has common femoral disease although by ultrasound the anterior wall was free of disease.  SFA occludes appears to flush occluded.  She reconstitutes what appears to be the TP trunk may be popliteal and has anterior tibial and posterior tibial runoff is the dominant to the ankle.  PLAN: I have encouraged her start a walking program and continue to working on smoking cessation.  She will f/u in 1 year for surveillance studies.  If she develops sever claudication, rest pain or non healing ulcer she will call sooner ASA and Lipitor daily  Roxy Horseman PA-C [facility] [place] Office: [phone]  MD in clinic [place]

## 2020-08-26 NOTE — Progress Notes (Deleted)
[facility] HISTORY AND PHYSICAL   History of Present Illness:  Patient is a 60 y.o. year old female who presents for evaluation of PAD s/p stenting of her left common iliac artery by Dr. Donzetta Matters on 07/13/19.  She was last seen 08/14/19 by Dr. Donzetta Matters with bilateral lower extremity right greater than left claudication with known bilateral SFA disease.  ABIs are moderately decreased bilaterally right is worse than left which is consistent with her symptoms.  She does not have any tissue loss or ulceration.  Unfortunately does not have any vein for bypass.  She was still an everyday smoker as of her last visit.   She is here for Aortic duplex and ABI's.    Past Medical History:  Diagnosis Date   Anemia    Essential hypertension    HTN (hypertension) 05/01/2018   Hyperlipidemia    Hypertension    PAD (peripheral artery disease) (HCC)    PVD (peripheral vascular disease) ([facility]) 08/26/2018    Past Surgical History:  Procedure Laterality Date   ABDOMINAL AORTOGRAM W/LOWER EXTREMITY Bilateral 07/13/2019   Procedure: ABDOMINAL AORTOGRAM W/LOWER EXTREMITY;  Surgeon: Waynetta Sandy, MD;  Location: [facility];  Service: Cardiovascular;  Laterality: Bilateral;   BREAST EXCISIONAL BIOPSY Left    LESS THAN 5 YEARS AGO   PERIPHERAL VASCULAR INTERVENTION Left 07/13/2019   Procedure: PERIPHERAL VASCULAR INTERVENTION;  Surgeon: Waynetta Sandy, MD;  Location: [facility];  Service: Cardiovascular;  Laterality: Left;  common iliac    ROS:   General:  No weight loss, Fever, chills  HEENT: No recent headaches, no nasal bleeding, no visual changes, no sore throat  Neurologic: No dizziness, blackouts, seizures. No recent symptoms of stroke or mini- stroke. No recent episodes of slurred speech, or temporary blindness.  Cardiac: No recent episodes of chest pain/pressure, no shortness of breath at rest.  No shortness of breath with exertion.  Denies  history of atrial fibrillation or irregular heartbeat  Vascular: No history of rest pain in feet.  No history of claudication.  No history of non-healing ulcer, No history of DVT   Pulmonary: No home oxygen, no productive cough, no hemoptysis,  No asthma or wheezing  Musculoskeletal:  '[ ]'$  Arthritis, '[ ]'$  Low back pain,  '[ ]'$  Joint pain  Hematologic:No history of hypercoagulable state.  No history of easy bleeding.  No history of anemia  Gastrointestinal: No hematochezia or melena,  No gastroesophageal reflux, no trouble swallowing  Urinary: '[ ]'$  chronic Kidney disease, '[ ]'$  on HD - '[ ]'$  MWF or '[ ]'$  TTHS, '[ ]'$  Burning with urination, '[ ]'$  Frequent urination, '[ ]'$  Difficulty urinating;   Skin: No rashes  Psychological: No history of anxiety,  No history of depression  Social History Social History   Tobacco Use   Smoking status: Every Day    Packs/day: 0.50    Types: Cigarettes   Smokeless tobacco: Never   Tobacco comments:    6-10 cigs daily  Vaping Use   Vaping Use: Never used  Substance Use Topics   Alcohol use: Yes    Comment: 1 per day   Drug use: Never    Family History Family History  Problem Relation Age of Onset   Heart disease Mother    Hypertension Mother    Hyperlipidemia Mother    Hypertension Brother    Heart disease Maternal Aunt    Hypertension Maternal Aunt    Hyperlipidemia Maternal Aunt  Colon cancer Neg Hx    Liver cancer Neg Hx    Rectal cancer Neg Hx    Stomach cancer Neg Hx     Allergies  Allergies  Allergen Reactions   Latex Rash     Current Outpatient Medications  Medication Sig Dispense Refill   amLODipine (NORVASC) 10 MG tablet Take 1 tablet (10 mg total) by mouth daily. Must have office visit for refills 30 tablet 6   aspirin 81 MG chewable tablet Chew 81 mg by mouth daily.     atorvastatin (LIPITOR) 20 MG tablet Take 1.5 tablets (30 mg total) by mouth daily. 135 tablet 3   Blood Pressure Monitor DEVI Use as directed to check home  blood pressure 2-3 times a week 1 Device 0   carvedilol (COREG) 25 MG tablet Take 1 tablet (25 mg total) by mouth 2 (two) times daily. 180 tablet 3   cholecalciferol (VITAMIN D3) 25 MCG (1000 UT) tablet Take 1,000 Units by mouth daily.     cilostazol (PLETAL) 100 MG tablet Take 1 tablet (100 mg total) by mouth 2 (two) times daily. 180 tablet 3   ferrous sulfate 325 (65 FE) MG tablet Take 1 tablet (325 mg total) by mouth 2 (two) times daily with a meal. 100 tablet 1   hydrochlorothiazide (HYDRODIURIL) 12.5 MG tablet Take 1 tablet (12.5 mg total) by mouth daily. 90 tablet 3   lisinopril (ZESTRIL) 5 MG tablet Take 1 tablet (5 mg total) by mouth daily. 30 tablet 3   omeprazole (PRILOSEC) 40 MG capsule Take 1 capsule (40 mg total) by mouth daily. 30 capsule 3   potassium chloride (KLOR-CON) 8 MEQ tablet Take 2 tablets (16 mEq total) by mouth daily. 60 tablet 3   No current facility-administered medications for this visit.    Physical Examination  There were no vitals filed for this visit.  There is no height or weight on file to calculate BMI.  General:  Alert and oriented, no acute distress HEENT: Normal Neck: No bruit or JVD Pulmonary: Clear to auscultation bilaterally Cardiac: Regular Rate and Rhythm without murmur Abdomen: Soft, non-tender, non-distended, no mass, no scars Skin: No rash Extremity Pulses:  2+ radial, brachial, femoral, dorsalis pedis, posterior tibial pulses bilaterally Musculoskeletal: No deformity or edema  Neurologic: Upper and lower extremity motor 5/5 and symmetric  DATA: ***   ASSESSMENT: ***   PLAN: ***   Roxy Horseman PA-C [facility] Office: [phone]  MD in clinic [place]

## 2020-09-20 ENCOUNTER — Encounter: Payer: Medicaid Other | Admitting: Thoracic Surgery (Cardiothoracic Vascular Surgery)

## 2020-09-20 ENCOUNTER — Other Ambulatory Visit: Payer: Medicaid Other

## 2020-09-21 ENCOUNTER — Encounter: Payer: Self-pay | Admitting: Thoracic Surgery (Cardiothoracic Vascular Surgery)

## 2020-09-27 ENCOUNTER — Ambulatory Visit: Payer: Medicaid Other | Admitting: Cardiology

## 2020-10-10 ENCOUNTER — Other Ambulatory Visit: Payer: Self-pay | Admitting: Internal Medicine

## 2020-10-10 DIAGNOSIS — E876 Hypokalemia: Secondary | ICD-10-CM

## 2020-10-10 NOTE — Telephone Encounter (Signed)
Patient is out of this med. Medication Refill - Medication: potassium chloride (KLOR-CON) 8 MEQ tablet   Has the patient contacted their pharmacy? yes (Agent: If no, request that the patient contact the pharmacy for the refill.) (Agent: If yes, when and what did the pharmacy advise?)contact pcp  Preferred Pharmacy (with phone number or street name):  [facility] [address] Phone:  [phone]  Fax:  [phone]      Agent: Please be advised that RX refills may take up to 3 business days. We ask that you follow-up with your pharmacy.

## 2020-10-11 NOTE — Telephone Encounter (Signed)
Requested medications are due for refill today.  yes  Requested medications are on the active medications list.  yes  Last refill. 05/20/2020  Future visit scheduled.   no  Notes to clinic.  No protocol.

## 2020-10-12 MED ORDER — POTASSIUM CHLORIDE ER 8 MEQ PO TBCR: 16.0000 meq | EXTENDED_RELEASE_TABLET | Freq: Every day | ORAL | 0 refills | Status: DC

## 2020-10-12 NOTE — Telephone Encounter (Signed)
Patient called in to check the status of refill on medication below stated that she have been without this medication about a week now. Please advise and call Ph#  [phone]

## 2020-10-19 ENCOUNTER — Telehealth: Payer: Self-pay | Admitting: Internal Medicine

## 2020-10-19 NOTE — Telephone Encounter (Signed)
Rx was sent on 9/14

## 2020-10-19 NOTE — Telephone Encounter (Signed)
Pt called and stated that she received an auto message when she called the pharmacy that her RX for potassium chloride (KLOR-CON) 8 MEQ tablet needs Doctor authorization/ I advised pt the refill was sent on 9.14.22/ please advise

## 2020-10-19 NOTE — Telephone Encounter (Signed)
Pt called to schedule an appt next available on 11/09/20. Please advise.

## 2020-10-19 NOTE — Telephone Encounter (Signed)
Patient called, left VM to return the call to the office to schedule an OV. Noted on the last refill of potassium "Must have office visit for refills."

## 2020-11-09 ENCOUNTER — Other Ambulatory Visit: Payer: Self-pay

## 2020-11-09 ENCOUNTER — Ambulatory Visit: Payer: Medicaid Other | Attending: Physician Assistant | Admitting: Physician Assistant

## 2020-11-09 ENCOUNTER — Encounter: Payer: Self-pay | Admitting: Physician Assistant

## 2020-11-09 VITALS — BP 207/115 | HR 71 | Resp 16 | Wt 131.0 lb

## 2020-11-09 DIAGNOSIS — Z760 Encounter for issue of repeat prescription: Secondary | ICD-10-CM | POA: Diagnosis not present

## 2020-11-09 DIAGNOSIS — E876 Hypokalemia: Secondary | ICD-10-CM | POA: Diagnosis not present

## 2020-11-09 DIAGNOSIS — I739 Peripheral vascular disease, unspecified: Secondary | ICD-10-CM

## 2020-11-09 DIAGNOSIS — Z7982 Long term (current) use of aspirin: Secondary | ICD-10-CM | POA: Diagnosis not present

## 2020-11-09 DIAGNOSIS — T464X5A Adverse effect of angiotensin-converting-enzyme inhibitors, initial encounter: Secondary | ICD-10-CM

## 2020-11-09 DIAGNOSIS — Z7182 Exercise counseling: Secondary | ICD-10-CM | POA: Insufficient documentation

## 2020-11-09 DIAGNOSIS — R058 Other specified cough: Secondary | ICD-10-CM | POA: Diagnosis not present

## 2020-11-09 DIAGNOSIS — Z79899 Other long term (current) drug therapy: Secondary | ICD-10-CM | POA: Diagnosis not present

## 2020-11-09 DIAGNOSIS — R059 Cough, unspecified: Secondary | ICD-10-CM | POA: Diagnosis not present

## 2020-11-09 DIAGNOSIS — I10 Essential (primary) hypertension: Secondary | ICD-10-CM

## 2020-11-09 MED ORDER — OMEPRAZOLE 40 MG PO CPDR: 40.0000 mg | DELAYED_RELEASE_CAPSULE | Freq: Every day | ORAL | 3 refills | Status: DC

## 2020-11-09 MED ORDER — POTASSIUM CHLORIDE ER 8 MEQ PO TBCR: 16.0000 meq | EXTENDED_RELEASE_TABLET | Freq: Every day | ORAL | 0 refills | Status: DC

## 2020-11-09 MED ORDER — CLONIDINE HCL 0.2 MG PO TABS: 0.2000 mg | ORAL_TABLET | Freq: Once | ORAL | Status: DC

## 2020-11-09 MED ORDER — CARVEDILOL 25 MG PO TABS: 25.0000 mg | ORAL_TABLET | Freq: Two times a day (BID) | ORAL | 3 refills | Status: DC

## 2020-11-09 MED ORDER — ASPIRIN 81 MG PO CHEW: 81.0000 mg | CHEWABLE_TABLET | Freq: Every day | ORAL | 1 refills | Status: DC

## 2020-11-09 MED ORDER — ATORVASTATIN CALCIUM 20 MG PO TABS: 30.0000 mg | ORAL_TABLET | Freq: Every day | ORAL | 3 refills | Status: DC

## 2020-11-09 MED ORDER — HYDROCHLOROTHIAZIDE 12.5 MG PO TABS: 12.5000 mg | ORAL_TABLET | Freq: Every day | ORAL | 3 refills | Status: DC

## 2020-11-09 MED ORDER — LOSARTAN POTASSIUM 50 MG PO TABS: 50.0000 mg | ORAL_TABLET | Freq: Every day | ORAL | 0 refills | Status: DC

## 2020-11-09 MED ORDER — AMLODIPINE BESYLATE 10 MG PO TABS: 10.0000 mg | ORAL_TABLET | Freq: Every day | ORAL | 6 refills | Status: DC

## 2020-11-09 MED ORDER — CILOSTAZOL 100 MG PO TABS: 100.0000 mg | ORAL_TABLET | Freq: Two times a day (BID) | ORAL | 3 refills | Status: DC

## 2020-11-09 MED ORDER — CLONIDINE HCL 0.1 MG PO TABS: 0.1000 mg | ORAL_TABLET | Freq: Once | ORAL | Status: DC

## 2020-11-09 NOTE — Progress Notes (Signed)
Patient ID: Brooke Chapman, female   DOB: [birth year], 60 y.o.   MRN: [id number]   Brooke Chapman, is a 60 y.o. female  [id number]:[id number]  [id number]  DOB - [birth year]  Chief Complaint  Patient presents with   Hypertension       Subjective:   Brooke Chapman is a 59 y.o. female here today for med RF.  She has not taken any meds today except 1 carvedilol.  Says she is compliant with meds but pharmacy states inconsistent filling of meds.  She says she has been having consistent dry cough at night since starting lisinopril.  BP at home have been high-160-200/85-100.  No CP/HA/Dizziness. Takes iron 2 times weekly  Admits to drinking some/little alcohol and 3 cups caffeine daily.  Denies illicit or other stimulants including cocaine   No problems updated.  ALLERGIES: Allergies  Allergen Reactions   Latex Rash    PAST MEDICAL HISTORY: Past Medical History:  Diagnosis Date   Anemia    Essential hypertension    HTN (hypertension) 05/01/2018   Hyperlipidemia    Hypertension    PAD (peripheral artery disease) (HCC)    PVD (peripheral vascular disease) ([place]) 08/26/2018    MEDICATIONS AT HOME: Prior to Admission medications   Medication Sig Start Date End Date Taking? Authorizing Provider  losartan (COZAAR) 50 MG tablet Take 1 tablet (50 mg total) by mouth daily. 11/09/20  Yes Freeman Caldron M, PA-C  amLODipine (NORVASC) 10 MG tablet Take 1 tablet (10 mg total) by mouth daily. Must have office visit for refills 11/09/20   Mathis Dad  aspirin 81 MG chewable tablet Chew 1 tablet (81 mg total) by mouth daily. 11/09/20   Argentina Donovan, PA-C  atorvastatin (LIPITOR) 20 MG tablet Take 1.5 tablets (30 mg total) by mouth daily. 11/09/20   Argentina Donovan, PA-C  Blood Pressure Monitor DEVI Use as directed to check home blood pressure 2-3 times a week 07/28/18   Ladell Pier, MD  carvedilol (COREG) 25 MG tablet Take 1 tablet (25 mg total) by mouth 2 (two) times daily. 11/09/20  02/07/21  Argentina Donovan, PA-C  cholecalciferol (VITAMIN D3) 25 MCG (1000 UT) tablet Take 1,000 Units by mouth daily.    [provider]  cilostazol (PLETAL) 100 MG tablet Take 1 tablet (100 mg total) by mouth 2 (two) times daily. 11/09/20   Argentina Donovan, PA-C  ferrous sulfate 325 (65 FE) MG tablet Take 1 tablet (325 mg total) by mouth 2 (two) times daily with a meal. 12/01/19   Ladell Pier, MD  hydrochlorothiazide (HYDRODIURIL) 12.5 MG tablet Take 1 tablet (12.5 mg total) by mouth daily. 11/09/20   Argentina Donovan, PA-C  omeprazole (PRILOSEC) 40 MG capsule Take 1 capsule (40 mg total) by mouth daily. 11/09/20   Argentina Donovan, PA-C  potassium chloride (KLOR-CON) 8 MEQ tablet Take 2 tablets (16 mEq total) by mouth daily. 11/09/20   Lizeth Bencosme, Dionne Bucy, PA-C    ROS: Neg HEENT +cough at night, dry, since lisinopril Neg cardiac Neg GI Neg GU Neg MS Neg psych Neg neuro  Objective:   Vitals:   11/09/20 1351  BP: (!) 219/122  Pulse: 71  Resp: 16  SpO2: 97%  Weight: 131 lb (59.4 kg)   Manual Bp check-200/100 at 2:07 pm by me.    Exam General appearance : Awake, alert, not in any distress. Speech and TP scattered.  Difficult to follow.  Not toxic looking HEENT: Atraumatic  and Normocephalic Neck: Supple, no JVD. No cervical lymphadenopathy.  Chest: Good air entry bilaterally, CTAB.  No rales/rhonchi/wheezing CVS: S1 S2 regular with systolic murmur 2/6  Extremities: B/L Lower Ext shows no edema, both legs are warm to touch Neurology: Awake alert, and oriented X 3, CN II-XII intact, Non focal Skin: No Rash  Data Review Lab Results  Component Value Date   HGBA1C 5.3 (A) 04/29/2020   HGBA1C 5.8 (H) 11/26/2019    Assessment & Plan   1. Hypertension, unspecified type Uncontrolled.  Doubt compliance due to pharmacy reporting inconsistent RF.  Patient very difficult to follow and only knows pills by size and colors/shapes but only recalls about 3 meds  description - cloNIDine (CATAPRES) tablet 0.1 mg  2. Essential hypertension See #1 - amLODipine (NORVASC) 10 MG tablet; Take 1 tablet (10 mg total) by mouth daily. Must have office visit for refills  Dispense: 30 tablet; Refill: 6 - hydrochlorothiazide (HYDRODIURIL) 12.5 MG tablet; Take 1 tablet (12.5 mg total) by mouth daily.  Dispense: 90 tablet; Refill: 3 -start losartan 50mg  daily.  Check BP daily and record and bring to next visit along with ALL medication bottles  3. PAD (peripheral artery disease) (HCC) - cilostazol (PLETAL) 100 MG tablet; Take 1 tablet (100 mg total) by mouth 2 (two) times daily.  Dispense: 180 tablet; Refill: 3  4. Hypokalemia - potassium chloride (KLOR-CON) 8 MEQ tablet; Take 2 tablets (16 mEq total) by mouth daily.  Dispense: 60 tablet; Refill: 0  5. Cough due to ACE inhibitor Stop lisinopril -CMP, TSH, CBC, lipids  Compliance imperative.  Risks of morbidity and mortality reviewed and patient verbalizes understanding. Call 911 if HA/CP/dizziness  Patient have been counseled extensively about nutrition and exercise. Other issues discussed during this visit include: low cholesterol diet, weight control and daily exercise, foot care, annual eye examinations at Ophthalmology, importance of adherence with medications and regular follow-up. We also discussed long term complications of uncontrolled diabetes and hypertension.   luke in 3-4 weeks for BP;  3 months with PCP for chronic conditions  The patient was given clear instructions to go to ER or return to medical center if symptoms don't improve, worsen or new problems develop. The patient verbalized understanding. The patient was told to call to get lab results if they haven't heard anything in the next week.      Freeman Caldron, PA-C [facility] and [place], [place]   11/09/2020, 2:09 PM

## 2020-11-09 NOTE — Patient Instructions (Signed)
Check blood pressures daily and record. Bring ALL medications to next visit  Hypertension, Adult Hypertension is another name for high blood pressure. High blood pressure forces your heart to work harder to pump blood. This can cause problems over time. There are two numbers in a blood pressure reading. There is a top number (systolic) over a bottom number (diastolic). It is best to have a blood pressure that is below 120/80. Healthy choices can help lower your blood pressure, or you may need medicine to help lower it. What are the causes? The cause of this condition is not known. Some conditions may be related to high blood pressure. What increases the risk? Smoking. Having type 2 diabetes mellitus, high cholesterol, or both. Not getting enough exercise or physical activity. Being overweight. Having too much fat, sugar, calories, or salt (sodium) in your diet. Drinking too much alcohol. Having long-term (chronic) kidney disease. Having a family history of high blood pressure. Age. Risk increases with age. Race. You may be at higher risk if you are African American. Gender. Men are at higher risk than women before age 24. After age 65, women are at higher risk than men. Having obstructive sleep apnea. Stress. What are the signs or symptoms? High blood pressure may not cause symptoms. Very high blood pressure (hypertensive crisis) may cause: Headache. Feelings of worry or nervousness (anxiety). Shortness of breath. Nosebleed. A feeling of being sick to your stomach (nausea). Throwing up (vomiting). Changes in how you see. Very bad chest pain. Seizures. How is this treated? This condition is treated by making healthy lifestyle changes, such as: Eating healthy foods. Exercising more. Drinking less alcohol. Your health care provider may prescribe medicine if lifestyle changes are not enough to get your blood pressure under control, and if: Your top number is above 130. Your bottom  number is above 80. Your personal target blood pressure may vary. Follow these instructions at home: Eating and drinking  If told, follow the DASH eating plan. To follow this plan: Fill one half of your plate at each meal with fruits and vegetables. Fill one fourth of your plate at each meal with whole grains. Whole grains include whole-wheat pasta, brown rice, and whole-grain bread. Eat or drink low-fat dairy products, such as skim milk or low-fat yogurt. Fill one fourth of your plate at each meal with low-fat (lean) proteins. Low-fat proteins include fish, chicken without skin, eggs, beans, and tofu. Avoid fatty meat, cured and processed meat, or chicken with skin. Avoid pre-made or processed food. Eat less than 1,500 mg of salt each day. Do not drink alcohol if: Your doctor tells you not to drink. You are pregnant, may be pregnant, or are planning to become pregnant. If you drink alcohol: Limit how much you use to: 0-1 drink a day for women. 0-2 drinks a day for men. Be aware of how much alcohol is in your drink. In the [place] one drink equals one 12 oz bottle of beer (355 mL), one 5 oz glass of wine (148 mL), or one 1 oz glass of hard liquor (44 mL). Lifestyle  Work with your doctor to stay at a healthy weight or to lose weight. Ask your doctor what the best weight is for you. Get at least 30 minutes of exercise most days of the week. This may include walking, swimming, or biking. Get at least 30 minutes of exercise that strengthens your muscles (resistance exercise) at least 3 days a week. This may include lifting weights or  doing Pilates. Do not use any products that contain nicotine or tobacco, such as cigarettes, e-cigarettes, and chewing tobacco. If you need help quitting, ask your doctor. Check your blood pressure at home as told by your doctor. Keep all follow-up visits as told by your doctor. This is important. Medicines Take over-the-counter and prescription medicines only  as told by your doctor. Follow directions carefully. Do not skip doses of blood pressure medicine. The medicine does not work as well if you skip doses. Skipping doses also puts you at risk for problems. Ask your doctor about side effects or reactions to medicines that you should watch for. Contact a doctor if you: Think you are having a reaction to the medicine you are taking. Have headaches that keep coming back (recurring). Feel dizzy. Have swelling in your ankles. Have trouble with your vision. Get help right away if you: Get a very bad headache. Start to feel mixed up (confused). Feel weak or numb. Feel faint. Have very bad pain in your: Chest. Belly (abdomen). Throw up more than once. Have trouble breathing. Summary Hypertension is another name for high blood pressure. High blood pressure forces your heart to work harder to pump blood. For most people, a normal blood pressure is less than 120/80. Making healthy choices can help lower blood pressure. If your blood pressure does not get lower with healthy choices, you may need to take medicine. This information is not intended to replace advice given to you by your health care provider. Make sure you discuss any questions you have with your health care provider. Document Revised: 09/25/2017 Document Reviewed: 09/25/2017 Elsevier Patient Education  [place].

## 2020-11-10 LAB — CBC WITH DIFFERENTIAL/PLATELET
Basophils Absolute: 0 10*3/uL (ref 0.0–0.2)
Basos: 0 %
EOS (ABSOLUTE): 0 10*3/uL (ref 0.0–0.4)
Eos: 1 %
Hematocrit: 37.6 % (ref 34.0–46.6)
Hemoglobin: 13 g/dL (ref 11.1–15.9)
Immature Grans (Abs): 0 10*3/uL (ref 0.0–0.1)
Immature Granulocytes: 0 %
Lymphocytes Absolute: 2.2 10*3/uL (ref 0.7–3.1)
Lymphs: 46 %
MCH: 32.4 pg (ref 26.6–33.0)
MCHC: 34.6 g/dL (ref 31.5–35.7)
MCV: 94 fL (ref 79–97)
Monocytes Absolute: 0.4 10*3/uL (ref 0.1–0.9)
Monocytes: 8 %
Neutrophils Absolute: 2.2 10*3/uL (ref 1.4–7.0)
Neutrophils: 45 %
Platelets: 209 10*3/uL (ref 150–450)
RBC: 4.01 x10E6/uL (ref 3.77–5.28)
RDW: 13.9 % (ref 11.7–15.4)
WBC: 4.9 10*3/uL (ref 3.4–10.8)

## 2020-11-10 LAB — LIPID PANEL
Chol/HDL Ratio: 3.4 ratio (ref 0.0–4.4)
Cholesterol, Total: 189 mg/dL (ref 100–199)
HDL: 55 mg/dL (ref >39)
LDL Chol Calc (NIH): 115 mg/dL — ABNORMAL HIGH (ref 0–99)
Triglycerides: 108 mg/dL (ref 0–149)
VLDL Cholesterol Cal: 19 mg/dL (ref 5–40)

## 2020-11-10 LAB — COMPREHENSIVE METABOLIC PANEL
ALT: 12 IU/L (ref 0–32)
AST: 21 IU/L (ref 0–40)
Albumin/Globulin Ratio: 1.4 (ref 1.2–2.2)
Albumin: 3.7 g/dL — ABNORMAL LOW (ref 3.8–4.8)
Alkaline Phosphatase: 65 IU/L (ref 44–121)
BUN/Creatinine Ratio: 14 (ref 12–28)
BUN: 16 mg/dL (ref 8–27)
Bilirubin Total: <0.2 mg/dL (ref 0.0–1.2)
CO2: 27 mmol/L (ref 20–29)
Calcium: 8.9 mg/dL (ref 8.7–10.3)
Chloride: 98 mmol/L (ref 96–106)
Creatinine, Ser: 1.14 mg/dL — ABNORMAL HIGH (ref 0.57–1.00)
Globulin, Total: 2.6 g/dL (ref 1.5–4.5)
Glucose: 90 mg/dL (ref 70–99)
Potassium: 4.4 mmol/L (ref 3.5–5.2)
Sodium: 136 mmol/L (ref 134–144)
Total Protein: 6.3 g/dL (ref 6.0–8.5)
eGFR: 54 mL/min/{1.73_m2} — ABNORMAL LOW (ref >59)

## 2020-11-10 LAB — THYROID PANEL WITH TSH
Free Thyroxine Index: 1.9 (ref 1.2–4.9)
T3 Uptake Ratio: 27 % (ref 24–39)
T4, Total: 7 ug/dL (ref 4.5–12.0)
TSH: 1.21 u[IU]/mL (ref 0.450–4.500)

## 2020-11-22 ENCOUNTER — Encounter: Payer: Medicaid Other | Admitting: Thoracic Surgery (Cardiothoracic Vascular Surgery)

## 2020-12-27 ENCOUNTER — Encounter: Payer: Medicaid Other | Admitting: Thoracic Surgery (Cardiothoracic Vascular Surgery)

## 2021-01-10 ENCOUNTER — Encounter: Payer: Medicaid Other | Admitting: Thoracic Surgery (Cardiothoracic Vascular Surgery)

## 2021-01-10 ENCOUNTER — Other Ambulatory Visit: Payer: Self-pay

## 2021-01-26 ENCOUNTER — Ambulatory Visit: Payer: Self-pay

## 2021-01-26 NOTE — Telephone Encounter (Signed)
°  Chief Complaint: elevated BP Symptoms: BP 207/188, 204/121, headache, vision changes Frequency: several days Pertinent Negatives: Patient denies chest pain or numbness Disposition: [x] ED /[] Urgent Care (no appt availability in office) / [] Appointment(In office/virtual)/ []  [facility]/ [] Home Care/ [] Refused Recommended Disposition  Additional Notes: Due to transportation needs, advised pt ED will be best for her so she can call 911 and get transported via EMS. Pt verbalized understanding.    Reason for Disposition  [5] Systolic BP  >= 038 OR Diastolic >= 882 AND [8] cardiac or neurologic symptoms (e.g., chest pain, difficulty breathing, unsteady gait, blurred vision)  Answer Assessment - Initial Assessment Questions 1. BLOOD PRESSURE: "What is the blood pressure?" "Did you take at least two measurements 5 minutes apart?"     207/188, 204/121 2. ONSET: "When did you take your blood pressure?"     Taken this morning 3. HOW: "How did you obtain the blood pressure?" (e.g., visiting nurse, automatic home BP monitor)     Automatic cuff, arm  4. HISTORY: "Do you have a history of high blood pressure?"     Yes 5. MEDICATIONS: "Are you taking any medications for blood pressure?" "Have you missed any doses recently?"     Yes and no 6. OTHER SYMPTOMS: "Do you have any symptoms?" (e.g., headache, chest pain, blurred vision, difficulty breathing, weakness)     headache  Protocols used: Blood Pressure - High-A-AH

## 2021-01-27 ENCOUNTER — Telehealth: Payer: Self-pay

## 2021-01-27 ENCOUNTER — Encounter: Payer: Self-pay | Admitting: Internal Medicine

## 2021-01-27 NOTE — Telephone Encounter (Signed)
Called patient to follow up:   Pt rechecked bp and reading was 212/141. Pt denied any HA, CP, SOB or DOE.   I encouraged pt to go to ED if there are any changes in symptoms. Pt agreed.   Reviewed recommendations with patient about follow up with PharmD. Pt agreed for an appt prior to her follow up with Levada Dy.   I added pt to PharmD schedule for Thursday at 2:20pm

## 2021-01-27 NOTE — Telephone Encounter (Signed)
Review of chart Saw triage note (01/26/21) for elevated BP with symptoms of HA and vision disturbance.   Call to patient to check on well being.   Bp taken this morning at 11:41am: 203/131  Potassium and carvedilol in the morning.   Losartan and carvedilol in the evening. Pt states that she has been taking the hctz at night and is waking up a lot to urinate.   Pt stated that she is not taking the amlodipine 10 mg. Pt looked for it and found a "back up bag" and did find the rx and stated that she has not been taking that medication.   Pt DENIES CP, SOB, DOE, dizziness, HA or vision changes.   Pt stated that she is going to take the amlodipine now. Pt agreed that I can call her back in 30-60 minutes to check on her and have her recheck her bp.

## 2021-01-27 NOTE — Telephone Encounter (Signed)
PC placed to pt this evening.  I left a VMM letting her know that I received her message from yesterday and today.  Advise that if her BP is still running as high as it is and she is still having HA and vision changes, she should be seen in the ER. Also I note that she told the CMA that she was not taking Norvasc.  Informed her that she should be on this medication as it is one of the meds for her BP.  Advise to bring all meds with her when she comes in on 02/02/2021 to see the clinical pharmacist. Message sent to her Mychart also.

## 2021-02-02 ENCOUNTER — Ambulatory Visit: Payer: Medicaid Other | Attending: Internal Medicine | Admitting: Pharmacist

## 2021-02-02 ENCOUNTER — Encounter: Payer: Self-pay | Admitting: Pharmacist

## 2021-02-02 ENCOUNTER — Other Ambulatory Visit: Payer: Self-pay

## 2021-02-02 VITALS — BP 210/117

## 2021-02-02 DIAGNOSIS — I10 Essential (primary) hypertension: Secondary | ICD-10-CM

## 2021-02-02 MED ORDER — LOSARTAN POTASSIUM-HCTZ 100-25 MG PO TABS: 1.0000 | ORAL_TABLET | Freq: Every day | ORAL | 1 refills | Status: DC

## 2021-02-02 NOTE — Progress Notes (Signed)
S:    PCP: Dr. Wynetta Emery  Patient arrives in good spirits. Presents to the clinic for hypertension evaluation, counseling, and management. Patient was referred and last seen by Freeman Caldron on 11/09/2020.  Medication adherence reported at first. She then tells me that sometimes she doesn't feel like taking her medications and admitted to missing 5 doses of her antihypertensives in the last week.   No chest pain, dyspnea, HA, blurred vision.   Current BP Medications include:  amlodipine 10 mg daily, carvedilol 25 mg BID, HCTZ 12.5 mg daily, losartan 50 mg daily  Dietary habits include: non-compliant with salt restriction. Drinks coffee in the mornings.  Exercise habits include: none  Family / Social history:  Fhx: HLD, HTN, heart disease  Tobacco: 0.5 PPD smoker  Alcohol: 1 drink standard per today   O:  Vitals:   02/02/21 1432  BP: (!) 210/117   Home BP readings: none  Last 3 Office BP readings: BP Readings from Last 3 Encounters:  11/09/20 (!) 207/115  08/26/20 (!) 139/91  07/12/20 (!) 140/100    BMET    Component Value Date/Time   NA 136 11/09/2020 1450   K 4.4 11/09/2020 1450   CL 98 11/09/2020 1450   CO2 27 11/09/2020 1450   GLUCOSE 90 11/09/2020 1450   GLUCOSE 88 07/13/2019 0740   BUN 16 11/09/2020 1450   CREATININE 1.14 (H) 11/09/2020 1450   CALCIUM 8.9 11/09/2020 1450   GFRNONAA 68 05/11/2019 1534   GFRAA 79 05/11/2019 1534    Renal function: CrCl cannot be calculated (Patient's most recent lab result is older than the maximum 21 days allowed.).  Clinical ASCVD: No  The ASCVD Risk score (Arnett DK, et al., 2019) failed to calculate for the following reasons:   The valid systolic blood pressure range is 90 to 200 mmHg   A/P: Hypertension diagnosed currently uncontrolled and severely elevated on current medications. BP Goal = < 130/80 mmHg. Medication adherence reported.  -Continued amlodipine, carvedilol at current doses. -Stop losartan,  HCTZ. -Start combination Hyzaar 100-25 mg daily.   -F/u labs ordered - CMP14+eGFR -Counseled on lifestyle modifications for blood pressure control including reduced dietary sodium, increased exercise, adequate sleep.  Results reviewed and written information provided.   Total time in face-to-face counseling 30 minutes.   F/U Clinic Visit in 1 month.    Benard Halsted, PharmD, [facility], [place] [phone]

## 2021-02-03 LAB — CMP14+EGFR
ALT: 8 IU/L (ref 0–32)
AST: 18 IU/L (ref 0–40)
Albumin/Globulin Ratio: 1.4 (ref 1.2–2.2)
Albumin: 4.3 g/dL (ref 3.8–4.8)
Alkaline Phosphatase: 64 IU/L (ref 44–121)
BUN/Creatinine Ratio: 13 (ref 12–28)
BUN: 17 mg/dL (ref 8–27)
Bilirubin Total: 0.2 mg/dL (ref 0.0–1.2)
CO2: 32 mmol/L — ABNORMAL HIGH (ref 20–29)
Calcium: 9.6 mg/dL (ref 8.7–10.3)
Chloride: 93 mmol/L — ABNORMAL LOW (ref 96–106)
Creatinine, Ser: 1.26 mg/dL — ABNORMAL HIGH (ref 0.57–1.00)
Globulin, Total: 3.1 g/dL (ref 1.5–4.5)
Glucose: 64 mg/dL — ABNORMAL LOW (ref 70–99)
Potassium: 3.8 mmol/L (ref 3.5–5.2)
Sodium: 137 mmol/L (ref 134–144)
Total Protein: 7.4 g/dL (ref 6.0–8.5)
eGFR: 48 mL/min/{1.73_m2} — ABNORMAL LOW (ref >59)

## 2021-02-03 NOTE — Progress Notes (Signed)
Let patient know that her kidney function is not 100%.  We have been monitoring this since April of last year.  Advised to drink several glasses of fluids daily.  Avoid prolonged use of over-the-counter pain medications like ibuprofen, Aleve, Advil as these can make kidney function worse.  When she is seen later this month, kidney function and potassium level will need to be rechecked given the changes made in her blood pressure medications on her recent visit with the clinical pharmacist.

## 2021-02-22 ENCOUNTER — Ambulatory Visit: Payer: Medicaid Other | Admitting: Physician Assistant

## 2021-03-02 ENCOUNTER — Ambulatory Visit: Payer: Medicaid Other | Attending: Physician Assistant | Admitting: Physician Assistant

## 2021-03-02 ENCOUNTER — Other Ambulatory Visit: Payer: Self-pay

## 2021-03-02 ENCOUNTER — Encounter: Payer: Self-pay | Admitting: Physician Assistant

## 2021-03-02 VITALS — BP 151/97 | HR 63 | Resp 16 | Wt 138.4 lb

## 2021-03-02 DIAGNOSIS — I10 Essential (primary) hypertension: Secondary | ICD-10-CM | POA: Diagnosis not present

## 2021-03-02 DIAGNOSIS — D509 Iron deficiency anemia, unspecified: Secondary | ICD-10-CM

## 2021-03-02 DIAGNOSIS — E876 Hypokalemia: Secondary | ICD-10-CM

## 2021-03-02 DIAGNOSIS — E782 Mixed hyperlipidemia: Secondary | ICD-10-CM

## 2021-03-02 DIAGNOSIS — Z91199 Patient's noncompliance with other medical treatment and regimen due to unspecified reason: Secondary | ICD-10-CM | POA: Diagnosis not present

## 2021-03-02 MED ORDER — CARVEDILOL 25 MG PO TABS: 25.0000 mg | ORAL_TABLET | Freq: Two times a day (BID) | ORAL | 3 refills | Status: DC

## 2021-03-02 MED ORDER — POTASSIUM CHLORIDE ER 8 MEQ PO TBCR: 16.0000 meq | EXTENDED_RELEASE_TABLET | Freq: Every day | ORAL | 1 refills | Status: DC

## 2021-03-02 MED ORDER — ATORVASTATIN CALCIUM 20 MG PO TABS: 30.0000 mg | ORAL_TABLET | Freq: Every day | ORAL | 3 refills | Status: DC

## 2021-03-02 MED ORDER — AMLODIPINE BESYLATE 10 MG PO TABS: 10.0000 mg | ORAL_TABLET | Freq: Every day | ORAL | 1 refills | Status: DC

## 2021-03-02 NOTE — Progress Notes (Signed)
Patient ID: Brooke Chapman, female   DOB: [birth year], 61 y.o.   MRN: [id number]     Brooke Chapman, is a 61 y.o. female  [id number]  [id number]  DOB - [birth year]  Chief Complaint  Patient presents with   Hypertension       Subjective:   Brooke Chapman is a 61 y.o. female here today for a follow up visit trying to get BP managed.  However, she did not take carvedilol or amlodipine yesterday or today.  She is tolerating the new Hyzaar without any problems.  She spaces out when she takes her meds throughout the day.  She is getting low on some RF. Denies HA/CP.  Partial compliance   From note with Pharm D 02/02/2021: -Continued amlodipine, carvedilol at current doses. -Stop losartan, HCTZ. -Start combination Hyzaar 100-25 mg daily.   -F/u labs ordered - CMP14+eGFR   Patient has No headache, No chest pain, No abdominal pain - No Nausea, No new weakness tingling or numbness, No Cough - SOB.  No problems updated.  ALLERGIES: Allergies  Allergen Reactions   Latex Rash    PAST MEDICAL HISTORY: Past Medical History:  Diagnosis Date   Anemia    Essential hypertension    HTN (hypertension) 05/01/2018   Hyperlipidemia    Hypertension    PAD (peripheral artery disease) (HCC)    PVD (peripheral vascular disease) ([place]) 08/26/2018    MEDICATIONS AT HOME: Prior to Admission medications   Medication Sig Start Date End Date Taking? Authorizing Provider  aspirin 81 MG chewable tablet Chew 1 tablet (81 mg total) by mouth daily. 11/09/20  Yes Argentina Donovan, PA-C  Blood Pressure Monitor DEVI Use as directed to check home blood pressure 2-3 times a week 07/28/18  Yes Ladell Pier, MD  cholecalciferol (VITAMIN D3) 25 MCG (1000 UT) tablet Take 1,000 Units by mouth daily.   Yes [provider]  cilostazol (PLETAL) 100 MG tablet Take 1 tablet (100 mg total) by mouth 2 (two) times daily. 11/09/20  Yes Argentina Donovan, PA-C  ferrous sulfate 325 (65 FE) MG tablet Take 1 tablet  (325 mg total) by mouth 2 (two) times daily with a meal. 12/01/19  Yes Ladell Pier, MD  amLODipine (NORVASC) 10 MG tablet Take 1 tablet (10 mg total) by mouth daily. Must have office visit for refills 03/02/21   Argentina Donovan, PA-C  atorvastatin (LIPITOR) 20 MG tablet Take 1.5 tablets (30 mg total) by mouth daily. 03/02/21   Argentina Donovan, PA-C  carvedilol (COREG) 25 MG tablet Take 1 tablet (25 mg total) by mouth 2 (two) times daily. 03/02/21 05/31/21  Argentina Donovan, PA-C  losartan-hydrochlorothiazide (HYZAAR) 100-25 MG tablet Take 1 tablet by mouth daily. 02/02/21   Ladell Pier, MD  potassium chloride (KLOR-CON) 8 MEQ tablet Take 2 tablets (16 mEq total) by mouth daily. 03/02/21   Chayson Charters, Dionne Bucy, PA-C    ROS: Neg HEENT Neg resp Neg cardiac Neg GI Neg GU Neg MS Neg psych Neg neuro  Objective:   Vitals:   03/02/21 1503  BP: (!) 151/97  Pulse: 63  Resp: 16  SpO2: 99%  Weight: 138 lb 6.4 oz (62.8 kg)   Exam General appearance : Awake, alert, not in any distress. Speech Clear. Not toxic looking HEENT: Atraumatic and Normocephalic Neck: Supple, no JVD. No cervical lymphadenopathy.  Chest: Good air entry bilaterally, CTAB.  No rales/rhonchi/wheezing CVS: S1 S2 regular, no murmurs.  Extremities: B/L Lower Ext shows  no edema, both legs are warm to touch Neurology: Awake alert, and oriented X 3, CN II-XII intact, Non focal Skin: No Rash  Data Review Lab Results  Component Value Date   HGBA1C 5.3 (A) 04/29/2020   HGBA1C 5.8 (H) 11/26/2019    Assessment & Plan   1. Hypokalemia - potassium chloride (KLOR-CON) 8 MEQ tablet; Take 2 tablets (16 mEq total) by mouth daily.  Dispense: 180 tablet; Refill: 1  2. Essential hypertension Improved not at goal.  Continue Hyzaar and discussed not "spacing meds out" but rather taking them as prescribed. Check BP OOO and record. We have discussed target BP range and blood pressure goal. I have advised patient to check BP  regularly and to call us back or report to clinic if the numbers are consistently higher than 140/90. We discussed the importance of compliance with medical therapy and DASH diet recommended, consequences of uncontrolled hypertension discussed.  - Comprehensive metabolic panel - amLODipine (NORVASC) 10 MG tablet; Take 1 tablet (10 mg total) by mouth daily. Must have office visit for refills  Dispense: 90 tablet; Refill: 1 - carvedilol (COREG) 25 MG tablet; Take 1 tablet (25 mg total) by mouth 2 (two) times daily.  Dispense: 180 tablet; Refill: 3  3. Iron deficiency anemia, unspecified iron deficiency anemia type - CBC with Differential/Platelet  4. Mixed hyperlipidemia - atorvastatin (LIPITOR) 20 MG tablet; Take 1.5 tablets (30 mg total) by mouth daily.  Dispense: 135 tablet; Refill: 3 - Lipid panel  5. Poor compliance Compliance and not "spacing out meds" discussed -take as directed    Patient have been counseled extensively about nutrition and exercise. Other issues discussed during this visit include: low cholesterol diet, weight control and daily exercise, foot care, annual eye examinations at Ophthalmology, importance of adherence with medications and regular follow-up. We also discussed long term complications of uncontrolled diabetes and hypertension.   Return in about 3 months (around 05/30/2021) for see PCP for chronic conditions.  The patient was given clear instructions to go to ER or return to medical center if symptoms don't improve, worsen or new problems develop. The patient verbalized understanding. The patient was told to call to get lab results if they haven't heard anything in the next week.      Freeman Caldron, PA-C [facility] and [place], [place]   03/02/2021, 3:17 PM

## 2021-03-03 LAB — LIPID PANEL
Chol/HDL Ratio: 3 ratio (ref 0.0–4.4)
Cholesterol, Total: 173 mg/dL (ref 100–199)
HDL: 58 mg/dL (ref >39)
LDL Chol Calc (NIH): 96 mg/dL (ref 0–99)
Triglycerides: 105 mg/dL (ref 0–149)
VLDL Cholesterol Cal: 19 mg/dL (ref 5–40)

## 2021-03-03 LAB — COMPREHENSIVE METABOLIC PANEL
ALT: 8 IU/L (ref 0–32)
AST: 21 IU/L (ref 0–40)
Albumin/Globulin Ratio: 1.5 (ref 1.2–2.2)
Albumin: 4.2 g/dL (ref 3.8–4.8)
Alkaline Phosphatase: 63 IU/L (ref 44–121)
BUN/Creatinine Ratio: 16 (ref 12–28)
BUN: 16 mg/dL (ref 8–27)
Bilirubin Total: <0.2 mg/dL (ref 0.0–1.2)
CO2: 26 mmol/L (ref 20–29)
Calcium: 9 mg/dL (ref 8.7–10.3)
Chloride: 98 mmol/L (ref 96–106)
Creatinine, Ser: 0.99 mg/dL (ref 0.57–1.00)
Globulin, Total: 2.8 g/dL (ref 1.5–4.5)
Glucose: 66 mg/dL — ABNORMAL LOW (ref 70–99)
Potassium: 3.9 mmol/L (ref 3.5–5.2)
Sodium: 139 mmol/L (ref 134–144)
Total Protein: 7 g/dL (ref 6.0–8.5)
eGFR: 64 mL/min/{1.73_m2} (ref >59)

## 2021-03-03 LAB — CBC WITH DIFFERENTIAL/PLATELET
Basophils Absolute: 0 10*3/uL (ref 0.0–0.2)
Basos: 1 %
EOS (ABSOLUTE): 0.1 10*3/uL (ref 0.0–0.4)
Eos: 1 %
Hematocrit: 37.2 % (ref 34.0–46.6)
Hemoglobin: 12.4 g/dL (ref 11.1–15.9)
Immature Grans (Abs): 0 10*3/uL (ref 0.0–0.1)
Immature Granulocytes: 0 %
Lymphocytes Absolute: 2.3 10*3/uL (ref 0.7–3.1)
Lymphs: 49 %
MCH: 31.3 pg (ref 26.6–33.0)
MCHC: 33.3 g/dL (ref 31.5–35.7)
MCV: 94 fL (ref 79–97)
Monocytes Absolute: 0.4 10*3/uL (ref 0.1–0.9)
Monocytes: 8 %
Neutrophils Absolute: 1.9 10*3/uL (ref 1.4–7.0)
Neutrophils: 41 %
Platelets: 227 10*3/uL (ref 150–450)
RBC: 3.96 x10E6/uL (ref 3.77–5.28)
RDW: 13 % (ref 11.7–15.4)
WBC: 4.6 10*3/uL (ref 3.4–10.8)

## 2021-03-08 ENCOUNTER — Ambulatory Visit: Payer: Medicaid Other | Admitting: Pharmacist

## 2021-03-16 ENCOUNTER — Other Ambulatory Visit: Payer: Self-pay

## 2021-03-16 ENCOUNTER — Ambulatory Visit: Payer: Medicaid Other | Attending: Internal Medicine | Admitting: Pharmacist

## 2021-03-16 VITALS — BP 148/97 | HR 63

## 2021-03-16 DIAGNOSIS — I10 Essential (primary) hypertension: Secondary | ICD-10-CM

## 2021-03-16 NOTE — Progress Notes (Signed)
° °  S:    PCP: Dr. Wynetta Emery  Patient arrives in good spirits. Presents to the clinic for hypertension evaluation, counseling, and management. Patient was referred and last seen by Freeman Caldron on 03/02/2021. BP at that visit showed improved control but was still above goal.   Medication adherence reported. She tells me her medication adherence has improved since condensing her individual losartan and HCTZ to combination Hyzaar. She is compliant with this, amlodipine, and carvedilol.   No chest pain, dyspnea, HA, blurred vision.   Current BP Medications include:  amlodipine 10 mg daily, carvedilol 25 mg BID, losartan-HCTZ 100-25 mg daily   Dietary habits include: non-compliant with salt restriction. Drinks coffee in the mornings. Works in [employer]. Since last visit, pt tells me she has increased her salad intake.  Exercise habits include: none  Family / Social history:  Fhx: HLD, HTN, heart disease  Tobacco: 0.5 PPD smoker  Alcohol: 1 drink standard per today   O:  Vitals:   03/16/21 1422  BP: (!) 148/97  Pulse: 63    Home BP readings: none  Last 3 Office BP readings: BP Readings from Last 3 Encounters:  03/16/21 (!) 148/97  03/02/21 (!) 151/97  02/02/21 (!) 210/117    BMET    Component Value Date/Time   NA 139 03/02/2021 1523   K 3.9 03/02/2021 1523   CL 98 03/02/2021 1523   CO2 26 03/02/2021 1523   GLUCOSE 66 (L) 03/02/2021 1523   GLUCOSE 88 07/13/2019 0740   BUN 16 03/02/2021 1523   CREATININE 0.99 03/02/2021 1523   CALCIUM 9.0 03/02/2021 1523   GFRNONAA 68 05/11/2019 1534   GFRAA 79 05/11/2019 1534    Renal function: CrCl cannot be calculated (Unknown ideal weight.).  Clinical ASCVD: No  The 10-year ASCVD risk score (Arnett DK, et al., 2019) is: 19.2%   Values used to calculate the score:     Age: 61 years     Sex: Female     Is Non-Hispanic African American: Yes     Diabetic: No     Tobacco smoker: Yes     Systolic Blood Pressure: 638 mmHg     Is BP  treated: Yes     HDL Cholesterol: 58 mg/dL     Total Cholesterol: 173 mg/dL   A/P: Hypertension diagnosed currently above goal but drastically improved since January. BP Goal = < 130/80 mmHg. Medication adherence reported.  -Continued current medication regimen given improvement. Pt would like to improve her lifestyle before adding any additional medications.  -We can consider spironolactone in the future should she need any additional lowering.  -Counseled on lifestyle modifications for blood pressure control including reduced dietary sodium, increased exercise, adequate sleep.  Results reviewed and written information provided.   Total time in face-to-face counseling 30 minutes.   F/U Clinic Visit in 1 month with PCP.   Brooke Chapman, PharmD, Brooke Chapman, Brooke Chapman [phone]

## 2021-05-11 ENCOUNTER — Ambulatory Visit: Payer: Medicaid Other | Attending: Physician Assistant | Admitting: Physician Assistant

## 2021-05-11 ENCOUNTER — Encounter: Payer: Self-pay | Admitting: Physician Assistant

## 2021-05-11 VITALS — BP 203/126 | HR 67 | Wt 135.0 lb

## 2021-05-11 DIAGNOSIS — Q828 Other specified congenital malformations of skin: Secondary | ICD-10-CM | POA: Diagnosis not present

## 2021-05-11 DIAGNOSIS — F17200 Nicotine dependence, unspecified, uncomplicated: Secondary | ICD-10-CM | POA: Diagnosis not present

## 2021-05-11 DIAGNOSIS — E876 Hypokalemia: Secondary | ICD-10-CM

## 2021-05-11 DIAGNOSIS — Z91199 Patient's noncompliance with other medical treatment and regimen due to unspecified reason: Secondary | ICD-10-CM | POA: Diagnosis not present

## 2021-05-11 DIAGNOSIS — M79671 Pain in right foot: Secondary | ICD-10-CM | POA: Diagnosis not present

## 2021-05-11 DIAGNOSIS — I739 Peripheral vascular disease, unspecified: Secondary | ICD-10-CM | POA: Diagnosis not present

## 2021-05-11 DIAGNOSIS — E782 Mixed hyperlipidemia: Secondary | ICD-10-CM

## 2021-05-11 DIAGNOSIS — I10 Essential (primary) hypertension: Secondary | ICD-10-CM

## 2021-05-11 MED ORDER — LOSARTAN POTASSIUM-HCTZ 100-25 MG PO TABS: 1.0000 | ORAL_TABLET | Freq: Every day | ORAL | 1 refills | Status: DC

## 2021-05-11 MED ORDER — CARVEDILOL 25 MG PO TABS: 25.0000 mg | ORAL_TABLET | Freq: Two times a day (BID) | ORAL | 3 refills | Status: DC

## 2021-05-11 MED ORDER — POTASSIUM CHLORIDE ER 8 MEQ PO TBCR: 16.0000 meq | EXTENDED_RELEASE_TABLET | Freq: Every day | ORAL | 1 refills | Status: DC

## 2021-05-11 MED ORDER — CLONIDINE HCL 0.1 MG PO TABS
0.1000 mg | ORAL_TABLET | Freq: Once | ORAL | Status: AC
  Administered 2021-05-11: 0.1 mg via ORAL

## 2021-05-11 MED ORDER — ATORVASTATIN CALCIUM 20 MG PO TABS: 30.0000 mg | ORAL_TABLET | Freq: Every day | ORAL | 3 refills | Status: DC

## 2021-05-11 MED ORDER — AMLODIPINE BESYLATE 10 MG PO TABS: 10.0000 mg | ORAL_TABLET | Freq: Every day | ORAL | 1 refills | Status: DC

## 2021-05-11 NOTE — Patient Instructions (Signed)
Check blood pressures daily and record.  Avoid cigarettes and alcohol.  Take your medications at the same time every day.  Drink 80-100 ounces water daily ?

## 2021-05-11 NOTE — Progress Notes (Signed)
Patient ID: Brooke Chapman, female   DOB: 12/17/1960, 61 y.o.   MRN: [id number] ? ? ? ?Brooke Chapman, is a 61 y.o. female ? ?[id number] ? ?[id number] ? ?DOB - Apr 09, 1960 ? ?Chief Complaint  ?Patient presents with  ? Hypertension  ? feet pain  ?    ? ?Subjective:  ? ?Brooke Chapman is a 61 y.o. female here today for a follow up visit for blood pressure.  She did not take any of her medications this morning.  She smokes.  She admits to "some" alcohol on a regular basis.  Says she is compliant with meds.  She says she checks her BP at home but never keeps a log or writes them down.  The only one she can tell me is  ?189/90s. ? ?She is complaining of continued foot problem.  She says she is doing the urea cream and salicylic acid as recommended by podiatry.   ? ?She is asking if she can be on disability due to health issues and foot pain ? ?She has not followed up with cardiology like she is supposed to.  She stopped taking Pletal bc she did not feel like it was helping. ? ?Patient has No headache, No chest pain, No abdominal pain - No Nausea, No new weakness tingling or numbness, No Cough - SOB. ? ?No problems updated. ? ?ALLERGIES: ?Allergies  ?Allergen Reactions  ? Latex Rash  ? ? ?PAST MEDICAL HISTORY: ?Past Medical History:  ?Diagnosis Date  ? Anemia   ? Essential hypertension   ? HTN (hypertension) 05/01/2018  ? Hyperlipidemia   ? Hypertension   ? PAD (peripheral artery disease) ([place])   ? PVD (peripheral vascular disease) ([place]) 08/26/2018  ? ? ?MEDICATIONS AT HOME: ?Prior to Admission medications   ?Medication Sig Start Date End Date Taking? Authorizing Provider  ?amLODipine (NORVASC) 10 MG tablet Take 1 tablet (10 mg total) by mouth daily. 05/11/21   Argentina Donovan, PA-C  ?aspirin 81 MG chewable tablet Chew 1 tablet (81 mg total) by mouth daily. 11/09/20   Argentina Donovan, PA-C  ?atorvastatin (LIPITOR) 20 MG tablet Take 1.5 tablets (30 mg total) by mouth daily. 05/11/21   Argentina Donovan, PA-C  ?Blood Pressure  Monitor DEVI Use as directed to check home blood pressure 2-3 times a week 07/28/18   Ladell Pier, MD  ?carvedilol (COREG) 25 MG tablet Take 1 tablet (25 mg total) by mouth 2 (two) times daily. 05/11/21 08/09/21  Argentina Donovan, PA-C  ?cholecalciferol (VITAMIN D3) 25 MCG (1000 UT) tablet Take 1,000 Units by mouth daily.    [provider]  ?cilostazol (PLETAL) 100 MG tablet Take 1 tablet (100 mg total) by mouth 2 (two) times daily. 11/09/20   Argentina Donovan, PA-C  ?ferrous sulfate 325 (65 FE) MG tablet Take 1 tablet (325 mg total) by mouth 2 (two) times daily with a meal. 12/01/19   Ladell Pier, MD  ?losartan-hydrochlorothiazide (HYZAAR) 100-25 MG tablet Take 1 tablet by mouth daily. 05/11/21   Argentina Donovan, PA-C  ?potassium chloride (KLOR-CON) 8 MEQ tablet Take 2 tablets (16 mEq total) by mouth daily. 05/11/21   Argentina Donovan, PA-C  ? ? ?ROS: ?Neg HEENT ?Neg resp ?Neg cardiac ?Neg GI ?Neg GU ?Neg MS ?Neg psych ?Neg neuro ? ?Objective:  ? ?Vitals:  ? 05/11/21 0903  ?BP: (!) 200/100  ?Pulse: 67  ?SpO2: 100%  ?Weight: 135 lb (61.2 kg)  ? ?Exam ?General appearance : Awake, alert,  not in any distress. Speech Clear. Not toxic looking ?HEENT: Atraumatic and Normocephalic ?Neck: Supple, no JVD. No cervical lymphadenopathy.  ?Chest: Good air entry bilaterally, CTAB.  No rales/rhonchi/wheezing ?CVS: S1 S2 regular, no murmurs.  ?Extremities: B/L Lower Ext shows no edema, both legs are warm to touch ?Neurology: Awake alert, and oriented X 3, CN II-XII intact, Non focal ?Skin: No Rash ? ?Data Review ?Lab Results  ?Component Value Date  ? HGBA1C 5.3 (A) 04/29/2020  ? HGBA1C 5.8 (H) 11/26/2019  ? ? ?Assessment & Plan  ? ?1. Essential hypertension ?Uncontrolled-questionable compliance and unsure why she did not take her meds before coming to the appt(this tends to be a pattern.)Check blood pressures daily and record.  Avoid cigarettes and alcohol.  Take your medications at the same time every day.   Drink 80-100 ounces water daily ? We have discussed target BP range and blood pressure goal. I have advised patient to check BP regularly and to call us back or report to clinic if the numbers are consistently higher than 140/90. We discussed the importance of compliance with medical therapy and DASH diet recommended, consequences of uncontrolled hypertension discussed.   ?- amLODipine (NORVASC) 10 MG tablet; Take 1 tablet (10 mg total) by mouth daily.  Dispense: 90 tablet; Refill: 1 ?- carvedilol (COREG) 25 MG tablet; Take 1 tablet (25 mg total) by mouth 2 (two) times daily.  Dispense: 180 tablet; Refill: 3 ?- losartan-hydrochlorothiazide (HYZAAR) 100-25 MG tablet; Take 1 tablet by mouth daily.  Dispense: 90 tablet; Refill: 1 ?- cloNIDine (CATAPRES) tablet 0.1 mg ? ?2. Mixed hyperlipidemia ?- atorvastatin (LIPITOR) 20 MG tablet; Take 1.5 tablets (30 mg total) by mouth daily.  Dispense: 135 tablet; Refill: 3 ? ?3. Hypokalemia ?- potassium chloride (KLOR-CON) 8 MEQ tablet; Take 2 tablets (16 mEq total) by mouth daily.  Dispense: 180 tablet; Refill: 1 ? ?4. Poor compliance ?Compliance imperative.  She reports compliance but when questioned more it is clear  that she "misses" doses or doesn't take all meds daily.  She has not kept a BP journal tho recommended multiple times.  Risks of MI/Stroke/paralysis and even death discussed with uncontrolled htn. ? ?5. Porokeratosis ?Maybe weight displacement orthotics or padding would help ?- Ambulatory referral to Podiatry ? ?6. Foot pain, right ?See 5 ? ?7. Smoker ?Smoking and dangers of nicotine have been discussed at length. Long term health consequences of smoking reviewed in detail.  Methods for helping with cessation have been reviewed.  Patient expresses understanding. ? ? ?8. PAD (peripheral artery disease) ([place]) ?- Ambulatory referral to Cardiology ? ?Reviewed labs from about 8 weeks ago-CBC/lipids/and CMP unremarkable ? ?Patient have been counseled extensively about  nutrition and exercise. Other issues discussed during this visit include: low cholesterol diet, weight control and daily exercise, foot care, annual eye examinations at Ophthalmology, importance of adherence with medications and regular follow-up. We also discussed long term complications of uncontrolled diabetes and hypertension.  ? ?Return for [facility] in 3-4 weeks.  PCP in 3 months. ? ?The patient was given clear instructions to go to ER or return to medical center if symptoms don't improve, worsen or new problems develop. The patient verbalized understanding. The patient was told to call to get lab results if they haven't heard anything in the next week.  ? ? ? ? ?Freeman Caldron, PA-C ?[place] ?[place], [place] ?[phone]   ?05/11/2021, 9:22 AM  ?

## 2021-05-24 ENCOUNTER — Telehealth: Payer: Self-pay | Admitting: Pharmacist

## 2021-05-24 DIAGNOSIS — I10 Essential (primary) hypertension: Secondary | ICD-10-CM

## 2021-05-24 NOTE — Telephone Encounter (Signed)
Patient appearing on report for [employer] - Hypertension Control report due to last documented ambulatory blood pressure of 203/126 on 05/11/21. Next appointment with PCP is 08/25/21. Appointment with embedded PharmD on 06/02/21 and cardiology on 06/15/21 ? ?Outreached patient to discuss hypertension control and medication management.  ? ?Current antihypertensives: carvedilol 25 mg twice daily, amlodipine 10 mg daily, losartan/HCTZ 100/25 mg daily  ? ?Reports that she often "is too lazy" to get up and take evening medications once she gets into bed. Typically awake 4 am - 8 pm. Works at [employer]. ? ?Patient has an automated upper arm home BP machine. It has been calibrated and confirmed as accurate  ? ?Current blood pressure readings: 160s/110s per patient report  ? ?Current meal patterns: does use sea salt but uses mostly no-sodium seasoning.  ? ?Current physical activity: limited by claudication pain, but more so by calluses that impact mobility. Scheduled to see podiatry tomorrow.  ? ?Patient denies hypotensive signs and symptoms including dizziness, lightheadedness.  ?Patient reports hypertensive symptoms including headache, chest pain, shortness of breath. ? ?Patient denies side effects related to any antihypertensives   ? ?Does report that she is not taking cilostazol because she does not feel any benefit.  ? ?Assessment/Plan: ?- Currently uncontrolled ?- - Reviewed goal blood pressure <130/80 ?- Reviewed appropriate administration of medication regimen ?- Counseled on long term microvascular and macrovascular complications of uncontrolled hypertension ?- Reviewed appropriate home BP monitoring technique (avoid caffeine, smoking, and exercise for 30 minutes before checking, rest for at least 5 minutes before taking BP, sit with feet flat on the floor and back against a hard surface, uncross legs, and rest arm on flat surface) ?- Reviewed to check blood pressure daily, document, and provide at next provider  visit ?- Reviewed strategies to improve medication adherence such as using an AM pill box and a PM pill box that she keeps by her beside ?- Follow up with embedded pharmacist as scheduled. Consider change to more potent ARB ? ?Also discussed lipid control for secondary prevention in PAD. Recommend increasing atorvastatin to 40 mg daily. Will discuss with PCP prior to next appointment.  ? ?Follow up call in ~ 4 weeks ? ?[name], PharmD, BCACP ?[place] ?[phone] ? ? ? ?

## 2021-05-24 NOTE — Telephone Encounter (Signed)
Noted, thanks for all you do! ?

## 2021-05-26 ENCOUNTER — Encounter: Payer: Self-pay | Admitting: Podiatry

## 2021-05-26 ENCOUNTER — Ambulatory Visit: Payer: Medicaid Other | Admitting: Podiatry

## 2021-05-26 DIAGNOSIS — L0889 Other specified local infections of the skin and subcutaneous tissue: Secondary | ICD-10-CM | POA: Diagnosis not present

## 2021-05-26 DIAGNOSIS — L989 Disorder of the skin and subcutaneous tissue, unspecified: Secondary | ICD-10-CM | POA: Insufficient documentation

## 2021-05-26 DIAGNOSIS — I739 Peripheral vascular disease, unspecified: Secondary | ICD-10-CM

## 2021-05-26 DIAGNOSIS — D689 Coagulation defect, unspecified: Secondary | ICD-10-CM

## 2021-05-26 DIAGNOSIS — Q828 Other specified congenital malformations of skin: Secondary | ICD-10-CM

## 2021-05-26 HISTORY — DX: Disorder of the skin and subcutaneous tissue, unspecified: L98.9

## 2021-05-26 NOTE — Progress Notes (Addendum)
This patient returns to my office for at risk foot care.  This patient requires this care by a professional since this patient will be at risk due to having PVD and PAD.  Patient states her callus are very painful to walk.  She has not been seen in over 9 months.  This patient presents to the office to discuss further treatment of the callus.    This patient presents for at risk foot care today. ? ?General Appearance  Alert, conversant and in no acute stress. ? ?Vascular  Dorsalis pedis and posterior tibial  pulses are  weakly palpable  bilaterally.  Capillary return is within normal limits  bilaterally. Temperature is within normal limits  bilaterally. ? ?Neurologic  Senn-Weinstein monofilament wire test within normal limits  bilaterally. Muscle power within normal limits bilaterally. ? ?Nails Thick disfigured discolored nails with subungual debris  from hallux to fifth toes bilaterally. No evidence of bacterial infection or drainage bilaterally. ? ?Orthopedic  No limitations of motion  feet .  No crepitus or effusions noted.  No bony pathology or digital deformities noted.  HAV  B/L. ? ?Skin  normotropic skin noted bilaterally.  No signs of infections or ulcers noted.   Callus sub 5th  B/L.  Callus right heel.  Callus left hell lateral aspect. ? ?Porokeratosis  B/L.  Callus  B/L. ? ?Consent was obtained for treatment procedures.   Debridement of callus with # 15 blade.  Upon finishing  I suggested  she make an appointment with one of the surgical podiatrists for future surgery including removal of right heel skin lesion. Also told her to return more frequently. For treatment. ? ? ?Return office visit    9 weeks.                 Told patient to return for periodic foot care and evaluation due to potential at risk complications. ? ? ?Gardiner Barefoot DPM   ?

## 2021-06-02 ENCOUNTER — Ambulatory Visit: Payer: Medicaid Other | Attending: Internal Medicine | Admitting: Pharmacist

## 2021-06-02 ENCOUNTER — Encounter: Payer: Self-pay | Admitting: Pharmacist

## 2021-06-02 VITALS — BP 126/82

## 2021-06-02 DIAGNOSIS — I10 Essential (primary) hypertension: Secondary | ICD-10-CM | POA: Diagnosis not present

## 2021-06-02 NOTE — Progress Notes (Signed)
? ?S:    ?PCP: Dr. Wynetta Emery ? ?Patient arrives in good spirits. Presents to the clinic for hypertension , counseling, and management. Patient was referred and last seen by Freeman Caldron on 05/11/2021. BP at that visit was very high, however, pt had not taken her medications prior to that visit.  ? ?Today, she reports medication adherence. She took all of her antihypertensives prior to this appointment.  ? ?No chest pain, dyspnea, HA, blurred vision.  ? ?Current BP Medications include:  amlodipine 10 mg daily, carvedilol 25 mg BID, losartan-HCTZ 100-25 mg daily  ? ?Dietary habits include: non-compliant with salt restriction. Drinks coffee in the mornings. Works in [employer]. Since last visit, pt tells me she has increased her salad intake.  ?Exercise habits include: none  ?Family / Social history:  ?Fhx: HLD, HTN, heart disease  ?Tobacco: 0.5 PPD smoker  ?Alcohol: 1 drink standard per today  ? ?O:  ?Vitals:  ? 06/02/21 1003  ?BP: 126/82  ? ?Home BP readings:  ?-Reports 180s/90s but takes these first thing in the morning BEFORE she takes her antihypertensives. ? ?Last 3 Office BP readings: ?BP Readings from Last 3 Encounters:  ?06/02/21 126/82  ?05/11/21 (!) 203/126  ?03/16/21 (!) 148/97  ? ? ?BMET ?   ?Component Value Date/Time  ? NA 139 03/02/2021 1523  ? K 3.9 03/02/2021 1523  ? CL 98 03/02/2021 1523  ? CO2 26 03/02/2021 1523  ? GLUCOSE 66 (L) 03/02/2021 1523  ? GLUCOSE 88 07/13/2019 0740  ? BUN 16 03/02/2021 1523  ? CREATININE 0.99 03/02/2021 1523  ? CALCIUM 9.0 03/02/2021 1523  ? GFRNONAA 68 05/11/2019 1534  ? GFRAA 79 05/11/2019 1534  ? ? ?Renal function: ?CrCl cannot be calculated (Patient's most recent lab result is older than the maximum 21 days allowed.). ? ?Clinical ASCVD: No  ?The 10-year ASCVD risk score (Arnett DK, et al., 2019) is: 13% ?  Values used to calculate the score: ?    Age: 61 years ?    Sex: Female ?    Is Non-Hispanic African American: Yes ?    Diabetic: No ?    Tobacco smoker: Yes ?     Systolic Blood Pressure: 409 mmHg ?    Is BP treated: Yes ?    HDL Cholesterol: 58 mg/dL ?    Total Cholesterol: 173 mg/dL ? ? ?A/P: ?Hypertension diagnosed currently at goal and drastically improved since January. BP Goal = < 130/80 mmHg. Medication adherence reported.  ? ?I repeated her BP and got a similar result. I question her BP control overall. The last BP reading in clinic that was similar to today's reading was 127/86 mmHg taken in 12/2019. She tells me today that she took her BP medications before this visit and that she has not smoked a cigarette this morning. This could definitely help improve her BP control, but I'm worried that today's result is isolated. I encouraged compliance. Will not make any changes at this time but I encouraged her to check her home BP AFTER taking her antihypertensives at home and record these. She will return in 1 month for reassessment.  ? ?-Continued current medication regimen given improvement.  ?-Counseled on lifestyle modifications for blood pressure control including reduced dietary sodium, increased exercise, adequate sleep. ? ?Results reviewed and written information provided.   Total time in face-to-face counseling 30 minutes.   ?F/U Clinic Visit in 1 month with PCP.  ? ?Benard Halsted, PharmD, BCACP, CPP ?Clinical Pharmacist ?[employer]  [place] ?[phone] ? ? ? ? ? ?

## 2021-06-03 IMAGING — MG DIGITAL SCREENING BILAT W/ TOMO W/ CAD
8 series · 9 of 24 positions shown · non-contrast
Comparison: Previous exam(s).

CLINICAL DATA: Screening.

EXAM:
DIGITAL SCREENING BILATERAL MAMMOGRAM WITH TOMO AND CAD

[R MLO synth-2D]
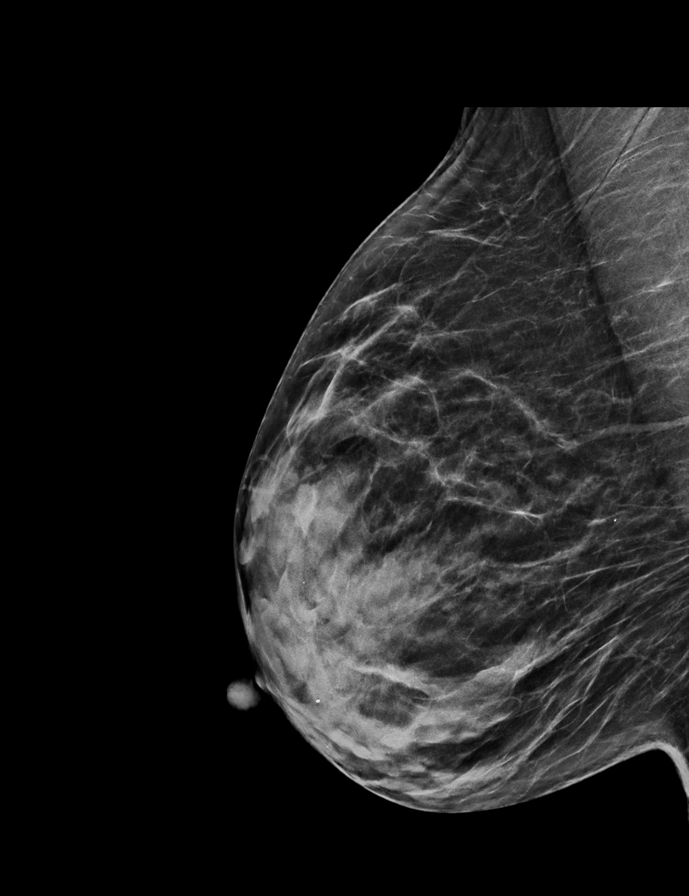

[L CC synth-2D]
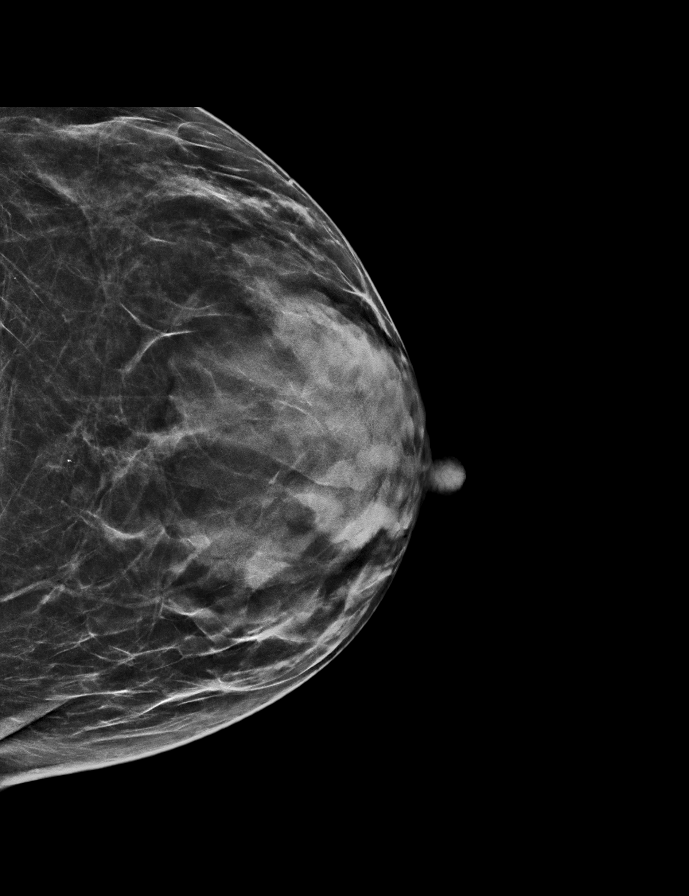

[L MLO synth-2D]
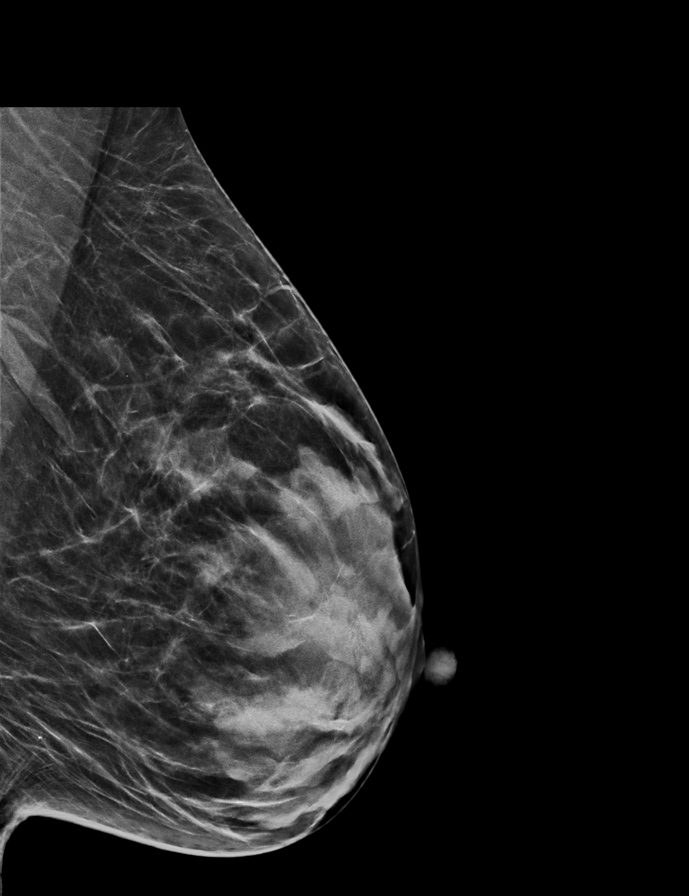

[R CC synth-2D]
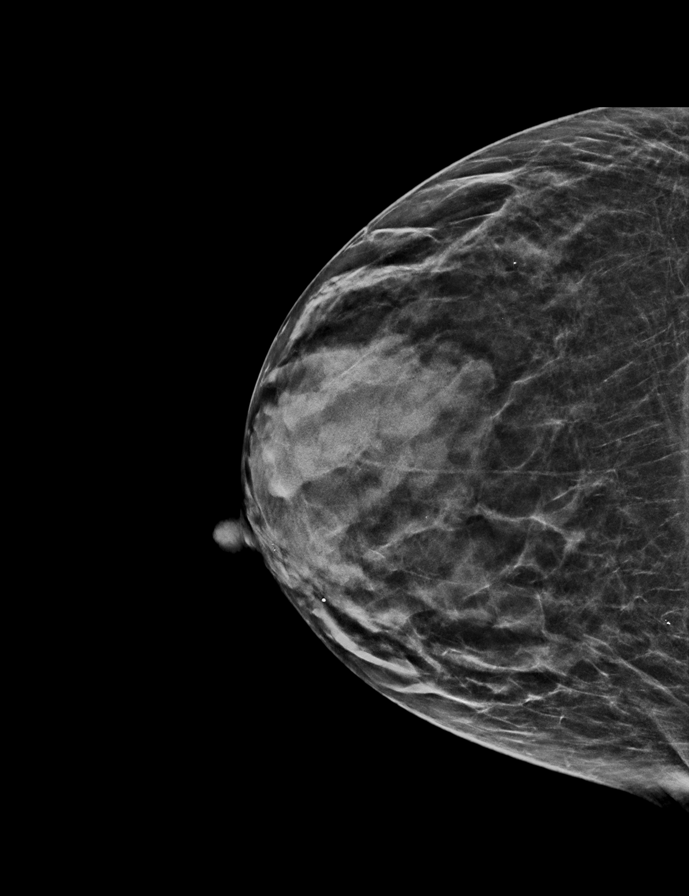

[R CC tomo · 2 of 48 frames shown]
[frame 16/48]
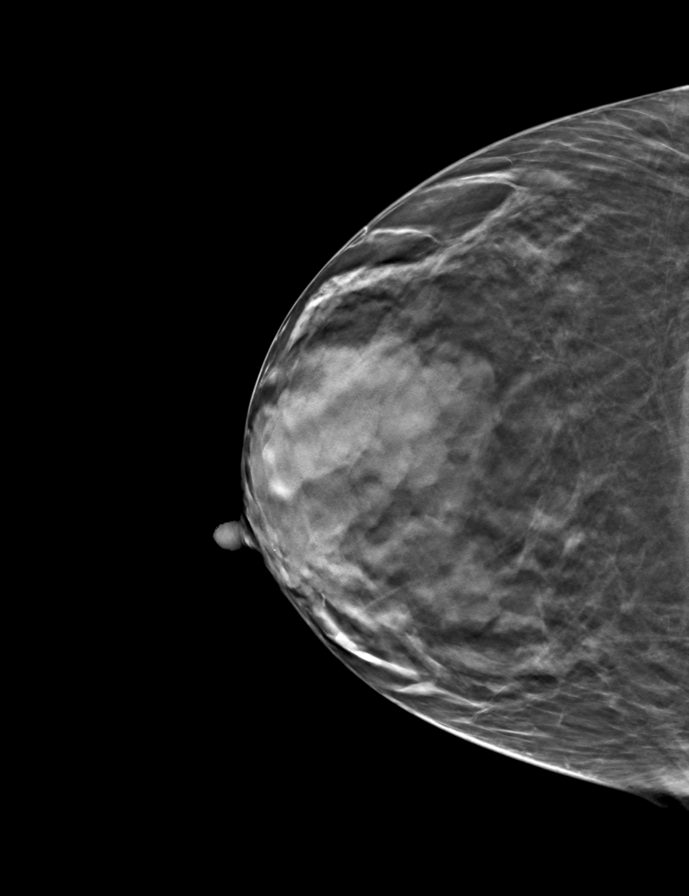
[frame 25/48]
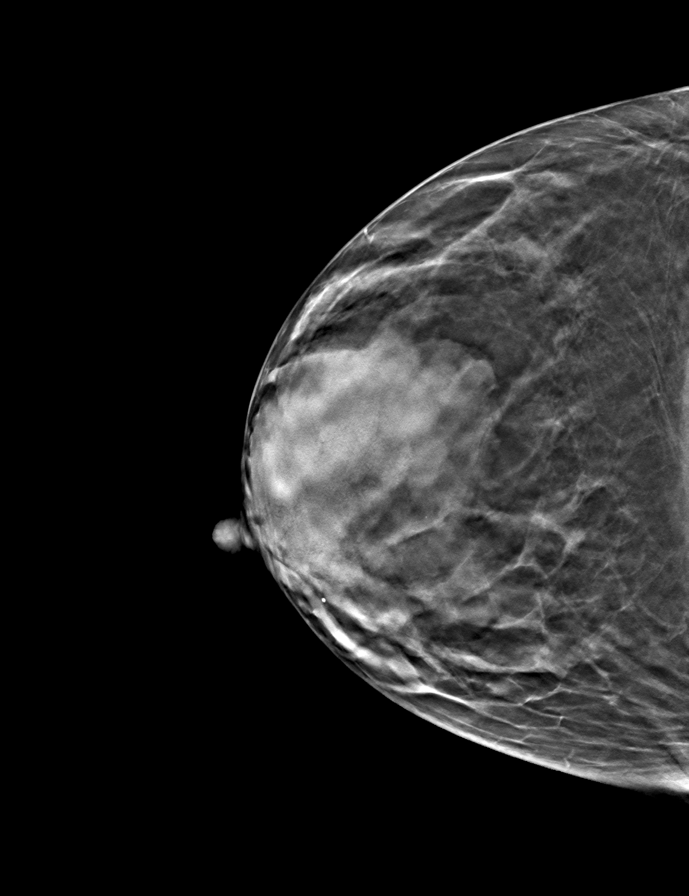

[L MLO tomo · tomo slice 27/52.0]
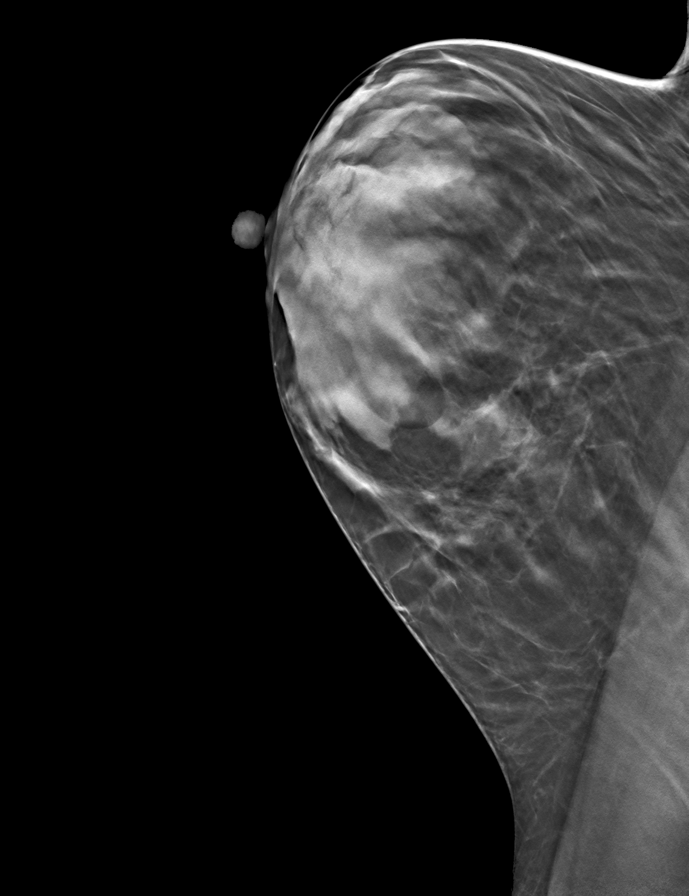

[L CC tomo · tomo slice 25/50.0]
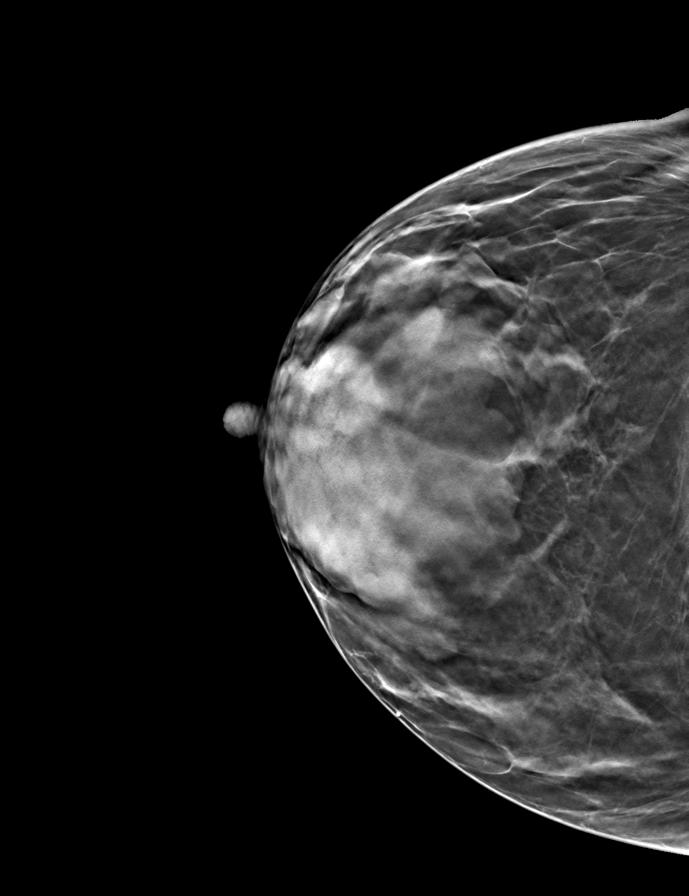

[R MLO tomo · tomo slice 26/51.0]
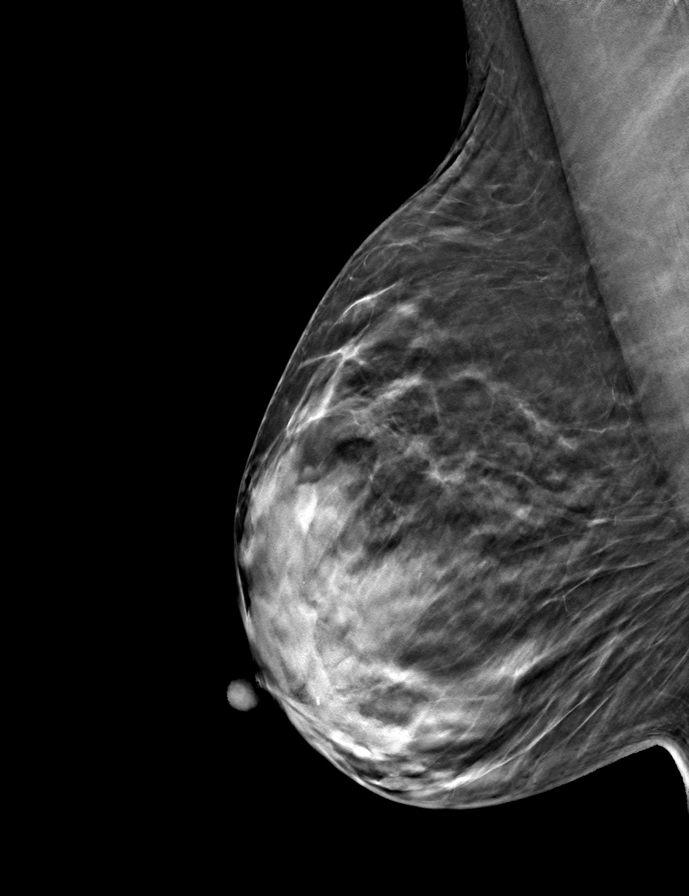

[9 of 24 positions shown; findings below may reference images not displayed]

ACR Breast Density Category c: The breast tissue is heterogeneously
dense, which may obscure small masses.
FINDINGS: There are no findings suspicious for malignancy. Images were
processed with CAD.
IMPRESSION: No mammographic evidence of malignancy. A result letter of this
screening mammogram will be mailed directly to the patient.

RECOMMENDATION:
Screening mammogram in one year. (Code:FT-U-LHB)

BI-RADS CATEGORY  1: Negative.

## 2021-06-13 DIAGNOSIS — D649 Anemia, unspecified: Secondary | ICD-10-CM | POA: Insufficient documentation

## 2021-06-15 ENCOUNTER — Encounter: Payer: Self-pay | Admitting: Cardiology

## 2021-06-15 ENCOUNTER — Ambulatory Visit: Payer: Medicaid Other | Admitting: Cardiology

## 2021-06-15 VITALS — BP 142/84 | HR 77 | Ht 64.0 in | Wt 137.0 lb

## 2021-06-15 DIAGNOSIS — I10 Essential (primary) hypertension: Secondary | ICD-10-CM | POA: Diagnosis not present

## 2021-06-15 DIAGNOSIS — E782 Mixed hyperlipidemia: Secondary | ICD-10-CM

## 2021-06-15 DIAGNOSIS — I739 Peripheral vascular disease, unspecified: Secondary | ICD-10-CM | POA: Diagnosis not present

## 2021-06-15 DIAGNOSIS — R011 Cardiac murmur, unspecified: Secondary | ICD-10-CM | POA: Diagnosis not present

## 2021-06-15 DIAGNOSIS — R079 Chest pain, unspecified: Secondary | ICD-10-CM | POA: Diagnosis not present

## 2021-06-15 DIAGNOSIS — F17200 Nicotine dependence, unspecified, uncomplicated: Secondary | ICD-10-CM

## 2021-06-15 DIAGNOSIS — I2510 Atherosclerotic heart disease of native coronary artery without angina pectoris: Secondary | ICD-10-CM | POA: Diagnosis not present

## 2021-06-15 MED ORDER — NITROGLYCERIN 0.4 MG SL SUBL: 0.4000 mg | SUBLINGUAL_TABLET | SUBLINGUAL | 6 refills | Status: DC | PRN

## 2021-06-15 NOTE — Patient Instructions (Signed)

## 2021-06-15 NOTE — Progress Notes (Signed)
Cardiology Office Note:    Date:  06/15/2021   ID:  Brooke Chapman, DOB 05-Dec-1960, MRN [id number]  PCP:  Ladell Pier, MD  Cardiologist:  Jenean Lindau, MD   Referring MD: Argentina Donovan, PA-C    ASSESSMENT:    1. Essential hypertension   2. Coronary artery disease involving native coronary artery of native heart without angina pectoris   3. Mixed hyperlipidemia   4. PAD (peripheral artery disease) ([place])   5. Tobacco dependence   6. Chest pain of uncertain etiology   7. Cardiac murmur    PLAN:    In order of problems listed above:  Secondary prevention stressed with the patient.  Importance of compliance with diet medication stressed any vocalized understanding. Chest pain: Has happened in the past when she has had elevated blood pressures.  Currently chest pain-free and feeling better.  In view of this we will do a Lexiscan sestamibi because of established coronary artery disease on CT scan.  Sublingual nitroglycerin prescription was sent, its protocol and 911 protocol explained and the patient vocalized understanding questions were answered to the patient's satisfaction. Cigarette smoker: I spent 5 minutes with the patient discussing solely about smoking. Smoking cessation was counseled. I suggested to the patient also different medications and pharmacological interventions. Patient is keen to try stopping on its own at this time. He will get back to me if he needs any further assistance in this matter. Essential hypertension: Blood pressure stable and diet was emphasized.  Lifestyle modification urged. Cardiac murmur: Echocardiogram will be done to assess murmur heard on auscultation. Mixed dyslipidemia: Lipids were reviewed and followed by primary care.  Goal LDL needs to be less than 60.  She understands.  Diet emphasized. Patient will be seen in follow-up appointment in 6 months or earlier if the patient has any concerns    Medication Adjustments/Labs and Tests  Ordered: Current medicines are reviewed at length with the patient today.  Concerns regarding medicines are outlined above.  No orders of the defined types were placed in this encounter.  No orders of the defined types were placed in this encounter.    History of Present Illness:    Brooke Chapman is a 61 y.o. female who is being seen today for the evaluation of coronary artery disease which is extensive calcification found on CT scan at the request of Argentina Donovan, PA-C.  Patient is a pleasant 61 year old female.  She has past medical history of essential hypertension, mixed dyslipidemia, peripheral arterial disease post stenting and unfortunately continues to smoke.  She leads a sedentary lifestyle.  She works in Copy at [employer].  She does not do aerobic exercises.  She mentions to me that she has chest tightness at times when her blood pressure was elevated.  Currently she is chest pain-free.  At the time of my evaluation, the patient is alert awake oriented and in no distress.  Past Medical History:  Diagnosis Date   Anemia    Antiplatelet or antithrombotic long-term use 01/13/2020   Black stools 01/13/2020   Essential hypertension    Frontal balding 12/28/2019   GAD (generalized anxiety disorder) 05/01/2020   HTN (hypertension) 05/01/2018   Hyperlipidemia    Hypertension    Iron deficiency anemia 12/28/2019   PAD (peripheral artery disease) (HCC)    Porokeratosis 08/26/2018   PVD (peripheral vascular disease) ([place]) 08/26/2018   Skin lesion 05/26/2021   Tobacco dependence 05/01/2018   Unintended weight loss  01/13/2020    Past Surgical History:  Procedure Laterality Date   ABDOMINAL AORTOGRAM W/LOWER EXTREMITY Bilateral 07/13/2019   Procedure: ABDOMINAL AORTOGRAM W/LOWER EXTREMITY;  Surgeon: Waynetta Sandy, MD;  Location: [facility];  Service: Cardiovascular;  Laterality: Bilateral;   BREAST EXCISIONAL BIOPSY Left    LESS THAN 5 YEARS AGO    PERIPHERAL VASCULAR INTERVENTION Left 07/13/2019   Procedure: PERIPHERAL VASCULAR INTERVENTION;  Surgeon: Waynetta Sandy, MD;  Location: [facility];  Service: Cardiovascular;  Laterality: Left;  common iliac    Current Medications: Current Meds  Medication Sig   amLODipine (NORVASC) 10 MG tablet Take 1 tablet (10 mg total) by mouth daily.   aspirin 81 MG chewable tablet Chew 1 tablet (81 mg total) by mouth daily.   atorvastatin (LIPITOR) 20 MG tablet Take 1.5 tablets (30 mg total) by mouth daily.   Blood Pressure Monitor DEVI Use as directed to check home blood pressure 2-3 times a week   carvedilol (COREG) 25 MG tablet Take 1 tablet (25 mg total) by mouth 2 (two) times daily.   cholecalciferol (VITAMIN D3) 25 MCG (1000 UT) tablet Take 1,000 Units by mouth daily.   cilostazol (PLETAL) 100 MG tablet Take 1 tablet (100 mg total) by mouth 2 (two) times daily.   ferrous sulfate 325 (65 FE) MG tablet Take 1 tablet (325 mg total) by mouth 2 (two) times daily with a meal.   losartan-hydrochlorothiazide (HYZAAR) 100-25 MG tablet Take 1 tablet by mouth daily.   potassium chloride (KLOR-CON) 8 MEQ tablet Take 2 tablets (16 mEq total) by mouth daily.   Current Facility-Administered Medications for the 06/15/21 encounter (Office Visit) with Sheena Donegan, Reita Cliche, MD  Medication   cloNIDine (CATAPRES) tablet 0.1 mg     Allergies:   Latex   Social History   Socioeconomic History   Marital status: Single    Spouse name: Not on file   Number of children: 0   Years of education: Not on file   Highest education level: Not on file  Occupational History   Occupation: Prep cook  Tobacco Use   Smoking status: Every Day    Packs/day: 0.50    Types: Cigarettes   Smokeless tobacco: Never   Tobacco comments:    6-10 cigs daily  Vaping Use   Vaping Use: Never used  Substance and Sexual Activity   Alcohol use: Yes    Comment: 1 per day   Drug use: Never   Sexual activity: Not  Currently  Other Topics Concern   Not on file  Social History Narrative   Not on file   Social Determinants of Health   Financial Resource Strain: Not on file  Food Insecurity: Not on file  Transportation Needs: Not on file  Physical Activity: Not on file  Stress: Not on file  Social Connections: Not on file     Family History: The patient's family history includes Heart disease in her maternal aunt and mother; Hyperlipidemia in her maternal aunt and mother; Hypertension in her brother, maternal aunt, and mother. There is no history of Colon cancer, Liver cancer, Rectal cancer, or Stomach cancer.  ROS:   Please see the history of present illness.    All other systems reviewed and are negative.  EKGs/Labs/Other Studies Reviewed:    The following studies were reviewed today: I discussed my findings with the patient at length.  EKG reveals sinus rhythm and nonspecific ST changes   Recent Labs: 11/09/2020: TSH  1.210 03/02/2021: ALT 8; BUN 16; Creatinine, Ser 0.99; Hemoglobin 12.4; Platelets 227; Potassium 3.9; Sodium 139  Recent Lipid Panel    Component Value Date/Time   CHOL 173 03/02/2021 1523   TRIG 105 03/02/2021 1523   HDL 58 03/02/2021 1523   CHOLHDL 3.0 03/02/2021 1523   LDLCALC 96 03/02/2021 1523    Physical Exam:    VS:  BP (!) 142/84   Pulse 77   Ht '5\' 4"'$  (1.626 m)   Wt 137 lb (62.1 kg)   SpO2 99%   BMI 23.52 kg/m     Wt Readings from Last 3 Encounters:  06/15/21 137 lb (62.1 kg)  05/11/21 135 lb (61.2 kg)  03/02/21 138 lb 6.4 oz (62.8 kg)     GEN: Patient is in no acute distress HEENT: Normal NECK: No JVD; No carotid bruits LYMPHATICS: No lymphadenopathy CARDIAC: S1 S2 regular, 2/6 systolic murmur at the apex. RESPIRATORY:  Clear to auscultation without rales, wheezing or rhonchi  ABDOMEN: Soft, non-tender, non-distended MUSCULOSKELETAL:  No edema; No deformity  SKIN: Warm and dry NEUROLOGIC:  Alert and oriented x 3 PSYCHIATRIC:  Normal affect     Signed, Jenean Lindau, MD  06/15/2021 2:23 PM    [place]

## 2021-06-16 ENCOUNTER — Telehealth (HOSPITAL_COMMUNITY): Payer: Self-pay | Admitting: Radiology

## 2021-06-16 NOTE — Telephone Encounter (Signed)
Patient given detailed instructions per Myocardial Perfusion Study Information Sheet for the test on 06/23/2021 at 7:45. Patient notified to arrive 15 minutes early and that it is imperative to arrive on time for appointment to keep from having the test rescheduled.  If you need to cancel or reschedule your appointment, please call the office within 24 hours of your appointment. . Patient verbalized understanding.EHK

## 2021-06-19 ENCOUNTER — Encounter (HOSPITAL_COMMUNITY): Payer: Medicaid Other

## 2021-06-23 ENCOUNTER — Ambulatory Visit (HOSPITAL_COMMUNITY): Payer: Medicaid Other | Attending: Cardiovascular Disease

## 2021-06-23 DIAGNOSIS — I2510 Atherosclerotic heart disease of native coronary artery without angina pectoris: Secondary | ICD-10-CM | POA: Insufficient documentation

## 2021-06-23 DIAGNOSIS — R079 Chest pain, unspecified: Secondary | ICD-10-CM | POA: Insufficient documentation

## 2021-06-23 LAB — MYOCARDIAL PERFUSION IMAGING
LV dias vol: 88 mL (ref 46–106)
LV sys vol: 40 mL
Nuc Stress EF: 54 %
Peak HR: 105 {beats}/min
Rest HR: 73 {beats}/min
Rest Nuclear Isotope Dose: 9.7 mCi
SDS: 0
SRS: 0
SSS: 0
ST Depression (mm): 0 mm
Stress Nuclear Isotope Dose: 31.5 mCi
TID: 1.15

## 2021-06-23 MED ORDER — TECHNETIUM TC 99M TETROFOSMIN IV KIT
9.7000 | PACK | Freq: Once | INTRAVENOUS | Status: AC | PRN
  Administered 2021-06-23: 9.7 via INTRAVENOUS

## 2021-06-23 MED ORDER — AMINOPHYLLINE 25 MG/ML IV SOLN
75.0000 mg | Freq: Once | INTRAVENOUS | Status: AC
  Administered 2021-06-23: 75 mg via INTRAVENOUS

## 2021-06-23 MED ORDER — TECHNETIUM TC 99M TETROFOSMIN IV KIT
31.5000 | PACK | Freq: Once | INTRAVENOUS | Status: AC | PRN
  Administered 2021-06-23: 31.5 via INTRAVENOUS

## 2021-06-23 MED ORDER — REGADENOSON 0.4 MG/5ML IV SOLN
0.4000 mg | Freq: Once | INTRAVENOUS | Status: AC
  Administered 2021-06-23: 0.4 mg via INTRAVENOUS

## 2021-06-27 ENCOUNTER — Other Ambulatory Visit: Payer: Self-pay | Admitting: Pharmacist

## 2021-06-27 DIAGNOSIS — I10 Essential (primary) hypertension: Secondary | ICD-10-CM

## 2021-06-27 DIAGNOSIS — I739 Peripheral vascular disease, unspecified: Secondary | ICD-10-CM

## 2021-06-27 NOTE — Chronic Care Management (AMB) (Signed)
Chief Complaint  Patient presents with   Hypertension    Brooke Chapman is a 61 y.o. year old female who presented via a video visit.   They were referred to the pharmacist by a quality report for assistance in managing hypertension   Patient is participating in a Managed Medicaid Plan:  Yes  Subjective:  Care Team: Primary Care Provider: Ladell Pier, MD ; Next Scheduled Visit: 08/25/21 Cardiologist: Geraldo Pitter; Next Scheduled Visit: due November 2023  Medication Access/Adherence  Current Pharmacy:  [facility] [place] [address] Phone: [phone] Fax: [phone]   Patient reports affordability concerns with their medications: No  Patient reports access/transportation concerns to their pharmacy: No  Patient reports adherence concerns with their medications:  Yes  reports she sometimes dozes off and forgets evening medications. Again discussed setting an alarm.    Hypertension:  Current medications: amlodipine 10 mg daily, carvedilol 25 mg BID, losartan-HCTZ 100-25 mg daily  Medications previously tried:   Patient has a validated, automated, upper arm home BP cuff Current blood pressure readings readings: reports she takes prior to taking medications, reports reading was 161/101 this morning.  Patient denies hypertensive symptoms including headache, chest pain, shortness of breath  Reports that she doesn't think she needs nitroglycerin  Reports she had not taken her antihypertensives prior to cardiology appointment  Hyperlipidemia/ASCVD Risk Reduction  Current lipid lowering medications: atorvastatin 30 mg daily Medications tried in the past:   Antiplatelet regimen: aspirin 81 mg daily  PAD: also prescribed cilostazol, but does not take consistently due to lack of perceived benefit on claudication symptoms   Health Maintenance  Health Maintenance Due  Topic Date Due    COVID-19 Vaccine (1) Never done   Zoster Vaccines- Shingrix (1 of 2) Never done   PAP SMEAR-Modifier  06/21/2020     Objective: Lab Results  Component Value Date   HGBA1C 5.3 (A) 04/29/2020    Lab Results  Component Value Date   CREATININE 0.99 03/02/2021   BUN 16 03/02/2021   NA 139 03/02/2021   K 3.9 03/02/2021   CL 98 03/02/2021   CO2 26 03/02/2021    Lab Results  Component Value Date   CHOL 173 03/02/2021   HDL 58 03/02/2021   LDLCALC 96 03/02/2021   TRIG 105 03/02/2021   CHOLHDL 3.0 03/02/2021    Medications Reviewed Today     Reviewed by Osker Mason, RPH-CPP (Pharmacist) on 06/27/21 at 1018  Med List Status: <None>   Medication Order Taking? Sig Documenting Provider Last Dose Status Informant  amLODipine (NORVASC) 10 MG tablet [id number] Yes Take 1 tablet (10 mg total) by mouth daily. Argentina Donovan, [place] Taking Active   aspirin 81 MG chewable tablet [id number] Yes Chew 1 tablet (81 mg total) by mouth daily. Argentina Donovan, PA-C Taking Active   atorvastatin (LIPITOR) 20 MG tablet [id number] Yes Take 1.5 tablets (30 mg total) by mouth daily. Mathis Dad Taking Active   Blood Pressure Monitor DEVI [id number]  Use as directed to check home blood pressure 2-3 times a week Ladell Pier, MD  Active Self  carvedilol (COREG) 25 MG tablet [id number] Yes Take 1 tablet (25 mg total) by mouth 2 (two) times daily. Argentina Donovan, PA-C Taking Active   cholecalciferol (VITAMIN D3) 25 MCG (1000 UT) tablet [id number] Yes Take 1,000 Units by mouth daily. [provider] Taking Active Self  cilostazol (PLETAL) 100 MG tablet [id number] No Take 1 tablet (100 mg total) by mouth 2 (two) times daily.  Patient not taking: Reported on 06/27/2021   Argentina Donovan, [place] Not Taking Active   cloNIDine (CATAPRES) tablet 0.1 mg [id number]   Argentina Donovan, PA-C  Active   ferrous sulfate 325 (65 FE) MG tablet [id number] No Take 1 tablet (325 mg total) by  mouth 2 (two) times daily with a meal.  Patient not taking: Reported on 06/27/2021   Ladell Pier, MD Not Taking Active   losartan-hydrochlorothiazide [facility]) 100-25 MG tablet [id number] Yes Take 1 tablet by mouth daily. Freeman Caldron M, PA-C Taking Active   nitroGLYCERIN (NITROSTAT) 0.4 MG SL tablet [id number]  Place 1 tablet (0.4 mg total) under the tongue every 5 (five) minutes as needed. Revankar, Reita Cliche, MD  Active   potassium chloride (KLOR-CON) 8 MEQ tablet [id number] Yes Take 2 tablets (16 mEq total) by mouth daily. Argentina Donovan, PA-C Taking Active             SDOH Interventions    Flowsheet Row Most Recent Value  SDOH Interventions   SDOH Interventions for the Following Domains Tobacco  Financial Strain Interventions Intervention Not Indicated  Tobacco Interventions Cessation Materials Given and Reviewed       Assessment/Plan:  Care Plan : Medication Management  Updates made by Osker Mason, RPH-CPP since 06/27/2021 12:00 AM     Problem: Hypertension      Goal: BP <130/80   Note:   Current Barriers:  Unable to achieve control of blood pressure  Does not adhere to prescribed medication regimen  Patient Needs: Consistent administration routine  Patient Activities: Patient will:  - take medications as prescribed as evidenced by patient report and record review check blood pressure daily, document, and provide at future appointments     Problem: Hyperlipidemia      Goal: LDL <60   Note:   Current Barriers:  Unable to achieve control of cardiovascular risk   Patient Needs: Therapy optimization  Patient Activities: Patient will:  - take medications as prescribed as evidenced by patient report and record review      Hypertension: - Currently uncontrolled prior to medications, but improved after.  - Reviewed importance of taking antihypertensives prior to visits. She doesn't like feeling queasy when she takes her medications on an  empty stomach, so if she doesn't eat before an appointment she won't take medications. Encouraged communication with healthcare team about when she actually does need to fast before appointments and when she doesn't.  - Reviewed appropriate blood pressure monitoring technique and reviewed goal blood pressure. Recommended to check home blood pressure and heart rate daily - Recommend to consider moving amlodipine to evening administration (along with techniques to improve adherence) as pre- AM readings are significantly elevated.    Hyperlipidemia/ASCVD Risk Reduction: - Currently uncontrolled.  - Reviewed long term complications of uncontrolled cholesterol. Reviewed more aggressive goal for LDL given PAD.  - Unclear how adherent patient was to her regimen prior to last lipid panel in February. Discussed and agreed through shared decision making that patient would focus on adherence to atorvastatin prior to PCP appointment in July. If LDL still above goal at that time, consider increasing atorvastatin to 40 mg daily. This would also aid in ease of administration to take 1 tablet daily rather than 1.5 of 20 mg daily.   Follow Up Plan: telephone call in ~  8 weeks per patient preference  [name], PharmD, [facility] [phone]

## 2021-06-27 NOTE — Patient Instructions (Signed)
Stiggers,   It was great talking with you today!  We could consider changing your amlodipine to being taken at night, so that you are taking carvedilol and losartan/HCTZ in the morning, and carvedilol and amlodipine at night. That may provide better blood pressure lowering overnight so you aren't waking up with your blood pressure in the 160s. I encourage you to set an alarm to remind you to take your evening medications - we don't want you forgetting the amlodipine because we moved the time you take it!  I encourage you to pick up the nitroglycerin. It's better to have and not need than need and not have!  I encourage you to focus on taking the atorvastatin every day between now and your appointment with Dr. Wynetta Emery in July. If your LDL is not at goal (Dr. Geraldo Pitter wants it less than 60!) I recommend we talk to your team about increasing the atorvastatin to 40 mg daily.   Check your blood pressure once daily, and any time you have concerning symptoms like headache, chest pain, dizziness, shortness of breath, or vision changes.   Our goal is less than 130/80.  To appropriately check your blood pressure, make sure you do the following:  1) Avoid caffeine, exercise, or tobacco products for 30 minutes before checking. Empty your bladder. 2) Sit with your back supported in a flat-backed chair. Rest your arm on something flat (arm of the chair, table, etc). 3) Sit still with your feet flat on the floor, resting, for at least 5 minutes.  4) Check your blood pressure. Take 1-2 readings.  5) Write down these readings and bring with you to any provider appointments.  Bring your home blood pressure machine with you to a provider's office for accuracy comparison at least once a year.   Make sure you take your blood pressure medications before you come to any office visit, even if you were asked to fast for labs. Always communicate with your medical team to see if you actually need to be fasting before an  appointment - you may not always need to be!  Take care,   [name], PharmD, [facility] [phone]

## 2021-07-03 ENCOUNTER — Encounter (HOSPITAL_COMMUNITY): Payer: Self-pay | Admitting: Radiology

## 2021-07-04 ENCOUNTER — Ambulatory Visit: Payer: Medicaid Other | Attending: Internal Medicine | Admitting: Pharmacist

## 2021-07-04 ENCOUNTER — Encounter: Payer: Self-pay | Admitting: Pharmacist

## 2021-07-04 ENCOUNTER — Other Ambulatory Visit: Payer: Self-pay

## 2021-07-04 VITALS — BP 150/94

## 2021-07-04 DIAGNOSIS — I10 Essential (primary) hypertension: Secondary | ICD-10-CM | POA: Diagnosis not present

## 2021-07-04 MED ORDER — AMLODIPINE BESYLATE 10 MG PO TABS: 10.0000 mg | ORAL_TABLET | Freq: Every day | ORAL | 3 refills | Status: DC

## 2021-07-04 MED ORDER — VALSARTAN-HYDROCHLOROTHIAZIDE 320-25 MG PO TABS
1.0000 | ORAL_TABLET | Freq: Every day | ORAL | 3 refills | Status: DC
Start: 2021-07-04 — End: 2021-11-10

## 2021-07-04 MED ORDER — AMLODIPINE-VALSARTAN-HCTZ 10-320-25 MG PO TABS: 1.0000 | ORAL_TABLET | Freq: Every day | ORAL | 3 refills | Status: DC

## 2021-07-04 NOTE — Progress Notes (Signed)
   S:    PCP: Dr. Wynetta Emery  Patient arrives in good spirits. Presents to the clinic for hypertension, counseling, and management. Patient was referred by Freeman Caldron on 05/11/2021. Last seen by pharmacist on 06/27/21 where BP was elevated but had not taken medications prior to appointment. Patient was encouraged to take all medications before appointments and switch amlodipine to nighttime administration.    Today, she reports some issues with medication adherence. Often forgets to take nighttime doses and confused on which medications took this morning before appointment.  No chest pain, dyspnea, HA, blurred vision.   Current BP Medications include:  amlodipine 10 mg daily, carvedilol 25 mg BID, losartan-HCTZ 100-25 mg daily   Dietary habits include: attempting to limit salt. Drinks coffee in the mornings.   Exercise habits include: none  Family / Social history:  Fhx: HLD, HTN, heart disease  Tobacco: 0.5 PPD smoker  Alcohol: 1 drink standard per today   O:  Vitals:   07/04/21 0942  BP: (!) 150/94    Reported Home BP readings: SBP 150-200s  Last 3 Office BP readings: BP Readings from Last 3 Encounters:  07/04/21 (!) 150/94  06/15/21 (!) 142/84  06/02/21 126/82    BMET    Component Value Date/Time   NA 139 03/02/2021 1523   K 3.9 03/02/2021 1523   CL 98 03/02/2021 1523   CO2 26 03/02/2021 1523   GLUCOSE 66 (L) 03/02/2021 1523   GLUCOSE 88 07/13/2019 0740   BUN 16 03/02/2021 1523   CREATININE 0.99 03/02/2021 1523   CALCIUM 9.0 03/02/2021 1523   GFRNONAA 68 05/11/2019 1534   GFRAA 79 05/11/2019 1534    Renal function: CrCl cannot be calculated (Patient's most recent lab result is older than the maximum 21 days allowed.).  Clinical ASCVD: No  The 10-year ASCVD risk score (Arnett DK, et al., 2019) is: 19.8%   Values used to calculate the score:     Age: 61 years     Sex: Female     Is Non-Hispanic African American: Yes     Diabetic: No     Tobacco smoker:  Yes     Systolic Blood Pressure: 474 mmHg     Is BP treated: Yes     HDL Cholesterol: 58 mg/dL     Total Cholesterol: 173 mg/dL   A/P: -Hypertension diagnosed currently uncontrolled. BP Goal = < 130/80 mmHg. Medication non-adherence reported.  -Attempted to combine valsartan '320mg'$ /HCTZ '25mg'$ /amlodipine '10mg'$  to consolidate number of medications taking each day. Unfortunately, this combination product is no longer available from manufacturer. Changed losartan '100mg'$ /HCTZ '25mg'$  to valsartan '320mg'$ /HCTZ '25mg'$ . Continue all other medications. -Reviewed medication changes, provided new medication list, and discussed discarding old prescriptions at [facility] on lifestyle modifications for blood pressure control including reduced dietary sodium, increased exercise, adequate sleep. -Reminded patient about upcoming cardiology appointment and to take medications prior to appointment.  Results reviewed and written information provided. Total time in face-to-face counseling 30 minutes.   F/U Clinic Visit in 1 month with pharmacist.   Thank you for allowing pharmacy to participate in this patient's care.  Levonne Spiller, PharmD PGY1 Acute Care Resident  07/04/2021,1:37 PM

## 2021-07-05 ENCOUNTER — Ambulatory Visit (HOSPITAL_COMMUNITY): Payer: Medicaid Other | Attending: Cardiovascular Disease

## 2021-07-05 DIAGNOSIS — R011 Cardiac murmur, unspecified: Secondary | ICD-10-CM

## 2021-07-05 DIAGNOSIS — R079 Chest pain, unspecified: Secondary | ICD-10-CM | POA: Diagnosis not present

## 2021-07-05 DIAGNOSIS — I2510 Atherosclerotic heart disease of native coronary artery without angina pectoris: Secondary | ICD-10-CM

## 2021-07-05 DIAGNOSIS — I350 Nonrheumatic aortic (valve) stenosis: Secondary | ICD-10-CM

## 2021-07-05 HISTORY — DX: Nonrheumatic aortic (valve) stenosis: I35.0

## 2021-07-05 LAB — ECHOCARDIOGRAM COMPLETE
AR max vel: 1.23 cm2
AV Area VTI: 1.2 cm2
AV Area mean vel: 1.32 cm2
AV Mean grad: 13 mmHg
AV Peak grad: 25.2 mmHg
Ao pk vel: 2.51 m/s
Area-P 1/2: 2.21 cm2
S' Lateral: 3 cm

## 2021-08-25 ENCOUNTER — Encounter: Payer: Self-pay | Admitting: Internal Medicine

## 2021-08-25 ENCOUNTER — Ambulatory Visit: Payer: Medicaid Other | Attending: Internal Medicine | Admitting: Internal Medicine

## 2021-08-25 VITALS — BP 174/102 | HR 64 | Ht 64.0 in | Wt 138.8 lb

## 2021-08-25 DIAGNOSIS — J9859 Other diseases of mediastinum, not elsewhere classified: Secondary | ICD-10-CM | POA: Diagnosis not present

## 2021-08-25 DIAGNOSIS — Z122 Encounter for screening for malignant neoplasm of respiratory organs: Secondary | ICD-10-CM | POA: Diagnosis not present

## 2021-08-25 DIAGNOSIS — E782 Mixed hyperlipidemia: Secondary | ICD-10-CM

## 2021-08-25 DIAGNOSIS — I10 Essential (primary) hypertension: Secondary | ICD-10-CM | POA: Diagnosis not present

## 2021-08-25 DIAGNOSIS — Z1231 Encounter for screening mammogram for malignant neoplasm of breast: Secondary | ICD-10-CM | POA: Diagnosis not present

## 2021-08-25 DIAGNOSIS — R911 Solitary pulmonary nodule: Secondary | ICD-10-CM | POA: Diagnosis not present

## 2021-08-25 DIAGNOSIS — F17200 Nicotine dependence, unspecified, uncomplicated: Secondary | ICD-10-CM

## 2021-08-25 DIAGNOSIS — I700 Atherosclerosis of aorta: Secondary | ICD-10-CM

## 2021-08-25 NOTE — Progress Notes (Signed)
Patient ID: Brooke Chapman, female    DOB: 1960/10/22  MRN: [id number]  CC: Chronic disease management   Subjective: Brooke Chapman is a 61 y.o. 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 Roxan Hockey, questionably benign schwannoma.  Patient not wanting biopsy.  Plan to monitor with repeat imaging)  HTN: Patient has been seeing the clinical pharmacist for follow-up with blood pressure.  Currently on Diovan/HCTZ 325 mg / 25 mg, Norvasc 10 mg daily and carvedilol 25 mg twice a day.  She has her medications with her.  She has all of her medications including the Diovan HCTZ and carvedilol in one bag which she states are the medicines that she is currently on.  She also has a separate bag that has amlodipine in it with 2 unopen bottles that were filled back in June.  She tells me this bag is her extra medication that she is not taking. -Has a weekly pillbox with her as well.  I note that amlodipine is not in the pillbox.  Patient was not aware of it stating that she had previously misplaced the amlodipine bottles and knew that she was missing one of her medicines but not sure which one it was until I pointed this out to her today. -She brings her blood pressure device with her.  Some of her recent readings are: 140/97, 177/102, 150/100 -Denies any chest pain/CP/LE edema  Aortic atherosclerosis/HL: Saw the cardiologist Dr. Geraldo Pitter in May.  Advised to continue carvedilol and atorvastatin.  Tobacco dependence: She continues to smoke about 10 to 15 cigarettes/day.  She has smoked for 55 years.  Prior to cutting back she was 1 pack a day smoker.  She is not ready to quit.  Had CT scan of the chest last year for lung cancer screening.  It revealed a right lower lobe  nodule 4 mm.  Also incidental finding of a posterior mediastinal mass for which I referred her to see CT surgeon Dr. Modesto Charon.  Patient declined biopsy and opted for monitoring with repeat imaging.  However she has not gone back to see him.  Patient Active Problem List   Diagnosis Date Noted   Coronary artery disease 06/15/2021   Chest pain of uncertain etiology 00/86/7619   Cardiac murmur 06/15/2021   Anemia 06/13/2021   Skin lesion 05/26/2021   GAD (generalized anxiety disorder) 05/01/2020   Black stools 01/13/2020   Unintended weight loss 01/13/2020   Antiplatelet or antithrombotic long-term use 01/13/2020   Hypertension    Frontal balding 12/28/2019   Iron deficiency anemia 12/28/2019   Essential hypertension    Porokeratosis 08/26/2018   PVD (peripheral vascular disease) ([place]) 08/26/2018   Hyperlipidemia 05/02/2018   HTN (hypertension) 05/01/2018   Tobacco dependence 05/01/2018   PAD (peripheral artery disease) ([place])      Current Outpatient Medications on File Prior to Visit  Medication Sig Dispense Refill   amLODipine (NORVASC) 10 MG tablet Take 1 tablet (10 mg total) by mouth daily. 30 tablet 3   aspirin 81 MG chewable tablet Chew 1 tablet (81 mg total) by mouth daily. 100 tablet 1   atorvastatin (LIPITOR) 20 MG tablet Take 1.5 tablets (30 mg total) by mouth daily. 135 tablet 3   Blood Pressure Monitor DEVI  Use as directed to check home blood pressure 2-3 times a week 1 Device 0   cholecalciferol (VITAMIN D3) 25 MCG (1000 UT) tablet Take 1,000 Units by mouth daily.     ferrous sulfate 325 (65 FE) MG tablet Take 1 tablet (325 mg total) by mouth 2 (two) times daily with a meal. 100 tablet 1   potassium chloride (KLOR-CON) 8 MEQ tablet Take 2 tablets (16 mEq total) by mouth daily. 180 tablet 1   valsartan-hydrochlorothiazide (DIOVAN-HCT) 320-25 MG tablet Take 1 tablet by mouth daily. 30 tablet 3   carvedilol (COREG) 25 MG tablet Take 1 tablet (25 mg total) by mouth 2  (two) times daily. 180 tablet 3   cilostazol (PLETAL) 100 MG tablet Take 1 tablet (100 mg total) by mouth 2 (two) times daily. (Patient not taking: Reported on 06/27/2021) 180 tablet 3   nitroGLYCERIN (NITROSTAT) 0.4 MG SL tablet Place 1 tablet (0.4 mg total) under the tongue every 5 (five) minutes as needed. 25 tablet 6   Current Facility-Administered Medications on File Prior to Visit  Medication Dose Route Frequency Provider Last Rate Last Admin   cloNIDine (CATAPRES) tablet 0.1 mg  0.1 mg Oral Once Argentina Donovan, PA-C        Allergies  Allergen Reactions   Latex Rash    Social History   Socioeconomic History   Marital status: Single    Spouse name: Not on file   Number of children: 0   Years of education: Not on file   Highest education level: Not on file  Occupational History   Occupation: Prep cook  Tobacco Use   Smoking status: Every Day    Packs/day: 0.50    Types: Cigarettes   Smokeless tobacco: Never   Tobacco comments:    6-10 cigs daily  Vaping Use   Vaping Use: Never used  Substance and Sexual Activity   Alcohol use: Yes    Comment: 1 per day   Drug use: Never   Sexual activity: Not Currently  Other Topics Concern   Not on file  Social History Narrative   Not on file   Social Determinants of Health   Financial Resource Strain: Low Risk  (06/27/2021)   Overall Financial Resource Strain (CARDIA)    Difficulty of Paying Living Expenses: Not hard at all  Food Insecurity: Not on file  Transportation Needs: Not on file  Physical Activity: Not on file  Stress: Not on file  Social Connections: Not on file  Intimate Partner Violence: Not on file    Family History  Problem Relation Age of Onset   Heart disease Mother    Hypertension Mother    Hyperlipidemia Mother    Hypertension Brother    Heart disease Maternal Aunt    Hypertension Maternal Aunt    Hyperlipidemia Maternal Aunt    Colon cancer Neg Hx    Liver cancer Neg Hx    Rectal cancer Neg  Hx    Stomach cancer Neg Hx     Past Surgical History:  Procedure Laterality Date   ABDOMINAL AORTOGRAM W/LOWER EXTREMITY Bilateral 07/13/2019   Procedure: ABDOMINAL AORTOGRAM W/LOWER EXTREMITY;  Surgeon: Waynetta Sandy, MD;  Location: [facility];  Service: Cardiovascular;  Laterality: Bilateral;   BREAST EXCISIONAL BIOPSY Left    LESS THAN 5 YEARS AGO   PERIPHERAL VASCULAR INTERVENTION Left 07/13/2019   Procedure: PERIPHERAL VASCULAR INTERVENTION;  Surgeon: Waynetta Sandy, MD;  Location: [facility];  Service: Cardiovascular;  Laterality: Left;  common iliac    ROS: Review of Systems Negative except as stated above  PHYSICAL EXAM: BP (!) 174/102   Pulse 64   Ht '5\' 4"'$  (1.626 m)   Wt 138 lb 12.8 oz (63 kg)   SpO2 100%   BMI 23.82 kg/m   Wt Readings from Last 3 Encounters:  08/25/21 138 lb 12.8 oz (63 kg)  06/23/21 137 lb (62.1 kg)  06/15/21 137 lb (62.1 kg)    Physical Exam  General appearance - alert, well appearing, and in no distress Mental status - normal mood, behavior, she is a little forgetful. Neck - supple, no significant adenopathy Chest - clear to auscultation, no wheezes, rales or rhonchi, symmetric air entry Heart - normal rate, regular rhythm, normal S1, S2, no murmurs, rubs, clicks or gallops Extremities - peripheral pulses normal, no pedal edema, no clubbing or cyanosis Skin: She has chronic scarring with some hypopigmentation on the left cheek     Latest Ref Rng & Units 03/02/2021    3:23 PM 02/02/2021    2:35 PM 11/09/2020    2:50 PM  CMP  Glucose 70 - 99 mg/dL 66  64  90   BUN 8 - 27 mg/dL '16  17  16   '$ Creatinine 0.57 - 1.00 mg/dL 0.99  1.26  1.14   Sodium 134 - 144 mmol/L 139  137  136   Potassium 3.5 - 5.2 mmol/L 3.9  3.8  4.4   Chloride 96 - 106 mmol/L 98  93  98   CO2 20 - 29 mmol/L 26  32  27   Calcium 8.7 - 10.3 mg/dL 9.0  9.6  8.9   Total Protein 6.0 - 8.5 g/dL 7.0  7.4  6.3   Total Bilirubin 0.0 - 1.2  mg/dL <0.2  0.2  <0.2   Alkaline Phos 44 - 121 IU/L 63  64  65   AST 0 - 40 IU/L '21  18  21   '$ ALT 0 - 32 IU/L '8  8  12    '$ Lipid Panel     Component Value Date/Time   CHOL 173 03/02/2021 1523   TRIG 105 03/02/2021 1523   HDL 58 03/02/2021 1523   CHOLHDL 3.0 03/02/2021 1523   LDLCALC 96 03/02/2021 1523    CBC    Component Value Date/Time   WBC 4.6 03/02/2021 1523   WBC 5.3 03/23/2020 0813   RBC 3.96 03/02/2021 1523   RBC 3.85 (L) 03/23/2020 0813   HGB 12.4 03/02/2021 1523   HCT 37.2 03/02/2021 1523   PLT 227 03/02/2021 1523   MCV 94 03/02/2021 1523   MCH 31.3 03/02/2021 1523   MCH 31.1 04/10/2018 1203   MCHC 33.3 03/02/2021 1523   MCHC 33.3 03/23/2020 0813   RDW 13.0 03/02/2021 1523   LYMPHSABS 2.3 03/02/2021 1523   EOSABS 0.1 03/02/2021 1523   BASOSABS 0.0 03/02/2021 1523    ASSESSMENT AND PLAN: 1. Essential hypertension Blood pressure not at goal.  Apparently she has not been taking the amlodipine and did not realize it.  She had to unopen bottles of amlodipine with her today.  Advised patient to take 1 a day.  Continue the Diovan/HCTZ and carvedilol 25 mg twice a day.  Continue to monitor blood pressure with goal being 130/80 or lower.  2. Tobacco dependence Strongly advised to quit.  Patient not ready to give a trial of quitting.  3. Aortic atherosclerosis (HCC) Continue carvedilol and atorvastatin  4. Mixed hyperlipidemia Continue atorvastatin.  5. Lung nodule Patient agreeable for repeat CT to follow-up on lung nodule.  Strongly advised smoking sensation.  She is at increased risk for lung cancer due to long history of cigarette smoking - CT CHEST NODULE FOLLOW UP LOW DOSE W/O; Future  6. Mediastinal mass - CT CHEST NODULE FOLLOW UP LOW DOSE W/O; Future  7. Screening for lung cancer - CT CHEST NODULE FOLLOW UP LOW DOSE W/O; Future  8. Encounter for screening mammogram for malignant neoplasm of breast - MM Digital Screening; Future     Patient was  given the opportunity to ask questions.  Patient verbalized understanding of the plan and was able to repeat key elements of the plan.   This documentation was completed using Radio producer.  Any transcriptional errors are unintentional.  Orders Placed This Encounter  Procedures   MM Digital Screening   CT CHEST NODULE FOLLOW UP LOW DOSE W/O     Requested Prescriptions    No prescriptions requested or ordered in this encounter    Return in about 6 weeks (around 10/06/2021) for PAP.  Karle Plumber, MD, FACP

## 2021-08-29 ENCOUNTER — Other Ambulatory Visit: Payer: Self-pay | Admitting: Pharmacist

## 2021-08-29 NOTE — Patient Instructions (Signed)
Brooke Chapman,   Keep up the great work focusing on taking your medications consistently! Your blood pressure continues to improve.   Keep thinking through ways of handling stress or keeping yourself busy besides smoking. Let us know if you are interested in talking about any medications (like Chantix or nicotine patches + nicotine gum) to help you be more able to quit. Also consider visiting the [facility] Quitline website (or call 1-800-QUIT-NOW). They have resources and support for people who are thinking about or trying to quit smoking.   Check your blood pressure once daily, and any time you have concerning symptoms like headache, chest pain, dizziness, shortness of breath, or vision changes.   Our goal is less than 130/80.  To appropriately check your blood pressure, make sure you do the following:  1) Avoid caffeine, exercise, or tobacco products for 30 minutes before checking. Empty your bladder. 2) Sit with your back supported in a flat-backed chair. Rest your arm on something flat (arm of the chair, table, etc). 3) Sit still with your feet flat on the floor, resting, for at least 5 minutes.  4) Check your blood pressure. Take 1-2 readings.  5) Write down these readings and bring with you to any provider appointments.  Bring your home blood pressure machine with you to a provider's office for accuracy comparison at least once a year.   Make sure you take your blood pressure medications before you come to any office visit, even if you were asked to fast for labs.   Take care!  Brooke Chapman [name], PharmD, [facility] [phone]

## 2021-08-29 NOTE — Chronic Care Management (AMB) (Signed)
Chief Complaint  Patient presents with   Hypertension    Brooke Chapman is a 61 y.o. year old female who presented for a telephone visit.   They were referred to the pharmacist by a quality report for assistance in managing hypertension.   Patient is participating in a Managed Medicaid Plan:  Yes  Subjective:  Care Team: Primary Care Provider: Ladell Pier, MD ; Next Scheduled Visit: 10/06/21  Medication Access/Adherence  Current Pharmacy:  [facility], [place] [address] Phone: [phone] Fax: [phone]   Patient reports affordability concerns with their medications: No  Patient reports access/transportation concerns to their pharmacy: No  Patient reports adherence concerns with their medications:  Yes , but improved - patient reports that she missed 2 evening doses in the past week, which is better than previously. Reports she sometimes just falls asleep and doesn't want to get back up to take her medications. However, notes that now that she is checking blood pressures daily, she is more aware of the impact of missing doses    Hypertension:  Current medications: amlodipine 10 mg daily, carvedilol 25 mg twice daily, valsartan/HCTZ 320/25 mg daily - noted at PCP visit last week that she had not been taking amlodipine, but had the bottle. Has been taking the last several days. Denies any new lower extremity edema.  Patient has a validated, automated, upper arm home BP cuff Current blood pressure readings readings: ~130-140s/100s  Patient denies hypotensive s/sx including dizziness, lightheadedness.    Tobacco Abuse:  Tobacco Use History: Number of cigarettes per day: 10 cigarettes (1/2 ppd) Reports that since she is home for the summer, so she is more able to smoke. 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  Triggers include stress;  Zettie Pho is a stress reliever for her, games on her phone help keep her hands busy.    Health Maintenance  Health Maintenance Due  Topic Date Due   COVID-19 Vaccine (1) Never done   Zoster Vaccines- Shingrix (1 of 2) Never done   PAP SMEAR-Modifier  06/21/2020   MAMMOGRAM  08/26/2021   INFLUENZA VACCINE  08/29/2021     Objective: Lab Results  Component Value Date   HGBA1C 5.3 (A) 04/29/2020    Lab Results  Component Value Date   CREATININE 0.99 03/02/2021   BUN 16 03/02/2021   NA 139 03/02/2021   K 3.9 03/02/2021   CL 98 03/02/2021   CO2 26 03/02/2021    Lab Results  Component Value Date   CHOL 173 03/02/2021   HDL 58 03/02/2021   LDLCALC 96 03/02/2021   TRIG 105 03/02/2021   CHOLHDL 3.0 03/02/2021    Medications Reviewed Today     Reviewed by Osker Mason, RPH-CPP (Pharmacist) on 08/29/21 at 1026  Med List Status: <None>   Medication Order Taking? Sig Documenting Provider Last Dose Status Informant  amLODipine (NORVASC) 10 MG tablet [id number] Yes Take 1 tablet (10 mg total) by mouth daily. Ladell Pier, MD Taking Active   aspirin 81 MG chewable tablet [id number] Yes Chew 1 tablet (81 mg total) by mouth daily. Freeman Caldron M, PA-C Taking Active   atorvastatin (LIPITOR) 20 MG  tablet [id number] Yes Take 1.5 tablets (30 mg total) by mouth daily. Mathis Dad Taking Active   Blood Pressure Monitor DEVI [id number] Yes Use as directed to check home blood pressure 2-3 times a week Ladell Pier, MD Taking Active Self  carvedilol (COREG) 25 MG tablet [id number] Yes Take 1 tablet (25 mg total) by mouth 2 (two) times daily. Argentina Donovan, PA-C Taking Active   cholecalciferol (VITAMIN D3) 25 MCG (1000 UT) tablet [id number] Yes Take 1,000 Units by mouth daily. [provider] Taking Active Self   cilostazol (PLETAL) 100 MG tablet [id number] No Take 1 tablet (100 mg total) by mouth 2 (two) times daily.  Patient not taking: Reported on 06/27/2021   Argentina Donovan, [place] Not Taking Active   cloNIDine (CATAPRES) tablet 0.1 mg [id number]   Argentina Donovan, PA-C  Consider Medication Status and Discontinue   ferrous sulfate 325 (65 FE) MG tablet [id number] Yes Take 1 tablet (325 mg total) by mouth 2 (two) times daily with a meal. Ladell Pier, MD Taking Active            Med Note Jodi Mourning, Filomena Pokorney T   Tue Aug 29, 2021 10:23 AM)    nitroGLYCERIN (NITROSTAT) 0.4 MG SL tablet [id number]  Place 1 tablet (0.4 mg total) under the tongue every 5 (five) minutes as needed. Revankar, Reita Cliche, MD  Active   potassium chloride (KLOR-CON) 8 MEQ tablet [id number] Yes Take 2 tablets (16 mEq total) by mouth daily. Freeman Caldron M, PA-C Taking Active   valsartan-hydrochlorothiazide (DIOVAN-HCT) 320-25 MG tablet [id number] Yes Take 1 tablet by mouth daily. Ladell Pier, MD Taking Active               Assessment/Plan:   Hypertension: - Currently uncontrolled but improved  - Reviewed long term cardiovascular and renal outcomes of uncontrolled blood pressure - Reviewed appropriate blood pressure monitoring technique and reviewed goal blood pressure. Recommended to check home blood pressure and heart rate daily - Praised for improvement in adherence and improvement in blood pressure. As patient just started retaking amlodipine, will continue current regimen at this time. Recommended to continue current regimen. Encouraged continued focus on adherence.  - Encouraged consideration for tobacco cessation as below.   Tobacco Abuse - Currently uncontrolled - Provided motivational interviewing to assess tobacco use and strategies for reduction. Discussed stress relievers (gardening, games on phone). Discussed benefit of chantix vs dual nicotine replacement therapy. Patient is not interested in  pharmacotherapy at this time. Will continue to re-visit moving forward.   Follow Up Plan: phone call in 2 weeks  Catie TJodi Mourning, PharmD, [facility] [phone]

## 2021-09-01 ENCOUNTER — Ambulatory Visit
Admission: RE | Admit: 2021-09-01 | Discharge: 2021-09-01 | Disposition: A | Discharge status: Home / Self Care | Payer: Medicaid Other | Source: Ambulatory Visit | Attending: Internal Medicine | Admitting: Internal Medicine

## 2021-09-01 DIAGNOSIS — Z1231 Encounter for screening mammogram for malignant neoplasm of breast: Secondary | ICD-10-CM

## 2021-09-12 ENCOUNTER — Other Ambulatory Visit: Payer: Self-pay | Admitting: Pharmacist

## 2021-09-12 DIAGNOSIS — I10 Essential (primary) hypertension: Secondary | ICD-10-CM

## 2021-09-12 NOTE — Chronic Care Management (AMB) (Signed)
Chief Complaint  Patient presents with   Hypertension    Brooke Chapman is a 61 y.o. year old female who presented for a telephone visit.   They were referred to the pharmacist by a quality report for assistance in managing hypertension.   Patient is participating in a Managed Medicaid Plan:  Yes  Subjective:  Care Team: Primary Care Provider: Ladell Pier, MD ; Next Scheduled Visit: 10/06/21  Medication Access/Adherence  Current Pharmacy:  [name], [place] [address] Phone: [phone] Fax: [phone]   Patient reports affordability concerns with their medications: No  Patient reports access/transportation concerns to their pharmacy: No  Patient reports adherence concerns with their medications:  No     Hypertension:  Current medications: carvedilol 25 mg twice daily, amlodipine 10 mg daily, valsartan/HCTZ 320/25 mg daily  Patient has a validated, automated, upper arm home BP cuff Current blood pressure readings readings: 120-130s/80s; had a reading yesterday 90/70  Patient denies hypotensive s/sx including dizziness, lightheadedness.   Current meal patterns: reports she minimizes "meat"   Hyperlipidemia/ASCVD Risk Reduction  Current lipid lowering medications: atorvastatin 30 mg daily (1.5 tablets of atorvastatin 20 mg) - reports she needs to pick up a refill   Antiplatelet regimen: aspirin 81 mg daily   Health Maintenance  Health Maintenance Due  Topic Date Due   COVID-19 Vaccine (1) Never done   Zoster Vaccines- Shingrix (1 of 2) Never done   PAP SMEAR-Modifier  06/21/2020   INFLUENZA VACCINE  08/29/2021     Objective: Lab Results  Component Value Date   HGBA1C 5.3 (A) 04/29/2020    Lab Results  Component Value Date   CREATININE 0.99 03/02/2021   BUN 16 03/02/2021   NA 139 03/02/2021   K 3.9 03/02/2021   CL 98 03/02/2021   CO2 26 03/02/2021    Lab  Results  Component Value Date   CHOL 173 03/02/2021   HDL 58 03/02/2021   LDLCALC 96 03/02/2021   TRIG 105 03/02/2021   CHOLHDL 3.0 03/02/2021    Medications Reviewed Today     Reviewed by Brooke Chapman, RPH-CPP (Pharmacist) on 09/12/21 at [phone]  Med List Status: <None>   Medication Order Taking? Sig Documenting Provider Last Dose Status Informant  amLODipine (NORVASC) 10 MG tablet [id number] Yes Take 1 tablet (10 mg total) by mouth daily. Brooke Pier, MD Taking Active   aspirin 81 MG chewable tablet [id number] Yes Chew 1 tablet (81 mg total) by mouth daily. Brooke Donovan, PA-C Taking Active   atorvastatin (LIPITOR) 20 MG tablet [id number] Yes Take 1.5 tablets (30 mg total) by mouth daily. Brooke Chapman Taking Active   Blood Pressure Monitor DEVI [id number]  Use as directed to check home blood pressure 2-3 times a week Brooke Pier, MD  Active Self  carvedilol (COREG) 25 MG tablet [id number] Yes Take 1 tablet (25 mg total) by mouth 2 (two) times daily. Brooke Donovan, PA-C Taking Active   cholecalciferol (VITAMIN D3) 25 MCG (1000 UT) tablet [id number] Yes Take 1,000 Units by mouth daily. [provider] Taking Active Self  cilostazol (PLETAL) 100 MG tablet [id number] Yes Take 1 tablet (100 mg total) by mouth 2 (two) times daily. Brooke Chapman, [place] Taking Active   cloNIDine (CATAPRES) tablet 0.1 mg [id number]   Brooke Donovan, PA-C  Consider Medication Status and Discontinue (Completed Course)  ferrous sulfate 325 (65 FE) MG tablet [id number] Yes Take 1 tablet (325 mg total) by mouth 2 (two) times daily with a meal. Brooke Pier, MD Taking Active            Med Note Jodi Mourning, Aaden Buckman T   Tue Sep 12, 2021  9:21 AM) Taking once weekly  nitroGLYCERIN (NITROSTAT) 0.4 MG SL tablet [id number]  Place 1 tablet (0.4 mg total) under the tongue every 5 (five) minutes as needed. Revankar, Reita Cliche, MD  Active   potassium chloride (KLOR-CON) 8 MEQ tablet  [id number] Yes Take 2 tablets (16 mEq total) by mouth daily. Freeman Caldron M, PA-C Taking Active   valsartan-hydrochlorothiazide (DIOVAN-HCT) 320-25 MG tablet [id number] Yes Take 1 tablet by mouth daily. Brooke Pier, MD Taking Active               Assessment/Plan:   Hypertension: - Currently controlled - Reviewed long term cardiovascular and renal outcomes of uncontrolled blood pressure - Reviewed appropriate blood pressure monitoring technique and reviewed goal blood pressure. Recommended to check home blood pressure and heart rate daily - Recommend to continue current regimen  Hyperlipidemia/ASCVD Risk Reduction: - Currently uncontrolled per last lipid panel - Reviewed long term complications of uncontrolled cholesterol - Reviewed dietary recommendations including reducing intake of red meats - Recommend to continue current regimen. Recommend updated lipid panel at next appointment with PCP and increase atorvastatin to 40 mg daily if LDL not at goal <70   Follow Up Plan: phone call in 4 weeks  [name], PharmD, [place] [phone]

## 2021-09-12 NOTE — Patient Instructions (Signed)
[name],   Keep up the great work!  Check your blood pressure once daily, and any time you have concerning symptoms like headache, chest pain, dizziness, shortness of breath, or vision changes.   Our goal is less than 130/80.  To appropriately check your blood pressure, make sure you do the following:  1) Avoid caffeine, exercise, or tobacco products for 30 minutes before checking. Empty your bladder. 2) Sit with your back supported in a flat-backed chair. Rest your arm on something flat (arm of the chair, table, etc). 3) Sit still with your feet flat on the floor, resting, for at least 5 minutes.  4) Check your blood pressure. Take 1-2 readings.  5) Write down these readings and bring with you to any provider appointments.  Bring your home blood pressure machine with you to a provider's office for accuracy comparison at least once a year.   Make sure you take your blood pressure medications before you come to any office visit, even if you were asked to fast for labs.  Take care!  [name], PharmD, [facility] [phone]

## 2021-09-29 ENCOUNTER — Ambulatory Visit
Admission: RE | Admit: 2021-09-29 | Discharge: 2021-09-29 | Disposition: A | Discharge status: Home / Self Care | Payer: Medicaid Other | Source: Ambulatory Visit | Attending: Internal Medicine | Admitting: Internal Medicine

## 2021-09-29 ENCOUNTER — Other Ambulatory Visit: Payer: Self-pay | Admitting: Internal Medicine

## 2021-09-29 DIAGNOSIS — J9859 Other diseases of mediastinum, not elsewhere classified: Secondary | ICD-10-CM

## 2021-09-29 DIAGNOSIS — J439 Emphysema, unspecified: Secondary | ICD-10-CM | POA: Diagnosis not present

## 2021-09-29 DIAGNOSIS — Z122 Encounter for screening for malignant neoplasm of respiratory organs: Secondary | ICD-10-CM

## 2021-09-29 DIAGNOSIS — R911 Solitary pulmonary nodule: Secondary | ICD-10-CM

## 2021-09-29 DIAGNOSIS — I700 Atherosclerosis of aorta: Secondary | ICD-10-CM | POA: Diagnosis not present

## 2021-09-29 HISTORY — DX: Other diseases of mediastinum, not elsewhere classified: J98.59

## 2021-10-02 ENCOUNTER — Telehealth: Payer: Self-pay | Admitting: Internal Medicine

## 2021-10-02 DIAGNOSIS — N2889 Other specified disorders of kidney and ureter: Secondary | ICD-10-CM

## 2021-10-02 DIAGNOSIS — J9859 Other diseases of mediastinum, not elsewhere classified: Secondary | ICD-10-CM

## 2021-10-02 NOTE — Telephone Encounter (Addendum)
Phone call placed to patient today to go over the results of the CAT scan of the chest.  Patient informed that the one lung nodule seen on previous CAT scan has remained stable in size which is good. -Incidental finding of a 1.7 cm lesion/mass in the upper left kidney.  Radiologist recommends MRI of the abdomen to define this further.  Patient agreeable with me ordering the MRI of the abdomen. -Again seen is a soft tissue mass in the upper left mediastinum/medial left pleural space that appears stable from imaging study done 05/2020.  I informed patient that the radiologist thinks this may be some type of nerve tumor or other type of tumor.  I had her see the CT surgeon Dr. Roxan Hockey for this last year and she was supposed to have followed up with him in a few months.  I recommend that we get her back to see him and she is agreeable to this. -Other incidental findings are three-vessel coronary atherosclerosis and aortic atherosclerosis.  She is plugged in with cardiology already and is on atorvastatin.  Patient tells me that a little over a week ago her blood pressure was pretty low.  She had to drink a little bit of water that had salt in it to get her blood pressure back up.  She does not recall the exact reading stating that she does not have a blood pressure machine with her that still is the numbers.  However since then she states that her numbers have been good.  Advised patient to keep a log of her numbers and bring them with her on her upcoming visit to see me next week.

## 2021-10-03 ENCOUNTER — Telehealth: Payer: Self-pay | Admitting: *Deleted

## 2021-10-03 NOTE — Telephone Encounter (Signed)
  Chief Complaint: Critical Results Symptoms: NA Frequency: NA Pertinent Negatives: Patient denies NA Disposition: '[]'$ ED /'[]'$ Urgent Care (no appt availability in office) / '[]'$ Appointment(In office/virtual)/ '[]'$  [facility]/ '[]'$ Home Care/ '[]'$ Refused Recommended Disposition /'[]'$ [facility]/ '[x]'$  Follow-up with PCP Additional Notes: Valarie Merino from [facility] calling with critical CT results. NT called [facility] urgent line, [place]mo [place]t, to alert.   MPRESSION: 1. Lung-RADS 2, benign appearance or behavior. Continue annual screening with low-dose chest CT without contrast in 12 months. 2. Indeterminate 3.2 x 1.6 cm soft tissue mass in the superior aspect of the posterior left mediastinum/medial left pleural space, stable from 06/09/2020 CT, differential includes neurogenic tumor versus solitary fibrous tumor of the pleura, as reported previously. 3. Indeterminate exophytic isodense 1.7 cm upper left renal cortical lesion, stable from 06/17/2020 CT, cannot exclude indolent renal neoplasm. MRI (preferred) or CT abdomen without and with IV contrast may be considered for further characterization at this time. 4. Three-vessel coronary atherosclerosis. 5. Aortic Atherosclerosis (ICD10-I70.0) and Emphysema (ICD10-J43.9

## 2021-10-04 NOTE — Telephone Encounter (Signed)
FYI

## 2021-10-04 NOTE — Telephone Encounter (Signed)
Call from [facility] noted.   I have already spoke with pt about her results.

## 2021-10-06 ENCOUNTER — Other Ambulatory Visit (HOSPITAL_COMMUNITY)
Admission: RE | Admit: 2021-10-06 | Discharge: 2021-10-06 | Disposition: A | Discharge status: Home / Self Care | Payer: Medicaid Other | Source: Ambulatory Visit | Attending: Internal Medicine | Admitting: Internal Medicine

## 2021-10-06 ENCOUNTER — Encounter: Payer: Self-pay | Admitting: Internal Medicine

## 2021-10-06 ENCOUNTER — Ambulatory Visit: Payer: Medicaid Other | Attending: Internal Medicine | Admitting: Internal Medicine

## 2021-10-06 VITALS — BP 134/92 | HR 74 | Temp 98.2°F | Ht 64.0 in | Wt 136.8 lb

## 2021-10-06 DIAGNOSIS — Z124 Encounter for screening for malignant neoplasm of cervix: Secondary | ICD-10-CM | POA: Insufficient documentation

## 2021-10-06 DIAGNOSIS — Z2821 Immunization not carried out because of patient refusal: Secondary | ICD-10-CM

## 2021-10-06 DIAGNOSIS — I10 Essential (primary) hypertension: Secondary | ICD-10-CM

## 2021-10-06 DIAGNOSIS — N289 Disorder of kidney and ureter, unspecified: Secondary | ICD-10-CM | POA: Diagnosis not present

## 2021-10-06 NOTE — Progress Notes (Signed)
Patient ID: Brooke Chapman, female    DOB: [birth year]  MRN: [id number]  CC: Gynecologic Exam   Subjective: Brooke Chapman is a 61 y.o. female who presents for pap.  Her significant other, Hedy Camara, is with her for part of hx. Her concerns today include:  Patient with history of PAD left leg, aortic atherosclerosis, emphysema changes seen on CT 05/2020, HTN, tob dep, HL, urge incont, IDA, RT LLL nodule and posterior mediastinal mass on CT 05/2020 (saw CT surgeon Dr. Roxan Hockey, questionably benign schwannoma.  Patient not wanting biopsy.  Plan to monitor with repeat imaging)   GYN History:  Pt is G0P0 Any hx of abn paps?: pt reports having an abn pap in past.  She was HPV positive 05/2019. Subtype neg Menses regular or irregular?: NA How long does menses last? NA Menstrual flow light or heavy?: NA Method of birth control?:  NA Any vaginal dischg at this time?:  no Dysuria?:  no Any hx of STI?:  long time ago Sexually active with how many partners:  no sex in 2 yrs Desires STI screen:  no Last MMG: 08/2021 Family hx of uterine, cervical or breast cancer?:  no  HTN:  did not take Norvasc and Valsartan/HCTZ as yet but did take Coreg. She reports no further symptomatic low BP episode since we spoke via phone a few days ago She has her home BP device with her.  Checks BP in mornings before meds. Recent readings 126/90 (this a.m), 92/62 (on 10/05/2021), 143/97, 157/105  She tells me she has been scheduled already for the MRI of the abdomen to further evaluate the lesion in her left kidney.  She also has already received appointment with CT surgeon Dr. Roxan Hockey for next month.    Patient Active Problem List   Diagnosis Date Noted   Coronary artery disease 06/15/2021   Chest pain of uncertain etiology 32/95/1884   Cardiac murmur 06/15/2021   Anemia 06/13/2021   Skin lesion 05/26/2021   GAD (generalized anxiety disorder) 05/01/2020   Black stools 01/13/2020   Unintended weight loss  01/13/2020   Antiplatelet or antithrombotic long-term use 01/13/2020   Hypertension    Frontal balding 12/28/2019   Iron deficiency anemia 12/28/2019   Essential hypertension    Porokeratosis 08/26/2018   PVD (peripheral vascular disease) ([place]) 08/26/2018   Hyperlipidemia 05/02/2018   HTN (hypertension) 05/01/2018   Tobacco dependence 05/01/2018   PAD (peripheral artery disease) ([place])      Current Outpatient Medications on File Prior to Visit  Medication Sig Dispense Refill   amLODipine (NORVASC) 10 MG tablet Take 1 tablet (10 mg total) by mouth daily. 30 tablet 3   aspirin 81 MG chewable tablet Chew 1 tablet (81 mg total) by mouth daily. 100 tablet 1   atorvastatin (LIPITOR) 20 MG tablet Take 1.5 tablets (30 mg total) by mouth daily. 135 tablet 3   Blood Pressure Monitor DEVI Use as directed to check home blood pressure 2-3 times a week 1 Device 0   carvedilol (COREG) 25 MG tablet Take 1 tablet (25 mg total) by mouth 2 (two) times daily. 180 tablet 3   cholecalciferol (VITAMIN D3) 25 MCG (1000 UT) tablet Take 1,000 Units by mouth daily.     cilostazol (PLETAL) 100 MG tablet Take 1 tablet (100 mg total) by mouth 2 (two) times daily. 180 tablet 3   ferrous sulfate 325 (65 FE) MG tablet Take 1 tablet (325 mg total) by mouth 2 (two) times daily with  a meal. 100 tablet 1   potassium chloride (KLOR-CON) 8 MEQ tablet Take 2 tablets (16 mEq total) by mouth daily. 180 tablet 1   valsartan-hydrochlorothiazide (DIOVAN-HCT) 320-25 MG tablet Take 1 tablet by mouth daily. 30 tablet 3   nitroGLYCERIN (NITROSTAT) 0.4 MG SL tablet Place 1 tablet (0.4 mg total) under the tongue every 5 (five) minutes as needed. 25 tablet 6   Current Facility-Administered Medications on File Prior to Visit  Medication Dose Route Frequency Provider Last Rate Last Admin   cloNIDine (CATAPRES) tablet 0.1 mg  0.1 mg Oral Once Argentina Donovan, PA-C        Allergies  Allergen Reactions   Latex Rash    Social History    Socioeconomic History   Marital status: Single    Spouse name: Not on file   Number of children: 0   Years of education: Not on file   Highest education level: Not on file  Occupational History   Occupation: Prep cook  Tobacco Use   Smoking status: Every Day    Packs/day: 0.50    Types: Cigarettes   Smokeless tobacco: Never   Tobacco comments:    6-10 cigs daily  Vaping Use   Vaping Use: Never used  Substance and Sexual Activity   Alcohol use: Yes    Comment: 1 per day   Drug use: Never   Sexual activity: Not Currently  Other Topics Concern   Not on file  Social History Narrative   Not on file   Social Determinants of Health   Financial Resource Strain: Low Risk  (06/27/2021)   Overall Financial Resource Strain (CARDIA)    Difficulty of Paying Living Expenses: Not hard at all  Food Insecurity: Not on file  Transportation Needs: Not on file  Physical Activity: Not on file  Stress: Not on file  Social Connections: Not on file  Intimate Partner Violence: Not on file    Family History  Problem Relation Age of Onset   Heart disease Mother    Hypertension Mother    Hyperlipidemia Mother    Hypertension Brother    Heart disease Maternal Aunt    Hypertension Maternal Aunt    Hyperlipidemia Maternal Aunt    Colon cancer Neg Hx    Liver cancer Neg Hx    Rectal cancer Neg Hx    Stomach cancer Neg Hx     Past Surgical History:  Procedure Laterality Date   ABDOMINAL AORTOGRAM W/LOWER EXTREMITY Bilateral 07/13/2019   Procedure: ABDOMINAL AORTOGRAM W/LOWER EXTREMITY;  Surgeon: Waynetta Sandy, MD;  Location: [facility];  Service: Cardiovascular;  Laterality: Bilateral;   BREAST EXCISIONAL BIOPSY Left    LESS THAN 5 YEARS AGO   PERIPHERAL VASCULAR INTERVENTION Left 07/13/2019   Procedure: PERIPHERAL VASCULAR INTERVENTION;  Surgeon: Waynetta Sandy, MD;  Location: [facility];  Service: Cardiovascular;  Laterality: Left;  common iliac     ROS: Review of Systems Negative except as stated above  PHYSICAL EXAM: BP (!) 134/92   Pulse 74   Temp 98.2 F (36.8 C) (Oral)   Ht '5\' 4"'$  (1.626 m)   Wt 136 lb 12.8 oz (62.1 kg)   SpO2 99%   BMI 23.48 kg/m   Wt Readings from Last 3 Encounters:  10/06/21 136 lb 12.8 oz (62.1 kg)  08/25/21 138 lb 12.8 oz (63 kg)  06/23/21 137 lb (62.1 kg)    Physical Exam  General appearance - alert, well appearing, and in  no distress Mental status - normal mood, behavior, speech, dress, motor activity, and thought processes Pelvic -CMA DeEtte Riki Sheer present:  normal external genitalia, vulva, vagina, cervix, uterus and adnexa.  She has some stenosis of the cervical os.  Uterus feels normal size.  No adnexal masses appreciated.      Latest Ref Rng & Units 03/02/2021    3:23 PM 02/02/2021    2:35 PM 11/09/2020    2:50 PM  CMP  Glucose 70 - 99 mg/dL 66  64  90   BUN 8 - 27 mg/dL '16  17  16   '$ Creatinine 0.57 - 1.00 mg/dL 0.99  1.26  1.14   Sodium 134 - 144 mmol/L 139  137  136   Potassium 3.5 - 5.2 mmol/L 3.9  3.8  4.4   Chloride 96 - 106 mmol/L 98  93  98   CO2 20 - 29 mmol/L 26  32  27   Calcium 8.7 - 10.3 mg/dL 9.0  9.6  8.9   Total Protein 6.0 - 8.5 g/dL 7.0  7.4  6.3   Total Bilirubin 0.0 - 1.2 mg/dL <0.2  0.2  <0.2   Alkaline Phos 44 - 121 IU/L 63  64  65   AST 0 - 40 IU/L '21  18  21   '$ ALT 0 - 32 IU/L '8  8  12    '$ Lipid Panel     Component Value Date/Time   CHOL 173 03/02/2021 1523   TRIG 105 03/02/2021 1523   HDL 58 03/02/2021 1523   CHOLHDL 3.0 03/02/2021 1523   LDLCALC 96 03/02/2021 1523    CBC    Component Value Date/Time   WBC 4.6 03/02/2021 1523   WBC 5.3 03/23/2020 0813   RBC 3.96 03/02/2021 1523   RBC 3.85 (L) 03/23/2020 0813   HGB 12.4 03/02/2021 1523   HCT 37.2 03/02/2021 1523   PLT 227 03/02/2021 1523   MCV 94 03/02/2021 1523   MCH 31.3 03/02/2021 1523   MCH 31.1 04/10/2018 1203   MCHC 33.3 03/02/2021 1523   MCHC 33.3 03/23/2020 0813   RDW 13.0  03/02/2021 1523   LYMPHSABS 2.3 03/02/2021 1523   EOSABS 0.1 03/02/2021 1523   BASOSABS 0.0 03/02/2021 1523    ASSESSMENT AND PLAN:  1. Pap smear for cervical cancer screening - Cytology - PAP  2. Essential hypertension Diastolic blood pressure not at goal.  However she has not taken all of her medicines as yet for the morning.  Blood pressure readings on her device seem to fluctuate widely.  Advised that she continues to check blood pressure daily and bring her device with her in a few weeks to see a clinical pharmacist for recheck of blood pressure.  No changes made today in medications.  Let me know if she has any further symptomatic low blood pressure episodes.  3. Kidney lesion, native, left Keep upcoming appointment for abdominal MRI.  4. Influenza vaccination declined Recommended.  Patient declined.    Patient was given the opportunity to ask questions.  Patient verbalized understanding of the plan and was able to repeat key elements of the plan.   This documentation was completed using Radio producer.  Any transcriptional errors are unintentional.  No orders of the defined types were placed in this encounter.    Requested Prescriptions    No prescriptions requested or ordered in this encounter    Return in about 4 months (around 02/05/2022) for Appt with [facility]  in 2 wks for BP check.  Karle Plumber, MD, FACP

## 2021-10-13 ENCOUNTER — Other Ambulatory Visit: Payer: Medicaid Other | Admitting: Pharmacist

## 2021-10-13 ENCOUNTER — Ambulatory Visit: Payer: Medicaid Other

## 2021-10-13 LAB — CYTOLOGY - PAP
Adequacy: ABSENT
Comment: NEGATIVE
Diagnosis: NEGATIVE
High risk HPV: NEGATIVE

## 2021-10-13 NOTE — Patient Instructions (Addendum)
Brooke Chapman,   If you continue to have dizziness/lighteadedness after taking your morning medications even when you eat, please call Brooke Chapman office - your blood pressure regimen may need to be adjusted.   The valsartan medication is good for your kidneys, so we would prefer to keep you on it, but maybe with a lower dose, than stop it altogether.   Continue to take home readings between now and when you see Brooke Chapman.   Check your blood pressure once daily, and any time you have concerning symptoms like headache, chest pain, dizziness, shortness of breath, or vision changes.   Our goal is less than 130/80.  To appropriately check your blood pressure, make sure you do the following:  1) Avoid caffeine, exercise, or tobacco products for 30 minutes before checking. Empty your bladder. 2) Sit with your back supported in a flat-backed chair. Rest your arm on something flat (arm of the chair, table, etc). 3) Sit still with your feet flat on the floor, resting, for at least 5 minutes.  4) Check your blood pressure. Take 1-2 readings.  5) Write down these readings and bring with you to any provider appointments.  Bring your home blood pressure machine with you to a provider's office for accuracy comparison at least once a year.   Make sure you take your blood pressure medications before you come to any office visit, even if you were asked to fast for labs.  Take care!  Brooke Chapman, PharmD, [facility] [phone]

## 2021-10-13 NOTE — Progress Notes (Signed)
Chief Complaint  Patient presents with   Hypertension   Medication Management   Hyperlipidemia    Brooke Chapman is a 61 y.o. year old female who presented for a telephone visit.   They were referred to the pharmacist by a quality report for assistance in managing hypertension.   Patient is participating in a Managed Medicaid Plan:  Yes  Subjective:  Care Team: Primary Care Provider: Ladell Pier, MD ; Next Scheduled Visit: 11/10/21 with embedded pharmacist  Medication Access/Adherence  Current Pharmacy:  [facility], [place] [address] Phone: [phone] Fax: [phone]   Patient reports affordability concerns with their medications: No  Patient reports access/transportation concerns to their pharmacy: No  Patient reports adherence concerns with their medications:  No     Hypertension:  Current medications: amlodipine 10 mg daily every evening, valsartan/HCTZ 320/25 mg daily, carvedilol 25 mg BID  Patient has a validated, automated, upper arm home BP cuff Current blood pressure readings readings: 100/66, HR 66 today, ~ 2 hours after taking valsartan/HCTZ 320/25 mg and morning carvedilol  Patient reports hypotensive s/sx including dizziness, lightheadedness if she takes her morning medications on an empty stomach.  Patient denies hypertensive symptoms including headache, chest pain, shortness of breath  Does report pain in her foot (calluses on R foot and she puts weight on her L leg). Reports she plans to see a new podiatrist moving forward that to discuss any procedures that could help   Hyperlipidemia/ASCVD Risk Reduction  Current lipid lowering medications: atorvastatin 30 mg daily  Antiplatelet regimen: aspirin 81 mg  ASCVD History: PAD    Health Maintenance  Health Maintenance Due  Topic Date Due   COVID-19 Vaccine (1) Never done   Zoster Vaccines- Shingrix (1 of 2)  Never done   PAP SMEAR-Modifier  06/21/2020     Objective: Lab Results  Component Value Date   HGBA1C 5.3 (A) 04/29/2020    Lab Results  Component Value Date   CREATININE 0.99 03/02/2021   BUN 16 03/02/2021   NA 139 03/02/2021   K 3.9 03/02/2021   CL 98 03/02/2021   CO2 26 03/02/2021    Lab Results  Component Value Date   CHOL 173 03/02/2021   HDL 58 03/02/2021   LDLCALC 96 03/02/2021   TRIG 105 03/02/2021   CHOLHDL 3.0 03/02/2021    Medications Reviewed Today     Reviewed by Osker Mason, RPH-CPP (Pharmacist) on 10/13/21 at [phone]  Med List Status: <None>   Medication Order Taking? Sig Documenting Provider Last Dose Status Informant  amLODipine (NORVASC) 10 MG tablet [id number] Yes Take 1 tablet (10 mg total) by mouth daily. Ladell Pier, MD Taking Active   aspirin 81 MG chewable tablet [id number]  Chew 1 tablet (81 mg total) by mouth daily. Freeman Caldron M, PA-C  Active   atorvastatin (LIPITOR) 20 MG tablet [id number]  Take 1.5 tablets (30 mg total) by mouth daily. Mathis Dad  Active   Blood Pressure Monitor DEVI [id number]  Use as directed to check home blood pressure 2-3 times a week Ladell Pier, MD  Active Self  carvedilol (COREG) 25 MG tablet [id number] Yes Take 1 tablet (25 mg total) by mouth 2 (two) times daily. Freeman Caldron M, PA-C Taking Active   cholecalciferol (VITAMIN D3) 25 MCG (1000 UT) tablet [id number]  Take 1,000 Units by mouth daily. [provider]  Active Self  cilostazol (PLETAL) 100 MG tablet [id number]  Take 1 tablet (100 mg total) by mouth 2 (two) times daily. Freeman Caldron M, [place]  Active   cloNIDine (CATAPRES) tablet 0.1 mg [id number]   Argentina Donovan, PA-C  Active   ferrous sulfate 325 (65 FE) MG tablet [id number]  Take 1 tablet (325 mg total) by mouth 2 (two) times daily with a meal. Ladell Pier, MD  Active            Med Note Jodi Mourning, Dow Blahnik T   Tue Sep 12, 2021  9:21 AM) Taking once weekly   nitroGLYCERIN (NITROSTAT) 0.4 MG SL tablet [id number]  Place 1 tablet (0.4 mg total) under the tongue every 5 (five) minutes as needed. RevankarReita Cliche, MD  Expired 09/13/21 2359   potassium chloride (KLOR-CON) 8 MEQ tablet [id number]  Take 2 tablets (16 mEq total) by mouth daily. Freeman Caldron M, PA-C  Active   valsartan-hydrochlorothiazide (DIOVAN-HCT) 320-25 MG tablet [id number] Yes Take 1 tablet by mouth daily. Ladell Pier, MD Taking Active               Assessment/Plan:   Hypertension: - Currently controlled - Reviewed long term cardiovascular and renal outcomes of uncontrolled blood pressure - Reviewed appropriate blood pressure monitoring technique and reviewed goal blood pressure. Recommended to check home blood pressure and heart rate twice daily between now and next appointment with embedded pharmacist. Encouraged to continue to monitor for any patterns of lightheadedness/dizziness to determine if any medication adjustments are needed.    Hyperlipidemia/ASCVD Risk Reduction: - Currently uncontrolled.  - Recommend to obtain updated lipid panel. Recommend increasing atorvastatin to target LDL <70, possibly <55 given PVD     Follow Up Plan: embedded pharmacist in 4 weeks  [name], PharmD, [facility] [phone]

## 2021-10-16 ENCOUNTER — Encounter: Payer: Medicaid Other | Admitting: Thoracic Surgery (Cardiothoracic Vascular Surgery)

## 2021-10-16 NOTE — Progress Notes (Signed)
Good Morning Ms. Howk   Dr. Wynetta Emery wanted me to inform you that your Pap smear did not reveal any cancerous cells.   However we will need to repeat the Pap smear in 1 year as we did not get an adequate sampling of the cells around the cervix.  If you have any questions please feel free to reach out.  Have a [place] Day!!!

## 2021-10-19 ENCOUNTER — Inpatient Hospital Stay: Admission: RE | Admit: 2021-10-19 | Payer: Medicaid Other | Source: Ambulatory Visit

## 2021-11-03 ENCOUNTER — Ambulatory Visit
Admission: RE | Admit: 2021-11-03 | Discharge: 2021-11-03 | Disposition: A | Discharge status: Home / Self Care | Payer: Medicaid Other | Source: Ambulatory Visit | Attending: Internal Medicine | Admitting: Internal Medicine

## 2021-11-03 DIAGNOSIS — N2889 Other specified disorders of kidney and ureter: Secondary | ICD-10-CM

## 2021-11-03 DIAGNOSIS — N261 Atrophy of kidney (terminal): Secondary | ICD-10-CM | POA: Diagnosis not present

## 2021-11-03 DIAGNOSIS — N281 Cyst of kidney, acquired: Secondary | ICD-10-CM | POA: Diagnosis not present

## 2021-11-03 DIAGNOSIS — I700 Atherosclerosis of aorta: Secondary | ICD-10-CM | POA: Diagnosis not present

## 2021-11-03 MED ORDER — GADOBENATE DIMEGLUMINE 529 MG/ML IV SOLN
13.0000 mL | Freq: Once | INTRAVENOUS | Status: AC | PRN
  Administered 2021-11-03: 13 mL via INTRAVENOUS

## 2021-11-05 ENCOUNTER — Telehealth: Payer: Self-pay | Admitting: Internal Medicine

## 2021-11-05 DIAGNOSIS — N289 Disorder of kidney and ureter, unspecified: Secondary | ICD-10-CM

## 2021-11-05 NOTE — Telephone Encounter (Signed)
Phone call placed to patient this morning to go over the results of the MRI of the abdomen that was done to evaluate a lesion in the upper pole of the left kidney.  Patient informed that the radiologist states that the lesion appears to be close to this medial aspect of the spleen, superior pole of the left kidney and the left adrenal.  It does not enhance on imaging and appears of uncertain nature although most certainly benign given establish stability and lack of contrast-enhancement.  Small lesion also seen in the liver that is thought to be benign.  Incidental finding of atrophic right kidney thought to be due to either prior infection, obstruction or ischemic insult. Advised patient that I will still send her to see a urologist to take a look at the CAT scan and MRI images just for second opinion.  Patient is agreeable to this.

## 2021-11-06 DIAGNOSIS — M7989 Other specified soft tissue disorders: Secondary | ICD-10-CM | POA: Diagnosis not present

## 2021-11-06 DIAGNOSIS — M2011 Hallux valgus (acquired), right foot: Secondary | ICD-10-CM | POA: Diagnosis not present

## 2021-11-10 ENCOUNTER — Encounter: Payer: Self-pay | Admitting: Pharmacist

## 2021-11-10 ENCOUNTER — Ambulatory Visit: Payer: Medicaid Other | Attending: Internal Medicine | Admitting: Pharmacist

## 2021-11-10 VITALS — BP 108/71 | HR 62

## 2021-11-10 DIAGNOSIS — Z131 Encounter for screening for diabetes mellitus: Secondary | ICD-10-CM | POA: Diagnosis not present

## 2021-11-10 DIAGNOSIS — I10 Essential (primary) hypertension: Secondary | ICD-10-CM

## 2021-11-10 MED ORDER — VALSARTAN-HYDROCHLOROTHIAZIDE 320-25 MG PO TABS: 1.0000 | ORAL_TABLET | Freq: Every day | ORAL | 1 refills | Status: DC

## 2021-11-10 NOTE — Progress Notes (Signed)
   S:    PCP: Dr. Wynetta Emery  Patient arrives in good spirits. Presents to the clinic for hypertension , counseling, and management. Patient was referred and last seen by Dr. Wynetta Emery 10/06/2021. BP was closer to goal at that appt (134/92 mmHg) compared to previous appts. However, she had not taken all of her BP medication the day of the appt.  Today, she reports medication adherence. She took all of her antihypertensives prior to this appointment.   No chest pain, dyspnea, HA, blurred vision.   Current BP Medications include:  amlodipine 10 mg daily, carvedilol 25 mg BID, valsartan-HCTZ 320-25 mg daily  Dietary habits include: doing better with salt restriction. Drinks coffee in the mornings. Works in [employer]. Since last visit, pt tells me she has increased her salad intake.  Exercise habits include: none  Family / Social history:  Fhx: HLD, HTN, heart disease  Tobacco: 0.5 PPD smoker  Alcohol: 1 drink standard per today   O:  Vitals:   11/10/21 0925  BP: 108/71  Pulse: 62    Last 3 Office BP readings: BP Readings from Last 3 Encounters:  11/10/21 108/71  10/13/21 100/66  10/06/21 (!) 134/92    BMET    Component Value Date/Time   NA 139 03/02/2021 1523   K 3.9 03/02/2021 1523   CL 98 03/02/2021 1523   CO2 26 03/02/2021 1523   GLUCOSE 66 (L) 03/02/2021 1523   GLUCOSE 88 07/13/2019 0740   BUN 16 03/02/2021 1523   CREATININE 0.99 03/02/2021 1523   CALCIUM 9.0 03/02/2021 1523   GFRNONAA 68 05/11/2019 1534   GFRAA 79 05/11/2019 1534    Renal function: CrCl cannot be calculated (Patient's most recent lab result is older than the maximum 21 days allowed.).  Clinical ASCVD: PAD   A/P: Hypertension diagnosed currently at goal and drastically improved since January. BP Goal = < 130/80 mmHg. Medication adherence reported. Of note, she requests labs today and would like to be screened for DM. She endorses increased numbness in her L foot. Of note, she does have a hx of PAD. I  explained that this is likely the cause but we can screen today. -Continued current medication regimen given improvement.  -Counseled on lifestyle modifications for blood pressure control including reduced dietary sodium, increased exercise, adequate sleep. -CMP14+eGFR, A1c  Results reviewed and written information provided.   Total time in face-to-face counseling 30 minutes.   F/U Clinic Visit in Jan with PCP.  Benard Halsted, PharmD, [facility], [place] [phone]

## 2021-11-11 ENCOUNTER — Encounter: Payer: Self-pay | Admitting: Internal Medicine

## 2021-11-11 DIAGNOSIS — N183 Chronic kidney disease, stage 3 unspecified: Secondary | ICD-10-CM | POA: Insufficient documentation

## 2021-11-11 LAB — CMP14+EGFR
ALT: 12 IU/L (ref 0–32)
AST: 22 IU/L (ref 0–40)
Albumin/Globulin Ratio: 1.5 (ref 1.2–2.2)
Albumin: 4.3 g/dL (ref 3.9–4.9)
Alkaline Phosphatase: 56 IU/L (ref 44–121)
BUN/Creatinine Ratio: 16 (ref 12–28)
BUN: 23 mg/dL (ref 8–27)
Bilirubin Total: <0.2 mg/dL (ref 0.0–1.2)
CO2: 28 mmol/L (ref 20–29)
Calcium: 9.4 mg/dL (ref 8.7–10.3)
Chloride: 99 mmol/L (ref 96–106)
Creatinine, Ser: 1.41 mg/dL — ABNORMAL HIGH (ref 0.57–1.00)
Globulin, Total: 2.8 g/dL (ref 1.5–4.5)
Glucose: 66 mg/dL — ABNORMAL LOW (ref 70–99)
Potassium: 4.7 mmol/L (ref 3.5–5.2)
Sodium: 138 mmol/L (ref 134–144)
Total Protein: 7.1 g/dL (ref 6.0–8.5)
eGFR: 42 mL/min/{1.73_m2} — ABNORMAL LOW (ref >59)

## 2021-11-11 LAB — HEMOGLOBIN A1C
Est. average glucose Bld gHb Est-mCnc: 117 mg/dL
Hgb A1c MFr Bld: 5.7 % — ABNORMAL HIGH (ref 4.8–5.6)

## 2021-11-11 NOTE — Progress Notes (Signed)
Let patient know that her kidney function is not at 100% and is worse compared to when last checked in February.  We will continue to monitor.  Make sure that she drinks several glasses of water daily.  We will plan to recheck on subsequent visit and make a decision about whether she needs to see a kidney specialist. She is in the range for prediabetes exercise will prevent progression to diabetes.

## 2021-11-14 ENCOUNTER — Encounter: Payer: Medicaid Other | Admitting: Thoracic Surgery (Cardiothoracic Vascular Surgery)

## 2021-12-12 ENCOUNTER — Encounter: Payer: Self-pay | Admitting: Thoracic Surgery (Cardiothoracic Vascular Surgery)

## 2021-12-12 ENCOUNTER — Encounter: Payer: Self-pay | Admitting: *Deleted

## 2021-12-12 ENCOUNTER — Institutional Professional Consult (permissible substitution): Payer: Medicaid Other | Admitting: Thoracic Surgery (Cardiothoracic Vascular Surgery)

## 2021-12-12 VITALS — BP 126/84 | HR 70 | Resp 18 | Ht 64.0 in | Wt 141.0 lb

## 2021-12-12 DIAGNOSIS — J9859 Other diseases of mediastinum, not elsewhere classified: Secondary | ICD-10-CM | POA: Diagnosis not present

## 2021-12-12 NOTE — Progress Notes (Signed)
[address]       ,[place] [address]             [phone]      HPI: Brooke Chapman returns for a scheduled follow-up visit regarding a posterior mediastinal mass.  Brooke Chapman is a 61 year old woman with a past history significant for tobacco abuse, hypertension, hyperlipidemia, PAD, anemia, posterior mediastinal mass, stage III CKD, heart murmur, and CAD.  She had a Chapman-dose CT for lung cancer screening about a year and a half ago.  There were no suspicious lung nodules but there was a smoothly marginated posterior mediastinal mass high on the left side.  Felt to be most likely a schwannoma.  I recommended resection but she preferred radiographic observation.  She was supposed to return in 3 months but did not.  She recently had a follow-up screening CT.  There was no change in the posterior mediastinal mass.  She did have a lesion on the kidney.  That was followed up with an MRI that showed that was likely benign.  There also was a liver lesion that was felt to be benign as well.  She has no history of intercostal neuralgia type pain.  She did have an episode of chest tightness a couple of weeks ago.  It was not exertional.  Lasted a few minutes.  Has not recurred since then.  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 30 tablet 3   aspirin 81 MG chewable tablet Chew 1 tablet (81 mg total) by mouth daily. 100 tablet 1   atorvastatin (LIPITOR) 20 MG tablet Take 1.5 tablets (30 mg total) by mouth daily. 135 tablet 3   Blood Pressure Monitor DEVI Use as directed to check home blood pressure 2-3 times a week 1 Device 0   carvedilol (COREG) 25 MG tablet Take 1 tablet (25 mg total) by mouth 2 (two) times daily. 180 tablet 3   cholecalciferol (VITAMIN D3) 25 MCG (1000 UT) tablet Take 1,000 Units by mouth daily.     cilostazol (PLETAL) 100 MG tablet Take 1 tablet (100 mg total) by mouth 2 (two) times daily. 180 tablet 3   ferrous sulfate 325 (65 FE) MG tablet Take 1 tablet (325 mg total) by mouth 2 (two) times daily with a meal. 100 tablet 1   potassium chloride (KLOR-CON) 8 MEQ tablet Take 2 tablets (16 mEq total) by mouth daily. 180 tablet 1   valsartan-hydrochlorothiazide (DIOVAN-HCT) 320-25 MG tablet Take 1 tablet by mouth daily. 90 tablet 1   nitroGLYCERIN (NITROSTAT) 0.4 MG SL tablet Place 1 tablet (0.4 mg total) under the tongue every 5 (five) minutes as needed. 25 tablet 6   Current Facility-Administered Medications  Medication Dose Route Frequency Provider Last Rate Last Admin   cloNIDine (CATAPRES) tablet 0.1  mg  0.1 mg Oral Once Mathis Dad        Physical Exam BP 126/84 (BP Location: Left Arm, Patient Position: Sitting)   Pulse 70   Resp 18   Ht '5\' 4"'$  (1.626 m)   Wt 141 lb (64 kg)   SpO2 99% Comment: RA  BMI 24.32 kg/m  61 year old woman in no acute distress Alert and oriented x3 with no focal deficits No cervical supraclavicular adenopathy Cardiac regular rate and rhythm with a 2/6 systolic murmur Lungs clear bilaterally  Diagnostic Tests: CT CHEST WITHOUT CONTRAST FOR LUNG CANCER SCREENING NODULE FOLLOW-UP   TECHNIQUE: Multidetector CT imaging of the chest was performed following the standard protocol without IV contrast.   RADIATION DOSE REDUCTION: This exam was  performed according to the departmental dose-optimization program which includes automated exposure control, adjustment of the mA and/or kV according to patient size and/or use of iterative reconstruction technique.   COMPARISON:  06/09/2020 screening chest CT.   FINDINGS: Cardiovascular: Normal heart size. No significant pericardial effusion/thickening. Three-vessel coronary atherosclerosis. Atherosclerotic nonaneurysmal thoracic aorta. Normal caliber pulmonary arteries.   Mediastinum/Nodes: No discrete thyroid nodules. Unremarkable esophagus. No pathologically enlarged axillary, mediastinal or hilar lymph nodes, noting limited sensitivity for the detection of hilar adenopathy on this noncontrast study. In the posterior superior left mediastinum/pleural space, there is a 3.2 x 1.6 cm soft tissue mass (series 2/image 12), previously 3.2 x 1.7 cm using similar measurement technique, not appreciably changed.   Lungs/Pleura: No pneumothorax. No pleural effusion. Mild paraseptal and centrilobular emphysema with diffuse bronchial wall thickening. No acute consolidative airspace disease or lung masses. No significant growth of previously visualized small right lower lobe pulmonary nodule. No new significant pulmonary nodules.   Upper abdomen: Exophytic isodense 1.7 cm upper left renal cortical lesion (series 2/image 50), stable. Simple 1.4 cm posterior interpolar left renal cyst, for which no follow-up imaging is recommended. Asymmetric right renal atrophy.   Musculoskeletal: No aggressive appearing focal osseous lesions. Mild thoracic spondylosis.   IMPRESSION: 1. Lung-RADS 2, benign appearance or behavior. Continue annual screening with Chapman-dose chest CT without contrast in 12 months. 2. Indeterminate 3.2 x 1.6 cm soft tissue mass in the superior aspect of the posterior left mediastinum/medial left pleural space, stable from 06/09/2020 CT, differential includes neurogenic  tumor versus solitary fibrous tumor of the pleura, as reported previously. 3. Indeterminate exophytic isodense 1.7 cm upper left renal cortical lesion, stable from 06/17/2020 CT, cannot exclude indolent renal neoplasm. MRI (preferred) or CT abdomen without and with IV contrast may be considered for further characterization at this time. 4. Three-vessel coronary atherosclerosis. 5. Aortic Atherosclerosis (ICD10-I70.0) and Emphysema (ICD10-J43.9).   These results will be called to the ordering clinician or representative by the Radiologist Assistant, and communication documented in the PACS or [employer].     Electronically Signed   By: Ilona Sorrel M.D.   On: 09/30/2021 17:55 I personally reviewed the CT images.  There has been no change from the left superior posterior mediastinal mass.  Impression: Brooke Chapman is a 61 year old woman with a past history significant for tobacco abuse, hypertension, hyperlipidemia, PAD, anemia, posterior mediastinal mass, stage III CKD, heart murmur, and CAD.    Posterior mediastinal mass-unchanged over almost a year and a half.  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  Solitary fibrous tumor of the pleura is also in the differential.  Extremely unlikely to be malignant based on the lack of growth over nearly 18 months.  Resection is an option.  Also reasonable to continue with radiographic follow-up and that is her preference.  Tobacco abuse-no suspicious lung nodules based on screening CT.  Needs continued annual screening.   Plan: Return in 1 year with CT chest.  I spent 20 minutes in review of records, images, and in consultation with Brooke Chapman today.  Melrose Nakayama, MD [facility] [phone]

## 2021-12-14 ENCOUNTER — Other Ambulatory Visit: Payer: Self-pay | Admitting: *Deleted

## 2021-12-14 DIAGNOSIS — I739 Peripheral vascular disease, unspecified: Secondary | ICD-10-CM

## 2021-12-15 ENCOUNTER — Ambulatory Visit: Payer: Medicaid Other | Admitting: Podiatry

## 2021-12-15 DIAGNOSIS — D689 Coagulation defect, unspecified: Secondary | ICD-10-CM | POA: Diagnosis not present

## 2021-12-15 DIAGNOSIS — D492 Neoplasm of unspecified behavior of bone, soft tissue, and skin: Secondary | ICD-10-CM

## 2021-12-15 DIAGNOSIS — I739 Peripheral vascular disease, unspecified: Secondary | ICD-10-CM | POA: Diagnosis not present

## 2021-12-17 NOTE — Progress Notes (Signed)
Subjective: Chief Complaint  Patient presents with   Callouses    Patient has been treated for porokeratosis in the past by Dr. Prudence Davidson and most recently by Dr. Sherryle Lis. Patient reports pain and discomfort in the right hallux. Wants to discuss surgery options    61 year old female presents the office today with above concerns.  She said the calluses been ongoing for quite some time she wants to have them cut out completely.  She does not want them trimmed today.  She is not reporting any open lesions or any drainage.  Objective: AAO x3, NAD DP, PT pulses decreased bilaterally. Thick annular hyperkeratotic lesions noted submetatarsal 5, submetatarsal hallux as well as plantar heel bilaterally.  There is no underlying ulceration drainage or any signs of infection. No pain with calf compression, swelling, warmth, erythema  Assessment: 61 year old female with hyperkeratotic lesions, PAD  Plan: -All treatment options discussed with the patient including all alternatives, risks, complications.  -Patient does not desire to have them trimmed today.  Given her circulatory status and smoking I would advise against any surgical intervention that would cause an open wound.  Discussed with her that if we were to do this for potential consequences of this.  I did recommend moisturizer daily and I also gave her prescription for orthotics for [facility] to see if we can offload these areas as well.  However would need to also monitor for any skin breakdown or signs of infection.  Discussed daily foot inspection. -Patient encouraged to call the office with any questions, concerns, change in symptoms.   Trula Slade DPM

## 2021-12-20 NOTE — Progress Notes (Signed)
HISTORY AND PHYSICAL     CC:  follow up. Requesting Provider:  Ladell Pier, MD  HPI: This is a 61 y.o. female who is here today for follow up for PAD.  Pt has hx of angiogram with stent of the left CIA by Dr. Donzetta Matters on 07/13/2019.    Pt was last seen 08/26/2020 and at that time, she did have some calluses that were causing more pain than claudication.  She was not walking much throughout the day. She did not have any stenosis or occlusion of the left CIA stent.  The pt returns today for follow up.  She states that she has calluses on her feet that are painful and interfering with her daily lifestyle.  She had seen Dr. Jacqualyn Posey with podiatry and he would not operate on her calluses due to her ABI.  She states the right foot is worse and having pain in her left hip from compensating due to the pain.  She denies any claudication.  She does not really describe classic rest pain.  She continues to smoke but has been working on quitting.    The pt is on a statin for cholesterol management.    The pt is on an aspirin.    Other AC:  none The pt is on CCB, BB, ARB, HCTZ for hypertension.  The pt does not have diabetes. Tobacco hx:  current  Pt does not have family hx of AAA.  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;  Service: Cardiovascular;  Laterality: Bilateral;   BREAST EXCISIONAL BIOPSY Left    LESS THAN 5 YEARS AGO   PERIPHERAL VASCULAR INTERVENTION Left 07/13/2019   Procedure: PERIPHERAL VASCULAR INTERVENTION;  Surgeon: Waynetta Sandy, MD;  Location: [facility];  Service: Cardiovascular;  Laterality: Left;  common iliac    Allergies  Allergen Reactions   Latex Rash    Current Outpatient Medications  Medication Sig Dispense Refill   amLODipine (NORVASC) 10 MG tablet Take 1 tablet (10 mg total) by mouth daily. 30 tablet 3   aspirin 81 MG chewable tablet Chew 1 tablet (81 mg total) by mouth daily. 100 tablet 1   atorvastatin (LIPITOR) 20 MG tablet Take 1.5 tablets (30 mg total) by mouth daily. 135 tablet 3   Blood Pressure Monitor DEVI Use as directed to check home blood pressure 2-3 times a week 1 Device 0   carvedilol (COREG) 25 MG tablet Take 1 tablet (25 mg total) by mouth 2 (two) times daily. 180 tablet 3   cholecalciferol (VITAMIN D3) 25 MCG (1000 UT) tablet Take 1,000 Units by mouth daily.     cilostazol (PLETAL) 100 MG tablet Take 1 tablet (100 mg total) by mouth 2 (two) times daily. 180 tablet 3  ferrous sulfate 325 (65 FE) MG tablet Take 1 tablet (325 mg total) by mouth 2 (two) times daily with a meal. 100 tablet 1   nitroGLYCERIN (NITROSTAT) 0.4 MG SL tablet Place 1 tablet (0.4 mg total) under the tongue every 5 (five) minutes as needed. 25 tablet 6   potassium chloride (KLOR-CON) 8 MEQ tablet Take 2 tablets (16 mEq total) by mouth daily. 180 tablet 1   valsartan-hydrochlorothiazide (DIOVAN-HCT) 320-25 MG tablet Take 1 tablet by mouth daily. 90 tablet 1   Current Facility-Administered Medications  Medication Dose Route Frequency Provider Last Rate Last Admin   cloNIDine (CATAPRES) tablet 0.1 mg  0.1 mg Oral Once Argentina Donovan, PA-C        Family History  Problem Relation Age of Onset    Heart disease Mother    Hypertension Mother    Hyperlipidemia Mother    Hypertension Brother    Heart disease Maternal Aunt    Hypertension Maternal Aunt    Hyperlipidemia Maternal Aunt    Colon cancer Neg Hx    Liver cancer Neg Hx    Rectal cancer Neg Hx    Stomach cancer Neg Hx     Social History   Socioeconomic History   Marital status: Single    Spouse name: Not on file   Number of children: 0   Years of education: Not on file   Highest education level: Not on file  Occupational History   Occupation: Prep cook  Tobacco Use   Smoking status: Every Day    Packs/day: 0.50    Types: Cigarettes   Smokeless tobacco: Never   Tobacco comments:    6-10 cigs daily  Vaping Use   Vaping Use: Never used  Substance and Sexual Activity   Alcohol use: Yes    Comment: 1 per day   Drug use: Never   Sexual activity: Not Currently  Other Topics Concern   Not on file  Social History Narrative   Not on file   Social Determinants of Health   Financial Resource Strain: Low Risk  (06/27/2021)   Overall Financial Resource Strain (CARDIA)    Difficulty of Paying Living Expenses: Not hard at all  Food Insecurity: Not on file  Transportation Needs: Not on file  Physical Activity: Not on file  Stress: Not on file  Social Connections: Not on file  Intimate Partner Violence: Not on file     REVIEW OF SYSTEMS:   '[X]'$  denotes positive finding, '[ ]'$  denotes negative finding Cardiac  Comments:  Chest pain or chest pressure:    Shortness of breath upon exertion:    Short of breath when lying flat:    Irregular heart rhythm:        Vascular    Pain in calf, thigh, or hip brought on by ambulation:    Pain in feet at night that wakes you up from your sleep:     Blood clot in your veins:    Leg swelling:         Pulmonary    Oxygen at home:    Productive cough:     Wheezing:         Neurologic    Sudden weakness in arms or legs:     Sudden numbness in arms or legs:     Sudden  onset of difficulty speaking or slurred speech:    Temporary loss of vision in one eye:     Problems with dizziness:  Gastrointestinal    Blood in stool:     Vomited blood:         Genitourinary    Burning when urinating:     Blood in urine:        Psychiatric    Major depression:         Hematologic    Bleeding problems:    Problems with blood clotting too easily:        Skin    Rashes or ulcers: x See HPI      Constitutional    Fever or chills:      PHYSICAL EXAMINATION:  Today's Vitals   12/28/21 0926  BP: (!) 159/103  Pulse: 100  Resp: 20  Temp: 98.2 F (36.8 C)  TempSrc: Temporal  SpO2: 98%  Weight: 138 lb 9.6 oz (62.9 kg)  Height: '5\' 4"'$  (1.626 m)   Body mass index is 23.79 kg/m.;   General:  WDWN in NAD; vital signs documented above Gait: Not observed HENT: WNL, normocephalic Pulmonary: normal non-labored breathing , without wheezing Cardiac: regular HR, without carotid bruits Abdomen: soft, NT; aortic pulse is not palpable Skin: without rashes Vascular Exam/Pulses:  Right Left  Radial 2+ (normal) 2+ (normal)  Femoral 2+ (normal) 2+ (normal)  Popliteal Unable to palpate Unable to palpate  DP Faint monophasic doppler signal Monophasic doppler  PT Faint monophasic doppler Monophasic doppler  Peroneal Faint monophasic doppler Unable to obtain   Extremities: without ischemic changes, without Gangrene , without cellulitis; without open wounds; calluses present bilateral feet Right foot:   Left foot:  Musculoskeletal: no muscle wasting or atrophy  Neurologic: A&O X 3 Psychiatric:  The pt has Normal affect.   Non-Invasive Vascular Imaging:   ABI's/TBI's on 12/28/2021: Right:  0.63/0.34 - Great toe pressure: 50 Left:  0.45/0.35 - Great toe pressure: 52  Arterial duplex on 12/28/2021: Abdominal Aorta Findings:  +-----------+-------+----------+----------+---------+--------+--------+  Location  AP (cm)Trans (cm)PSV (cm/s)Waveform  ThrombusComments  +-----------+-------+----------+----------+---------+--------+--------+  Proximal  2.37   2.48      48        triphasic                  +-----------+-------+----------+----------+---------+--------+--------+  Mid       1.62   1.89      33        triphasic                  +-----------+-------+----------+----------+---------+--------+--------+  Distal    1.51   1.51      51        triphasic                  +-----------+-------+----------+----------+---------+--------+--------+  RT CIA Prox0.8    0.7       106       triphasic                  +-----------+-------+----------+----------+---------+--------+--------+   Left Stent(s):  +---------------+--------+--------------+----------+---------+  Left CIA       PSV cm/sStenosis      Waveform  Comments   +---------------+--------+--------------+----------+---------+  Prox to Stent  51                    triphasic Distal Ao  +---------------+--------+--------------+----------+---------+  Proximal Stent 61                    triphasic            +---------------+--------+--------------+----------+---------+  Mid Stent  112     1-49% stenosistriphasic            +---------------+--------+--------------+----------+---------+  Distal Stent   63                    monophasic           +---------------+--------+--------------+----------+---------+  Distal to Stent90                    monophasic           +---------------+--------+--------------+----------+---------+   Previous ABI's/TBI's on 08/26/2020: Right:  0.58/0.39 - Great toe pressure: 64 Left:  0.60/0.45 - Great toe pressure:  74  Previous arterial duplex on 08/26/2020: Summary:  Abdominal Aorta: Aorta/ Iliac: no evidence of stenosis or occlusion.  Patent Left CIA stent.     ASSESSMENT/PLAN:: 61 y.o. female here for follow up for PAD with hx of angiogram with stent of the left CIA by Dr. Donzetta Matters  on 07/13/2019.  PAD -pt with moderately decreased ABI.  Her left CIA stent is patent with minimal stenosis.   She does have painful calluses that the podiatrist will not work on due to her PAD. After discussion with pt and Dr. Donzetta Matters, will plan for angiogram with emphasis on the right leg first with possible intervention.  -continue asa/statin  Current smoker -discussed importance of smoking cessation and with her PAD, she is at increased risk for heart disease, stroke, limb loss.  She does also have family hx of heart disease.  She will work on smoking cessation.     Leontine Locket, [facility] [phone]  Clinic MD:   Donzetta Matters

## 2021-12-28 ENCOUNTER — Ambulatory Visit (HOSPITAL_COMMUNITY)
Admission: RE | Admit: 2021-12-28 | Discharge: 2021-12-28 | Disposition: A | Discharge status: Home / Self Care | Payer: Medicaid Other | Source: Ambulatory Visit | Attending: Vascular Surgery | Admitting: Vascular Surgery

## 2021-12-28 ENCOUNTER — Ambulatory Visit (INDEPENDENT_AMBULATORY_CARE_PROVIDER_SITE_OTHER)
Admission: RE | Admit: 2021-12-28 | Discharge: 2021-12-28 | Disposition: A | Discharge status: Home / Self Care | Payer: Medicaid Other | Source: Ambulatory Visit | Attending: Vascular Surgery | Admitting: Vascular Surgery

## 2021-12-28 ENCOUNTER — Ambulatory Visit: Payer: Medicaid Other | Admitting: Physician Assistant

## 2021-12-28 ENCOUNTER — Telehealth: Payer: Self-pay

## 2021-12-28 VITALS — BP 159/103 | HR 100 | Temp 98.2°F | Resp 20 | Ht 64.0 in | Wt 138.6 lb

## 2021-12-28 DIAGNOSIS — I739 Peripheral vascular disease, unspecified: Secondary | ICD-10-CM | POA: Diagnosis not present

## 2021-12-28 NOTE — Telephone Encounter (Signed)
Attempted to reach pt to schedule procedure. Left voicemail for pt to call us back.

## 2021-12-29 ENCOUNTER — Other Ambulatory Visit: Payer: Self-pay

## 2021-12-29 DIAGNOSIS — I739 Peripheral vascular disease, unspecified: Secondary | ICD-10-CM

## 2021-12-29 NOTE — Progress Notes (Signed)
Pre op letter mailed to pt.

## 2022-01-08 ENCOUNTER — Ambulatory Visit (HOSPITAL_BASED_OUTPATIENT_CLINIC_OR_DEPARTMENT_OTHER): Payer: Medicaid Other

## 2022-01-08 ENCOUNTER — Encounter (HOSPITAL_COMMUNITY): Payer: Self-pay | Admitting: Vascular Surgery

## 2022-01-08 ENCOUNTER — Encounter (HOSPITAL_COMMUNITY)
Admission: RE | Disposition: A | Discharge status: Home / Self Care | Payer: Self-pay | Source: Ambulatory Visit | Attending: Vascular Surgery

## 2022-01-08 ENCOUNTER — Other Ambulatory Visit: Payer: Self-pay

## 2022-01-08 ENCOUNTER — Ambulatory Visit (HOSPITAL_COMMUNITY)
Admission: RE | Admit: 2022-01-08 | Discharge: 2022-01-08 | Disposition: A | Discharge status: Home / Self Care | Payer: Medicaid Other | Source: Ambulatory Visit | Attending: Vascular Surgery | Admitting: Vascular Surgery

## 2022-01-08 DIAGNOSIS — F17210 Nicotine dependence, cigarettes, uncomplicated: Secondary | ICD-10-CM | POA: Diagnosis not present

## 2022-01-08 DIAGNOSIS — I10 Essential (primary) hypertension: Secondary | ICD-10-CM | POA: Diagnosis not present

## 2022-01-08 DIAGNOSIS — I70221 Atherosclerosis of native arteries of extremities with rest pain, right leg: Secondary | ICD-10-CM

## 2022-01-08 DIAGNOSIS — Z7982 Long term (current) use of aspirin: Secondary | ICD-10-CM | POA: Insufficient documentation

## 2022-01-08 DIAGNOSIS — I739 Peripheral vascular disease, unspecified: Secondary | ICD-10-CM

## 2022-01-08 DIAGNOSIS — Z01810 Encounter for preprocedural cardiovascular examination: Secondary | ICD-10-CM | POA: Diagnosis not present

## 2022-01-08 HISTORY — PX: ABDOMINAL AORTOGRAM W/LOWER EXTREMITY: CATH118223

## 2022-01-08 LAB — POCT I-STAT, CHEM 8
BUN: 23 mg/dL (ref 8–23)
Calcium, Ion: 1.16 mmol/L (ref 1.15–1.40)
Chloride: 103 mmol/L (ref 98–111)
Creatinine, Ser: 1.6 mg/dL — ABNORMAL HIGH (ref 0.44–1.00)
Glucose, Bld: 73 mg/dL (ref 70–99)
HCT: 38 % (ref 36.0–46.0)
Hemoglobin: 12.9 g/dL (ref 12.0–15.0)
Potassium: 3.9 mmol/L (ref 3.5–5.1)
Sodium: 139 mmol/L (ref 135–145)
TCO2: 26 mmol/L (ref 22–32)

## 2022-01-08 SURGERY — ABDOMINAL AORTOGRAM W/LOWER EXTREMITY
Anesthesia: LOCAL

## 2022-01-08 MED ORDER — IODIXANOL 320 MG/ML IV SOLN
INTRAVENOUS | Status: DC | PRN
  Administered 2022-01-08: 70 mL via INTRA_ARTERIAL

## 2022-01-08 MED ORDER — SODIUM CHLORIDE 0.9 % IV SOLN: 250.0000 mL | INTRAVENOUS | Status: DC | PRN

## 2022-01-08 MED ORDER — MIDAZOLAM HCL 2 MG/2ML IJ SOLN
INTRAMUSCULAR | Status: DC | PRN
  Administered 2022-01-08: 1 mg via INTRAVENOUS

## 2022-01-08 MED ORDER — HYDRALAZINE HCL 20 MG/ML IJ SOLN: 5.0000 mg | INTRAMUSCULAR | Status: DC | PRN

## 2022-01-08 MED ORDER — SODIUM CHLORIDE 0.9 % WEIGHT BASED INFUSION
1.0000 mL/kg/h | INTRAVENOUS | Status: DC
  Administered 2022-01-08: 1 mL/kg/h via INTRAVENOUS

## 2022-01-08 MED ORDER — LIDOCAINE HCL (PF) 1 % IJ SOLN
INTRAMUSCULAR | Status: DC | PRN
  Administered 2022-01-08: 15 mL

## 2022-01-08 MED ORDER — ONDANSETRON HCL 4 MG/2ML IJ SOLN: 4.0000 mg | Freq: Four times a day (QID) | INTRAMUSCULAR | Status: DC | PRN

## 2022-01-08 MED ORDER — FENTANYL CITRATE (PF) 100 MCG/2ML IJ SOLN
INTRAMUSCULAR | Status: AC
  Filled 2022-01-08: qty 2

## 2022-01-08 MED ORDER — SODIUM CHLORIDE 0.9% FLUSH: 3.0000 mL | INTRAVENOUS | Status: DC | PRN

## 2022-01-08 MED ORDER — LABETALOL HCL 5 MG/ML IV SOLN: 10.0000 mg | INTRAVENOUS | Status: DC | PRN

## 2022-01-08 MED ORDER — ACETAMINOPHEN 325 MG PO TABS: 650.0000 mg | ORAL_TABLET | ORAL | Status: DC | PRN

## 2022-01-08 MED ORDER — HEPARIN (PORCINE) IN NACL 1000-0.9 UT/500ML-% IV SOLN
INTRAVENOUS | Status: AC
  Filled 2022-01-08: qty 1000

## 2022-01-08 MED ORDER — SODIUM CHLORIDE 0.9% FLUSH: 3.0000 mL | Freq: Two times a day (BID) | INTRAVENOUS | Status: DC

## 2022-01-08 MED ORDER — FENTANYL CITRATE (PF) 100 MCG/2ML IJ SOLN
INTRAMUSCULAR | Status: DC | PRN
  Administered 2022-01-08: 50 ug via INTRAVENOUS

## 2022-01-08 MED ORDER — HEPARIN (PORCINE) IN NACL 1000-0.9 UT/500ML-% IV SOLN
INTRAVENOUS | Status: DC | PRN
  Administered 2022-01-08 (×2): 500 mL

## 2022-01-08 MED ORDER — SODIUM CHLORIDE 0.9 % IV SOLN: INTRAVENOUS | Status: DC

## 2022-01-08 MED ORDER — MIDAZOLAM HCL 2 MG/2ML IJ SOLN
INTRAMUSCULAR | Status: AC
  Filled 2022-01-08: qty 2

## 2022-01-08 MED ORDER — LIDOCAINE HCL (PF) 1 % IJ SOLN
INTRAMUSCULAR | Status: AC
  Filled 2022-01-08: qty 30

## 2022-01-08 SURGICAL SUPPLY — 10 items
CATH OMNI FLUSH 5F 65CM (CATHETERS) IMPLANT
GLIDEWIRE ADV .035X260CM (WIRE) IMPLANT
KIT MICROPUNCTURE NIT STIFF (SHEATH) IMPLANT
KIT PV (KITS) ×1 IMPLANT
SHEATH PINNACLE 5F 10CM (SHEATH) IMPLANT
SHEATH PROBE COVER 6X72 (BAG) IMPLANT
SYR MEDRAD MARK 7 150ML (SYRINGE) ×1 IMPLANT
TRANSDUCER W/STOPCOCK (MISCELLANEOUS) ×1 IMPLANT
TRAY PV CATH (CUSTOM PROCEDURE TRAY) ×1 IMPLANT
WIRE BENTSON .035X145CM (WIRE) IMPLANT

## 2022-01-08 NOTE — Progress Notes (Signed)
Lower extremity vein mapping has been completed.   Preliminary results in CV Proc.   Brooke Chapman 01/08/2022 9:31 AM

## 2022-01-08 NOTE — Progress Notes (Signed)
Pt PIV removed, post ambulation walk done. No bleeding or hematoma noted. Pt tolerated well. Pt left in stable condition.

## 2022-01-08 NOTE — Progress Notes (Signed)
Received report from Rosalia, RN

## 2022-01-08 NOTE — H&P (Addendum)
HISTORY AND PHYSICAL   Patient seen and examined in preop holding.  No complaints. No changes to medication history or physical exam since last seen in clinic. After discussing the risks and benefits of right leg angiogram for critical limb ischemia with rest pain in the right foot, Brooke Chapman elected to proceed.   Imaging reviewed and case discussed with Dr. Donzetta Matters who is in an emergency and asked that I move forward with angiogram as long as Brooke Chapman is amenable. Known right SFA occlusion. Will need future bypass. Possible inflow stenting today. Will access the right groin for inflow intervention and RLE dx runoff  Brooke John MD   CC:  follow up. Requesting Provider:  No ref. provider found  HPI: This is a 60 y.o. female who is here today for follow up for PAD.  Pt has hx of angiogram with stent of the left CIA by Dr. Donzetta Matters on 07/13/2019.    Pt was last seen 08/26/2020 and at that time, she did have some calluses that were causing more pain than claudication.  She was not walking much throughout the day. She did not have any stenosis or occlusion of the left CIA stent.  The pt returns today for follow up.  She states that she has calluses on her feet that are painful and interfering with her daily lifestyle.  She had seen Dr. Jacqualyn Posey with podiatry and he would not operate on her calluses due to her ABI.  She states the right foot is worse and having pain in her left hip from compensating due to the pain.  She denies any claudication.  She does not really describe classic rest pain.  She continues to smoke but has been working on quitting.    The pt is on a statin for cholesterol management.    The pt is on an aspirin.    Other AC:  none The pt is on CCB, BB, ARB, HCTZ for hypertension.  The pt does not have diabetes. Tobacco hx:  current  Pt does not have family hx of AAA.  Past Medical History:  Diagnosis Date   Anemia    Antiplatelet or antithrombotic long-term use 01/13/2020    Black stools 01/13/2020   Essential hypertension    Frontal balding 12/28/2019   GAD (generalized anxiety disorder) 05/01/2020   HTN (hypertension) 05/01/2018   Hyperlipidemia    Hypertension    Iron deficiency anemia 12/28/2019   PAD (peripheral artery disease) (HCC)    Porokeratosis 08/26/2018   PVD (peripheral vascular disease) ([place]) 08/26/2018   Skin lesion 05/26/2021   Tobacco dependence 05/01/2018   Unintended weight loss 01/13/2020    Past Surgical History:  Procedure Laterality Date   ABDOMINAL AORTOGRAM W/LOWER EXTREMITY Bilateral 07/13/2019   Procedure: ABDOMINAL AORTOGRAM W/LOWER EXTREMITY;  Surgeon: Waynetta Sandy, MD;  Location: [facility];  Service: Cardiovascular;  Laterality: Bilateral;   BREAST EXCISIONAL BIOPSY Left    LESS THAN 5 YEARS AGO   PERIPHERAL VASCULAR INTERVENTION Left 07/13/2019   Procedure: PERIPHERAL VASCULAR INTERVENTION;  Surgeon: Waynetta Sandy, MD;  Location: [facility];  Service: Cardiovascular;  Laterality: Left;  common iliac    Allergies  Allergen Reactions   Latex Rash    Current Facility-Administered Medications  Medication Dose Route Frequency Provider Last Rate Last Admin   0.9 %  sodium chloride infusion   Intravenous Continuous Waynetta Sandy, MD 100 mL/hr at 01/08/22 0606 New Bag at 01/08/22 [phone]  Family History  Problem Relation Age of Onset   Heart disease Mother    Hypertension Mother    Hyperlipidemia Mother    Hypertension Brother    Heart disease Maternal Aunt    Hypertension Maternal Aunt    Hyperlipidemia Maternal Aunt    Colon cancer Neg Hx    Liver cancer Neg Hx    Rectal cancer Neg Hx    Stomach cancer Neg Hx     Social History   Socioeconomic History   Marital status: Single    Spouse name: Not on file   Number of children: 0   Years of education: Not on file   Highest education level: Not on file  Occupational History   Occupation: Prep cook  Tobacco Use    Smoking status: Every Day    Packs/day: 0.50    Types: Cigarettes   Smokeless tobacco: Never   Tobacco comments:    6-10 cigs daily  Vaping Use   Vaping Use: Never used  Substance and Sexual Activity   Alcohol use: Yes    Comment: 1 per day   Drug use: Never   Sexual activity: Not Currently  Other Topics Concern   Not on file  Social History Narrative   Not on file   Social Determinants of Health   Financial Resource Strain: Low Risk  (06/27/2021)   Overall Financial Resource Strain (CARDIA)    Difficulty of Paying Living Expenses: Not hard at all  Food Insecurity: Not on file  Transportation Needs: Not on file  Physical Activity: Not on file  Stress: Not on file  Social Connections: Not on file  Intimate Partner Violence: Not on file     REVIEW OF SYSTEMS:   '[X]'$  denotes positive finding, '[ ]'$  denotes negative finding Cardiac  Comments:  Chest pain or chest pressure:    Shortness of breath upon exertion:    Short of breath when lying flat:    Irregular heart rhythm:        Vascular    Pain in calf, thigh, or hip brought on by ambulation:    Pain in feet at night that wakes you up from your sleep:     Blood clot in your veins:    Leg swelling:         Pulmonary    Oxygen at home:    Productive cough:     Wheezing:         Neurologic    Sudden weakness in arms or legs:     Sudden numbness in arms or legs:     Sudden onset of difficulty speaking or slurred speech:    Temporary loss of vision in one eye:     Problems with dizziness:         Gastrointestinal    Blood in stool:     Vomited blood:         Genitourinary    Burning when urinating:     Blood in urine:        Psychiatric    Major depression:         Hematologic    Bleeding problems:    Problems with blood clotting too easily:        Skin    Rashes or ulcers: x See HPI      Constitutional    Fever or chills:      PHYSICAL EXAMINATION:  Today's Vitals   01/08/22 0536 01/08/22  0550  BP: (!) 163/98  Pulse: 61   Resp: 18   Temp: (!) 97.3 F (36.3 C)   TempSrc: Temporal   SpO2: 98%   Weight: 63.5 kg   Height: '5\' 4"'$  (1.626 m)   PainSc:  0-No pain   Body mass index is 24.03 kg/m.;   General:  WDWN in NAD; vital signs documented above Gait: Not observed HENT: WNL, normocephalic Pulmonary: normal non-labored breathing , without wheezing Cardiac: regular HR, without carotid bruits Abdomen: soft, NT; aortic pulse is not palpable Skin: without rashes Vascular Exam/Pulses:  Right Left  Radial 2+ (normal) 2+ (normal)  Femoral 2+ (normal) 2+ (normal)  Popliteal Unable to palpate Unable to palpate  DP Faint monophasic doppler signal Monophasic doppler  PT Faint monophasic doppler Monophasic doppler  Peroneal Faint monophasic doppler Unable to obtain   Extremities: without ischemic changes, without Gangrene , without cellulitis; without open wounds; calluses present bilateral feet Right foot:   Left foot:  Musculoskeletal: no muscle wasting or atrophy  Neurologic: A&O X 3 Psychiatric:  The pt has Normal affect.   Non-Invasive Vascular Imaging:   ABI's/TBI's on 12/28/2021: Right:  0.63/0.34 - Great toe pressure: 50 Left:  0.45/0.35 - Great toe pressure: 52  Arterial duplex on 12/28/2021: Abdominal Aorta Findings:  +-----------+-------+----------+----------+---------+--------+--------+  Location  AP (cm)Trans (cm)PSV (cm/s)Waveform ThrombusComments  +-----------+-------+----------+----------+---------+--------+--------+  Proximal  2.37   2.48      48        triphasic                  +-----------+-------+----------+----------+---------+--------+--------+  Mid       1.62   1.89      33        triphasic                  +-----------+-------+----------+----------+---------+--------+--------+  Distal    1.51   1.51      51        triphasic                   +-----------+-------+----------+----------+---------+--------+--------+  RT CIA Prox0.8    0.7       106       triphasic                  +-----------+-------+----------+----------+---------+--------+--------+   Left Stent(s):  +---------------+--------+--------------+----------+---------+  Left CIA       PSV cm/sStenosis      Waveform  Comments   +---------------+--------+--------------+----------+---------+  Prox to Stent  51                    triphasic Distal Ao  +---------------+--------+--------------+----------+---------+  Proximal Stent 61                    triphasic            +---------------+--------+--------------+----------+---------+  Mid Stent      112     1-49% stenosistriphasic            +---------------+--------+--------------+----------+---------+  Distal Stent   63                    monophasic           +---------------+--------+--------------+----------+---------+  Distal to Stent90                    monophasic           +---------------+--------+--------------+----------+---------+   Previous ABI's/TBI's on 08/26/2020: Right:  0.58/0.39 - Great toe pressure: 64 Left:  0.60/0.45 - Great toe pressure:  74  Previous arterial duplex on 08/26/2020: Summary:  Abdominal Aorta: Aorta/ Iliac: no evidence of stenosis or occlusion.  Patent Left CIA stent.     ASSESSMENT/PLAN:: 61 y.o. female here for follow up for PAD with hx of angiogram with stent of the left CIA by Dr. Donzetta Matters on 07/13/2019.  PAD -pt with moderately decreased ABI.  Her left CIA stent is patent with minimal stenosis.   She does have painful calluses that the podiatrist will not work on due to her PAD. After discussion with pt and Dr. Donzetta Matters, will plan for angiogram with emphasis on the right leg first with possible intervention.  -continue asa/statin  Current smoker -discussed importance of smoking cessation and with her PAD, she is at increased risk for  heart disease, stroke, limb loss.  She does also have family hx of heart disease.  She will work on smoking cessation.     Leontine Locket, [facility] [phone]  Clinic MD:   Donzetta Matters

## 2022-01-08 NOTE — Progress Notes (Signed)
HISTORY AND PHYSICAL   Patient seen and examined in preop holding.  No complaints. No changes to medication history or physical exam since last seen in clinic. After discussing the risks and benefits of right leg angiogram for critical limb ischemia with rest pain in the right foot, Brooke Chapman elected to proceed.   Brooke John MD   CC:  follow up. Requesting Provider:  No ref. provider found  HPI: This is a 61 y.o. female who is here today for follow up for PAD.  Pt has hx of angiogram with stent of the left CIA by Dr. Donzetta Matters on 07/13/2019.    Pt was last seen 08/26/2020 and at that time, she did have some calluses that were causing more pain than claudication.  She was not walking much throughout the day. She did not have any stenosis or occlusion of the left CIA stent.  The pt returns today for follow up.  She states that she has calluses on her feet that are painful and interfering with her daily lifestyle.  She had seen Dr. Jacqualyn Posey with podiatry and he would not operate on her calluses due to her ABI.  She states the right foot is worse and having pain in her left hip from compensating due to the pain.  She denies any claudication.  She does not really describe classic rest pain.  She continues to smoke but has been working on quitting.    The pt is on a statin for cholesterol management.    The pt is on an aspirin.    Other AC:  none The pt is on CCB, BB, ARB, HCTZ for hypertension.  The pt does not have diabetes. Tobacco hx:  current  Pt does not have family hx of AAA.  Past Medical History:  Diagnosis Date   Anemia    Antiplatelet or antithrombotic long-term use 01/13/2020   Black stools 01/13/2020   Essential hypertension    Frontal balding 12/28/2019   GAD (generalized anxiety disorder) 05/01/2020   HTN (hypertension) 05/01/2018   Hyperlipidemia    Hypertension    Iron deficiency anemia 12/28/2019   PAD (peripheral artery disease) (HCC)    Porokeratosis 08/26/2018    PVD (peripheral vascular disease) ([place]) 08/26/2018   Skin lesion 05/26/2021   Tobacco dependence 05/01/2018   Unintended weight loss 01/13/2020    Past Surgical History:  Procedure Laterality Date   ABDOMINAL AORTOGRAM W/LOWER EXTREMITY Bilateral 07/13/2019   Procedure: ABDOMINAL AORTOGRAM W/LOWER EXTREMITY;  Surgeon: Waynetta Sandy, MD;  Location: [place];  Service: Cardiovascular;  Laterality: Bilateral;   BREAST EXCISIONAL BIOPSY Left    LESS THAN 5 YEARS AGO   PERIPHERAL VASCULAR INTERVENTION Left 07/13/2019   Procedure: PERIPHERAL VASCULAR INTERVENTION;  Surgeon: Waynetta Sandy, MD;  Location: [place] [facility];  Service: Cardiovascular;  Laterality: Left;  common iliac    Allergies  Allergen Reactions   Latex Rash    Current Facility-Administered Medications  Medication Dose Route Frequency Provider Last Rate Last Admin   0.9 %  sodium chloride infusion   Intravenous Continuous Waynetta Sandy, MD 100 mL/hr at 01/08/22 0606 New Bag at 01/08/22 0606    Family History  Problem Relation Age of Onset   Heart disease Mother    Hypertension Mother    Hyperlipidemia Mother    Hypertension Brother    Heart disease Maternal Aunt    Hypertension Maternal Aunt    Hyperlipidemia Maternal Aunt    Colon  cancer Neg Hx    Liver cancer Neg Hx    Rectal cancer Neg Hx    Stomach cancer Neg Hx     Social History   Socioeconomic History   Marital status: Single    Spouse name: Not on file   Number of children: 0   Years of education: Not on file   Highest education level: Not on file  Occupational History   Occupation: Prep cook  Tobacco Use   Smoking status: Every Day    Packs/day: 0.50    Types: Cigarettes   Smokeless tobacco: Never   Tobacco comments:    6-10 cigs daily  Vaping Use   Vaping Use: Never used  Substance and Sexual Activity   Alcohol use: Yes    Comment: 1 per day   Drug use: Never   Sexual activity: Not Currently   Other Topics Concern   Not on file  Social History Narrative   Not on file   Social Determinants of Health   Financial Resource Strain: Low Risk  (06/27/2021)   Overall Financial Resource Strain (CARDIA)    Difficulty of Paying Living Expenses: Not hard at all  Food Insecurity: Not on file  Transportation Needs: Not on file  Physical Activity: Not on file  Stress: Not on file  Social Connections: Not on file  Intimate Partner Violence: Not on file     REVIEW OF SYSTEMS:   '[X]'$  denotes positive finding, '[ ]'$  denotes negative finding Cardiac  Comments:  Chest pain or chest pressure:    Shortness of breath upon exertion:    Short of breath when lying flat:    Irregular heart rhythm:        Vascular    Pain in calf, thigh, or hip brought on by ambulation:    Pain in feet at night that wakes you up from your sleep:     Blood clot in your veins:    Leg swelling:         Pulmonary    Oxygen at home:    Productive cough:     Wheezing:         Neurologic    Sudden weakness in arms or legs:     Sudden numbness in arms or legs:     Sudden onset of difficulty speaking or slurred speech:    Temporary loss of vision in one eye:     Problems with dizziness:         Gastrointestinal    Blood in stool:     Vomited blood:         Genitourinary    Burning when urinating:     Blood in urine:        Psychiatric    Major depression:         Hematologic    Bleeding problems:    Problems with blood clotting too easily:        Skin    Rashes or ulcers: x See HPI      Constitutional    Fever or chills:      PHYSICAL EXAMINATION:  Today's Vitals   01/08/22 0536 01/08/22 0550  BP: (!) 163/98   Pulse: 61   Resp: 18   Temp: (!) 97.3 F (36.3 C)   TempSrc: Temporal   SpO2: 98%   Weight: 63.5 kg   Height: '5\' 4"'$  (1.626 m)   PainSc:  0-No pain   Body mass index is 24.03 kg/m.;   General:  WDWN in NAD; vital signs documented above Gait: Not observed HENT: WNL,  normocephalic Pulmonary: normal non-labored breathing , without wheezing Cardiac: regular HR, without carotid bruits Abdomen: soft, NT; aortic pulse is not palpable Skin: without rashes Vascular Exam/Pulses:  Right Left  Radial 2+ (normal) 2+ (normal)  Femoral 2+ (normal) 2+ (normal)  Popliteal Unable to palpate Unable to palpate  DP Faint monophasic doppler signal Monophasic doppler  PT Faint monophasic doppler Monophasic doppler  Peroneal Faint monophasic doppler Unable to obtain   Extremities: without ischemic changes, without Gangrene , without cellulitis; without open wounds; calluses present bilateral feet Right foot:   Left foot:  Musculoskeletal: no muscle wasting or atrophy  Neurologic: A&O X 3 Psychiatric:  The pt has Normal affect.   Non-Invasive Vascular Imaging:   ABI's/TBI's on 12/28/2021: Right:  0.63/0.34 - Great toe pressure: 50 Left:  0.45/0.35 - Great toe pressure: 52  Arterial duplex on 12/28/2021: Abdominal Aorta Findings:  +-----------+-------+----------+----------+---------+--------+--------+  Location  AP (cm)Trans (cm)PSV (cm/s)Waveform ThrombusComments  +-----------+-------+----------+----------+---------+--------+--------+  Proximal  2.37   2.48      48        triphasic                  +-----------+-------+----------+----------+---------+--------+--------+  Mid       1.62   1.89      33        triphasic                  +-----------+-------+----------+----------+---------+--------+--------+  Distal    1.51   1.51      51        triphasic                  +-----------+-------+----------+----------+---------+--------+--------+  RT CIA Prox0.8    0.7       106       triphasic                  +-----------+-------+----------+----------+---------+--------+--------+   Left Stent(s):  +---------------+--------+--------------+----------+---------+  Left CIA       PSV cm/sStenosis      Waveform  Comments    +---------------+--------+--------------+----------+---------+  Prox to Stent  51                    triphasic Distal Ao  +---------------+--------+--------------+----------+---------+  Proximal Stent 61                    triphasic            +---------------+--------+--------------+----------+---------+  Mid Stent      112     1-49% stenosistriphasic            +---------------+--------+--------------+----------+---------+  Distal Stent   63                    monophasic           +---------------+--------+--------------+----------+---------+  Distal to Stent90                    monophasic           +---------------+--------+--------------+----------+---------+   Previous ABI's/TBI's on 08/26/2020: Right:  0.58/0.39 - Great toe pressure: 64 Left:  0.60/0.45 - Great toe pressure:  74  Previous arterial duplex on 08/26/2020: Summary:  Abdominal Aorta: Aorta/ Iliac: no evidence of stenosis or occlusion.  Patent Left CIA stent.     ASSESSMENT/PLAN:: 61 y.o. female here for follow up for PAD with hx of angiogram with stent of  the left CIA by Dr. Donzetta Matters on 07/13/2019.  PAD -pt with moderately decreased ABI.  Her left CIA stent is patent with minimal stenosis.   She does have painful calluses that the podiatrist will not work on due to her PAD. After discussion with pt and Dr. Donzetta Matters, will plan for angiogram with emphasis on the right leg first with possible intervention.  -continue asa/statin  Current smoker -discussed importance of smoking cessation and with her PAD, she is at increased risk for heart disease, stroke, limb loss.  She does also have family hx of heart disease.  She will work on smoking cessation.     Leontine Locket, [facility] [phone]  Clinic MD:   Donzetta Matters

## 2022-01-08 NOTE — Op Note (Signed)
    Patient name: Brooke Chapman MRN: [id number] DOB: 1960/03/31 Sex: female  01/08/2022 Pre-operative Diagnosis: Right lower extremity critical ischemia with rest pain in the right foot Post-operative diagnosis:  Same Surgeon:  Broadus John, MD Procedure Performed: 1.  Ultrasound-guided micropuncture access of the right common femoral artery 2.  Aortogram 3.  Right lower extremity angiogram 4.  Manual pressure held for management of arteriotomy 5.  Moderate sedation time 23 minutes 6.  Contrast volume 70 mL  Indications: Patient is a 61 year old well-known to our practice with severe peripheral arterial disease.  History includes left-sided common iliac artery stenting.  She recently presented to the office with new onset right lower extremity rest pain.  After discussing the risk and benefits of right lower extremity angiography in an effort to define and improve perfusion to alleviate rest pain, Brooke Chapman elected to proceed.  Findings:  Aortogram: Severe atherosclerotic disease throughout the aortoiliac segments bilaterally.  Left common iliac artery stent widely patent.  Small vasculature throughout with dense calcific disease.  No other areas of focal flow-limiting stenosis.  On the right: Severely diseased common femoral artery, profunda.  The superficial femoral artery is atretic, occluding after roughly 10 cm.  There are diffuse profunda collaterals throughout the thigh feeling geniculate branches.  The popliteal artery does not opacify.  Distally, there is reconstitution of the anterior tibial, peroneal, posterior tibial arteries through profunda collaterals.  Dominant runoff is the anterior tibial artery.  This continues into the foot via the dorsalis pedis.  The posterior tibial artery and peroneal arteries arise from the distal tibioperoneal trunk.  These are patent but fill later, and opacify to the level of the ankle with collaterals continuing into the foot.  The medial and lateral  plantar branches do not fill.   Procedure:  The patient was identified in the holding area and taken to room 8.  The patient was then placed supine on the table and prepped and draped in the usual sterile fashion.  A time out was called.    From prior imaging, as well as 2021 angiogram, I was most concerned about inflow disease.  Therefore I elected to percutaneously access the right common femoral artery.  Ultrasound was used to evaluate the right common femoral artery.  It was patent but demonstrated severe eccentric disease.  A digital ultrasound image was acquired.  A micropuncture needle was used to access the right common femoral artery under ultrasound guidance.  An 018 wire was advanced without resistance and a micropuncture sheath was placed.  The 018 wire was removed and a benson wire was placed.  The micropuncture sheath was exchanged for a 5 french sheath.  An omniflush catheter was advanced over the wire to the level of L-1.  An abdominal angiogram was obtained.  Multiple obliquities followed in an effort to ensure there were no flow-limiting stenoses appreciated in the right iliac system.  Right lower extremity angiogram followed extremity angiogram followed through the right-sided sheath.  Impression: Severe, multilevel occlusive disease with small, diseased inflow arteries. Patient would benefit from right-sided common femoral endarterectomy, femoral to anterior tibial artery bypass.  I will discuss this with Dr. Donzetta Matters, her primary vascular surgeon work to schedule vein mapping post-procedure.   Cassandria Santee, MD [facility] [place] Office: [phone]

## 2022-01-08 NOTE — Progress Notes (Addendum)
SITE AREA: right groin/femoral  SITE PRIOR TO REMOVAL:  LEVEL 0  PRESSURE APPLIED FOR: approximately 20 minutes  MANUAL: yes  PATIENT STATUS DURING PULL: stable, reminded to take deep breath and relax body  POST PULL SITE:  LEVEL 0  POST PULL INSTRUCTIONS GIVEN: yes  POST PULL PULSES PRESENT: bilateral pedal and post tibial pulses dopplerable  DRESSING APPLIED: gauze with tegaderm   BEDREST BEGINS @ 0852  COMMENTS: right sided pulses stronger than left

## 2022-01-09 ENCOUNTER — Telehealth: Payer: Self-pay

## 2022-01-09 NOTE — Telephone Encounter (Signed)
Pt called requesting a date to return to work.  Reviewed pt's chart, returned call for clarification, two identifiers used. Informed her she should be out of work for 2 days, so she can return to work on Thursday, 12/14. Confirmed understanding.

## 2022-01-11 ENCOUNTER — Ambulatory Visit: Payer: Medicaid Other | Admitting: Urology

## 2022-01-19 ENCOUNTER — Other Ambulatory Visit: Payer: Self-pay

## 2022-01-19 DIAGNOSIS — I739 Peripheral vascular disease, unspecified: Secondary | ICD-10-CM

## 2022-01-25 ENCOUNTER — Encounter: Payer: Self-pay | Admitting: Urology

## 2022-01-25 ENCOUNTER — Ambulatory Visit: Payer: Medicaid Other | Admitting: Urology

## 2022-01-25 VITALS — BP 169/102 | HR 80 | Ht 64.0 in | Wt 140.0 lb

## 2022-01-25 DIAGNOSIS — R8290 Unspecified abnormal findings in urine: Secondary | ICD-10-CM | POA: Diagnosis not present

## 2022-01-25 DIAGNOSIS — N261 Atrophy of kidney (terminal): Secondary | ICD-10-CM

## 2022-01-25 DIAGNOSIS — N2889 Other specified disorders of kidney and ureter: Secondary | ICD-10-CM | POA: Diagnosis not present

## 2022-01-25 LAB — URINALYSIS
Bilirubin, UA: NEGATIVE
Glucose, UA: NEGATIVE mg/dL
Ketones, UA: NEGATIVE
Leukocytes, UA: NEGATIVE
Nitrite, UA: NEGATIVE
Protein, UA: NEGATIVE
Spec Grav, UA: 1.01 (ref 1.010–1.025)
Urobilinogen, UA: 0.2 E.U./dL
pH, UA: 6.5 (ref 5.0–8.0)

## 2022-01-25 NOTE — Progress Notes (Signed)
Assessment: 1. Renal mass; left - non-enhancing, stable in size x 18 months   2. Atrophic kidney, right   3. Abnormal urine findings     Plan: Microscopic U/A today I reviewed the patient's chart including provider notes, lab results, and imaging results. I personally reviewed the imaging studies including the CT scan of the chest from 9/23 and the MRI of the abdomen from 10/23. Results were discussed with the patient in detail today.  Given the stability of this small lesion over 18 months and the nonenhancing appearance, this is likely benign.   Recommend surveillance at this time with follow-up imaging in approximately 6 months (April 2024). Return to office in 4 months to arrange for repeat imaging  Chief Complaint:  Chief Complaint  Patient presents with   kidney lesion    History of Present Illness:  Brooke Chapman is a 61 y.o. female who is seen in consultation from Ladell Pier, MD for evaluation of possible left renal mass. She was recently evaluated with a CT chest without contrast on 09/30/2021.  This showed a exophytic isodense 1.7 cm upper left renal cortical lesion which was stable in comparison to a study from 5/22, a simple 1.4 cm posterior left renal cyst, and asymmetric right renal atrophy.  She was evaluated further with a MRI of the abdomen with and without contrast on 11/04/2021.  This showed a round nonenhancing 2.0 x 1.3 cm mass closely opposed to the spleen, superior pole of left kidney and left adrenal gland, marked atrophy of the right kidney, simple benign bilateral renal cortical cyst. She is not having any flank or abdominal pain.  No dysuria or gross hematuria.  No history of UTIs or kidney stones.  She does have some urgency and occasional incontinence.   Past Medical History:  Past Medical History:  Diagnosis Date   Anemia    Antiplatelet or antithrombotic long-term use 01/13/2020   Black stools 01/13/2020   Essential hypertension    Frontal  balding 12/28/2019   GAD (generalized anxiety disorder) 05/01/2020   HTN (hypertension) 05/01/2018   Hyperlipidemia    Hypertension    Iron deficiency anemia 12/28/2019   PAD (peripheral artery disease) (HCC)    Porokeratosis 08/26/2018   PVD (peripheral vascular disease) ([place]) 08/26/2018   Skin lesion 05/26/2021   Tobacco dependence 05/01/2018   Unintended weight loss 01/13/2020    Past Surgical History:  Past Surgical History:  Procedure Laterality Date   ABDOMINAL AORTOGRAM W/LOWER EXTREMITY Bilateral 07/13/2019   Procedure: ABDOMINAL AORTOGRAM W/LOWER EXTREMITY;  Surgeon: Waynetta Sandy, MD;  Location: [facility];  Service: Cardiovascular;  Laterality: Bilateral;   ABDOMINAL AORTOGRAM W/LOWER EXTREMITY N/A 01/08/2022   Procedure: ABDOMINAL AORTOGRAM W/LOWER EXTREMITY;  Surgeon: Broadus John, MD;  Location: [facility];  Service: Vascular;  Laterality: N/A;   BREAST EXCISIONAL BIOPSY Left    LESS THAN 5 YEARS AGO   PERIPHERAL VASCULAR INTERVENTION Left 07/13/2019   Procedure: PERIPHERAL VASCULAR INTERVENTION;  Surgeon: Waynetta Sandy, MD;  Location: [facility];  Service: Cardiovascular;  Laterality: Left;  common iliac    Allergies:  Allergies  Allergen Reactions   Latex Rash    Family History:  Family History  Problem Relation Age of Onset   Heart disease Mother    Hypertension Mother    Hyperlipidemia Mother    Hypertension Brother    Heart disease Maternal Aunt    Hypertension Maternal Aunt    Hyperlipidemia  Maternal Aunt    Colon cancer Neg Hx    Liver cancer Neg Hx    Rectal cancer Neg Hx    Stomach cancer Neg Hx     Social History:  Social History   Tobacco Use   Smoking status: Every Day    Packs/day: 0.50    Types: Cigarettes   Smokeless tobacco: Never   Tobacco comments:    6-10 cigs daily  Vaping Use   Vaping Use: Never used  Substance Use Topics   Alcohol use: Yes    Comment: 1 per day   Drug use:  Never    Review of symptoms:  Constitutional:  Negative for unexplained weight loss, night sweats, fever, chills ENT:  Negative for nose bleeds, sinus pain, painful swallowing CV:  Negative for chest pain, shortness of breath, exercise intolerance, palpitations, loss of consciousness Resp:  Negative for cough, wheezing, shortness of breath GI:  Negative for nausea, vomiting, diarrhea, bloody stools GU:  Positives noted in HPI; otherwise negative for gross hematuria, dysuria Neuro:  Negative for seizures, poor balance, limb weakness, slurred speech Psych:  Negative for lack of energy, depression, anxiety Endocrine:  Negative for polydipsia, polyuria, symptoms of hypoglycemia (dizziness, hunger, sweating) Hematologic:  Negative for anemia, purpura, petechia, prolonged or excessive bleeding, use of anticoagulants  Allergic:  Negative for difficulty breathing or choking as a result of exposure to anything; no shellfish allergy; no allergic response (rash/itch) to materials, foods  Physical exam: BP (!) 169/102   Pulse 80   Ht '5\' 4"'$  (1.626 m)   Wt 140 lb (63.5 kg)   BMI 24.03 kg/m  GENERAL APPEARANCE:  Well appearing, well developed, well nourished, NAD HEENT: Atraumatic, Normocephalic, oropharynx clear. NECK: Supple without lymphadenopathy or thyromegaly. LUNGS: Clear to auscultation bilaterally. HEART: Regular Rate and Rhythm without murmurs, gallops, or rubs. ABDOMEN: Soft, non-tender, No Masses. EXTREMITIES: Moves all extremities well.  Without clubbing, cyanosis, or edema. NEUROLOGIC:  Alert and oriented x 3, normal gait, CN II-XII grossly intact.  MENTAL STATUS:  Appropriate. BACK:  Non-tender to palpation.  No CVAT SKIN:  Warm, dry and intact.    Results: U/A dipstick:  trace blood

## 2022-01-26 ENCOUNTER — Encounter: Payer: Self-pay | Admitting: Urology

## 2022-01-26 LAB — URINALYSIS, MICROSCOPIC ONLY
Bacteria, UA: NONE SEEN
Casts: NONE SEEN /lpf
Epithelial Cells (non renal): NONE SEEN /hpf (ref 0–10)
RBC, Urine: NONE SEEN /hpf (ref 0–2)
WBC, UA: NONE SEEN /hpf (ref 0–5)

## 2022-02-08 ENCOUNTER — Ambulatory Visit: Payer: Medicaid Other | Attending: Internal Medicine | Admitting: Internal Medicine

## 2022-02-08 ENCOUNTER — Encounter: Payer: Self-pay | Admitting: Internal Medicine

## 2022-02-08 VITALS — BP 114/76 | HR 74 | Temp 98.1°F | Ht 64.0 in | Wt 138.0 lb

## 2022-02-08 DIAGNOSIS — D509 Iron deficiency anemia, unspecified: Secondary | ICD-10-CM

## 2022-02-08 DIAGNOSIS — E782 Mixed hyperlipidemia: Secondary | ICD-10-CM | POA: Diagnosis not present

## 2022-02-08 DIAGNOSIS — I739 Peripheral vascular disease, unspecified: Secondary | ICD-10-CM | POA: Diagnosis not present

## 2022-02-08 DIAGNOSIS — I10 Essential (primary) hypertension: Secondary | ICD-10-CM

## 2022-02-08 DIAGNOSIS — N1832 Chronic kidney disease, stage 3b: Secondary | ICD-10-CM | POA: Diagnosis not present

## 2022-02-08 DIAGNOSIS — M329 Systemic lupus erythematosus, unspecified: Secondary | ICD-10-CM | POA: Insufficient documentation

## 2022-02-08 DIAGNOSIS — R222 Localized swelling, mass and lump, trunk: Secondary | ICD-10-CM

## 2022-02-08 DIAGNOSIS — E876 Hypokalemia: Secondary | ICD-10-CM | POA: Diagnosis not present

## 2022-02-08 DIAGNOSIS — N289 Disorder of kidney and ureter, unspecified: Secondary | ICD-10-CM

## 2022-02-08 MED ORDER — AMLODIPINE BESYLATE 10 MG PO TABS: 10.0000 mg | ORAL_TABLET | Freq: Every day | ORAL | 3 refills | Status: DC

## 2022-02-08 MED ORDER — CARVEDILOL 25 MG PO TABS: 25.0000 mg | ORAL_TABLET | Freq: Two times a day (BID) | ORAL | 3 refills | Status: DC

## 2022-02-08 MED ORDER — POTASSIUM CHLORIDE ER 8 MEQ PO TBCR: 16.0000 meq | EXTENDED_RELEASE_TABLET | Freq: Every day | ORAL | 1 refills | Status: DC

## 2022-02-08 MED ORDER — ATORVASTATIN CALCIUM 20 MG PO TABS: 30.0000 mg | ORAL_TABLET | Freq: Every day | ORAL | 3 refills | Status: DC

## 2022-02-08 MED ORDER — CILOSTAZOL 100 MG PO TABS: 100.0000 mg | ORAL_TABLET | Freq: Two times a day (BID) | ORAL | 3 refills | Status: DC

## 2022-02-08 NOTE — Progress Notes (Signed)
Patient ID: Brooke Chapman, female    DOB: 1960/08/28  MRN: [id number]  CC: Hypertension (HTN f/u. Med refill./No questions / concerns. )   Subjective: Brooke Chapman is a 62 y.o. 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 Roxan Hockey, questionably benign schwannoma.  Patient not wanting biopsy.  Plan to monitor with repeat imaging)   HTN: She has seen clinical pharmacist since last visit with me.  Has medicines with her today.  Compliant with medications that include carvedilol 25 mg twice a day, Diovan/HCTZ 320/25 mg daily and amlodipine 10 mg daily.  Reports blood pressure at home has been good.  We have been keeping an eye on her kidney function.  GFR mainly 54-42.  One time high of 64 in 03/2021.  She is not on any NSAIDs.  Also has history of iron deficiency anemia.  She is on iron supplement twice a day.  Last hemoglobin about a year ago was 12.4.  Tob dep: Smokes 5 to 10 cigarettes a day.  Not ready to quit but states she is trying to cut down.  PAD: Had critical limb ischemia symptoms in the right foot.  Underwent angiogram 01/08/2022 by Dr. Virl Cagey.  Based on findings, plan is for bypass surgery scheduled for the 02/21/2022.   -Reports no issues with anesthesia in the past.  No CP or SOB at rest or on exertion.  Able to walk only half to 1 city block due to pain on the sole of the right foot.  She has 2 painful calluses on this foot.  Had seen podiatrist in November and declined having these callus shaved down because of vascular issues she was having with the foot.  Left renal mass:  Saw urologist Dr. Felipa Eth 01/26/2022.  His assessment was that this has been stable in size for the past 18 months.  Given stability in size he thinks it is  likely benign.  Recommends surveillance in 6 months which will put her around April of this year.  Mediastinal/paraspinal mass: Saw Dr. Roxan Hockey 12/12/2021 in follow-up for this.  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  Solitary fibrous tumor of the pleura is also in the differential.  Resection is an option versus continued surveillance.  Patient preferred the latter.  Recommends follow-up in 1 year with repeat CT of the chest.  Requesting refills on several medications including potassium  HM: She declines shingles vaccine, COVID booster and RSV vaccine. Patient Active Problem List   Diagnosis Date Noted   Renal mass; left - non-enhancing, stable in size x 18 months 01/25/2022   Atrophic kidney, right 01/25/2022   CKD (chronic kidney disease) stage 3, GFR 30-59 ml/min (HCC) 11/11/2021   Coronary artery disease 06/15/2021   Chest pain of uncertain etiology 62/69/4854   Cardiac murmur 06/15/2021   Anemia 06/13/2021   Skin lesion 05/26/2021   GAD (generalized anxiety disorder) 05/01/2020   Black stools 01/13/2020   Unintended weight loss 01/13/2020   Antiplatelet or antithrombotic long-term use 01/13/2020   Hypertension    Frontal balding 12/28/2019   Iron deficiency anemia 12/28/2019   Essential hypertension    Porokeratosis 08/26/2018   PVD (peripheral vascular disease) ([place]) 08/26/2018   Hyperlipidemia  05/02/2018   HTN (hypertension) 05/01/2018   Tobacco dependence 05/01/2018   PAD (peripheral artery disease) ([place])      Current Outpatient Medications on File Prior to Visit  Medication Sig Dispense Refill   amLODipine (NORVASC) 10 MG tablet Take 1 tablet (10 mg total) by mouth daily. 30 tablet 3   aspirin 81 MG chewable tablet Chew 1 tablet (81 mg total) by mouth daily. 100 tablet 1   atorvastatin (LIPITOR) 20 MG tablet Take 1.5 tablets (30 mg total) by mouth daily. 135 tablet 3   Blood Pressure Monitor DEVI Use as directed  to check home blood pressure 2-3 times a week 1 Device 0   carvedilol (COREG) 25 MG tablet Take 1 tablet (25 mg total) by mouth 2 (two) times daily. 180 tablet 3   cilostazol (PLETAL) 100 MG tablet Take 1 tablet (100 mg total) by mouth 2 (two) times daily. 180 tablet 3   ferrous sulfate 325 (65 FE) MG tablet Take 1 tablet (325 mg total) by mouth 2 (two) times daily with a meal. 100 tablet 1   losartan-hydrochlorothiazide (HYZAAR) 100-25 MG tablet Take 1 tablet by mouth daily.     potassium chloride (KLOR-CON) 8 MEQ tablet Take 2 tablets (16 mEq total) by mouth daily. (Patient taking differently: Take 16 mEq by mouth every morning.) 180 tablet 1   nitroGLYCERIN (NITROSTAT) 0.4 MG SL tablet Place 1 tablet (0.4 mg total) under the tongue every 5 (five) minutes as needed. 25 tablet 6   Current Facility-Administered Medications on File Prior to Visit  Medication Dose Route Frequency Provider Last Rate Last Admin   cloNIDine (CATAPRES) tablet 0.1 mg  0.1 mg Oral Once Thereasa Solo, Angela M, PA-C        Allergies  Allergen Reactions   Latex Rash    Social History   Socioeconomic History   Marital status: Single    Spouse name: Not on file   Number of children: 0   Years of education: Not on file   Highest education level: Not on file  Occupational History   Occupation: Prep cook  Tobacco Use   Smoking status: Every Day    Packs/day: 0.50    Types: Cigarettes   Smokeless tobacco: Never   Tobacco comments:    6-10 cigs daily  Vaping Use   Vaping Use: Never used  Substance and Sexual Activity   Alcohol use: Yes    Comment: 1 per day   Drug use: Never   Sexual activity: Not Currently  Other Topics Concern   Not on file  Social History Narrative   Not on file   Social Determinants of Health   Financial Resource Strain: Low Risk  (06/27/2021)   Overall Financial Resource Strain (CARDIA)    Difficulty of Paying Living Expenses: Not hard at all  Food Insecurity: Not on file   Transportation Needs: Not on file  Physical Activity: Not on file  Stress: Not on file  Social Connections: Not on file  Intimate Partner Violence: Not on file    Family History  Problem Relation Age of Onset   Heart disease Mother    Hypertension Mother    Hyperlipidemia Mother    Hypertension Brother    Heart disease Maternal Aunt    Hypertension Maternal Aunt    Hyperlipidemia Maternal Aunt    Colon cancer Neg Hx    Liver cancer Neg Hx    Rectal cancer Neg Hx    Stomach cancer Neg Hx  Past Surgical History:  Procedure Laterality Date   ABDOMINAL AORTOGRAM W/LOWER EXTREMITY Bilateral 07/13/2019   Procedure: ABDOMINAL AORTOGRAM W/LOWER EXTREMITY;  Surgeon: Waynetta Sandy, MD;  Location: [facility];  Service: Cardiovascular;  Laterality: Bilateral;   ABDOMINAL AORTOGRAM W/LOWER EXTREMITY N/A 01/08/2022   Procedure: ABDOMINAL AORTOGRAM W/LOWER EXTREMITY;  Surgeon: Broadus John, MD;  Location: [facility];  Service: Vascular;  Laterality: N/A;   BREAST EXCISIONAL BIOPSY Left    LESS THAN 5 YEARS AGO   PERIPHERAL VASCULAR INTERVENTION Left 07/13/2019   Procedure: PERIPHERAL VASCULAR INTERVENTION;  Surgeon: Waynetta Sandy, MD;  Location: [facility];  Service: Cardiovascular;  Laterality: Left;  common iliac    ROS: Review of Systems Negative except as stated above  PHYSICAL EXAM: BP 114/76 (BP Location: Left Arm, Patient Position: Sitting, Cuff Size: Normal)   Pulse 74   Temp 98.1 F (36.7 C) (Oral)   Ht '5\' 4"'$  (1.626 m)   Wt 138 lb (62.6 kg)   SpO2 98%   BMI 23.69 kg/m   Wt Readings from Last 3 Encounters:  02/08/22 138 lb (62.6 kg)  01/25/22 140 lb (63.5 kg)  01/08/22 140 lb (63.5 kg)    Physical Exam  General appearance - alert, well appearing, older African-American female and in no distress Mental status - normal mood, behavior, speech, dress, motor activity, and thought processes Mouth - mucous membranes  moist, pharynx normal without lesions Neck - supple, no significant adenopathy Chest -clear to auscultation bilaterally. Heart -regular rate and rhythm.  Soft systolic ejection murmur heard in the left and right upper sternal borders. Abdomen - soft, nontender, nondistended, no masses or organomegaly Extremities -no lower extremity edema.  Pulses both feet 2+. Skin -callus at about 3 cm in size on plantar surface laterally proximally and distally      Latest Ref Rng & Units 01/08/2022    6:03 AM 11/10/2021    9:28 AM 03/02/2021    3:23 PM  CMP  Glucose 70 - 99 mg/dL 73  66  66   BUN 8 - 23 mg/dL '23  23  16   '$ Creatinine 0.44 - 1.00 mg/dL 1.60  1.41  0.99   Sodium 135 - 145 mmol/L 139  138  139   Potassium 3.5 - 5.1 mmol/L 3.9  4.7  3.9   Chloride 98 - 111 mmol/L 103  99  98   CO2 20 - 29 mmol/L  28  26   Calcium 8.7 - 10.3 mg/dL  9.4  9.0   Total Protein 6.0 - 8.5 g/dL  7.1  7.0   Total Bilirubin 0.0 - 1.2 mg/dL  <0.2  <0.2   Alkaline Phos 44 - 121 IU/L  56  63   AST 0 - 40 IU/L  22  21   ALT 0 - 32 IU/L  12  8    Lipid Panel     Component Value Date/Time   CHOL 173 03/02/2021 1523   TRIG 105 03/02/2021 1523   HDL 58 03/02/2021 1523   CHOLHDL 3.0 03/02/2021 1523   LDLCALC 96 03/02/2021 1523    CBC    Component Value Date/Time   WBC 4.6 03/02/2021 1523   WBC 5.3 03/23/2020 0813   RBC 3.96 03/02/2021 1523   RBC 3.85 (L) 03/23/2020 0813   HGB 12.9 01/08/2022 0603   HGB 12.4 03/02/2021 1523   HCT 38.0 01/08/2022 0603   HCT 37.2 03/02/2021 1523   PLT  227 03/02/2021 1523   MCV 94 03/02/2021 1523   MCH 31.3 03/02/2021 1523   MCH 31.1 04/10/2018 1203   MCHC 33.3 03/02/2021 1523   MCHC 33.3 03/23/2020 0813   RDW 13.0 03/02/2021 1523   LYMPHSABS 2.3 03/02/2021 1523   EOSABS 0.1 03/02/2021 1523   BASOSABS 0.0 03/02/2021 1523    ASSESSMENT AND PLAN:  1. Essential hypertension Controlled.  Continue current medications including amlodipine, carvedilol and  Diovan/HCTZ. - amLODipine (NORVASC) 10 MG tablet; Take 1 tablet (10 mg total) by mouth daily.  Dispense: 30 tablet; Refill: 3 - carvedilol (COREG) 25 MG tablet; Take 1 tablet (25 mg total) by mouth 2 (two) times daily.  Dispense: 180 tablet; Refill: 3  2. PAD (peripheral artery disease) ([place]) Patient will be undergoing bypass surgery on the right lower extremity later this month.  Stable and optimized for surgery. - cilostazol (PLETAL) 100 MG tablet; Take 1 tablet (100 mg total) by mouth 2 (two) times daily.  Dispense: 180 tablet; Refill: 3  3. Mixed hyperlipidemia - atorvastatin (LIPITOR) 20 MG tablet; Take 1.5 tablets (30 mg total) by mouth daily.  Dispense: 135 tablet; Refill: 3  4. Hypokalemia - potassium chloride (KLOR-CON) 8 MEQ tablet; Take 2 tablets (16 mEq total) by mouth daily.  Dispense: 180 tablet; Refill: 1  5. Kidney lesion, native, left Plan for repeat imaging later this year to ensure lesion remains stable in size.  Urology thinks it is benign.  6. Iron deficiency anemia, unspecified iron deficiency anemia type - CBC - Iron, TIBC and Ferritin Panel  7. Paraspinal mass Saw Dr. Roxan Hockey in follow-up in November.  He thinks this is likely benign given stability in size/appearance over the past 18 months.  Patient opted for surveillance.  Repeat CAT scan in 1 year.  8. Stage 3b chronic kidney disease ([place]) - Basic Metabolic Panel    Patient was given the opportunity to ask questions.  Patient verbalized understanding of the plan and was able to repeat key elements of the plan.   This documentation was completed using Radio producer.  Any transcriptional errors are unintentional.  No orders of the defined types were placed in this encounter.    Requested Prescriptions    No prescriptions requested or ordered in this encounter    No follow-ups on file.  Karle Plumber, MD, FACP

## 2022-02-08 NOTE — Patient Instructions (Signed)
Continue to work on trying to quit smoking

## 2022-02-09 ENCOUNTER — Telehealth: Payer: Self-pay

## 2022-02-09 LAB — CBC
Hematocrit: 40.6 % (ref 34.0–46.6)
Hemoglobin: 13.8 g/dL (ref 11.1–15.9)
MCH: 32.2 pg (ref 26.6–33.0)
MCHC: 34 g/dL (ref 31.5–35.7)
MCV: 95 fL (ref 79–97)
Platelets: 209 10*3/uL (ref 150–450)
RBC: 4.28 x10E6/uL (ref 3.77–5.28)
RDW: 13 % (ref 11.7–15.4)
WBC: 5.5 10*3/uL (ref 3.4–10.8)

## 2022-02-09 LAB — BASIC METABOLIC PANEL
BUN/Creatinine Ratio: 14 (ref 12–28)
BUN: 19 mg/dL (ref 8–27)
CO2: 23 mmol/L (ref 20–29)
Calcium: 9.6 mg/dL (ref 8.7–10.3)
Chloride: 98 mmol/L (ref 96–106)
Creatinine, Ser: 1.35 mg/dL — ABNORMAL HIGH (ref 0.57–1.00)
Glucose: 78 mg/dL (ref 70–99)
Potassium: 4.5 mmol/L (ref 3.5–5.2)
Sodium: 136 mmol/L (ref 134–144)
eGFR: 44 mL/min/{1.73_m2} — ABNORMAL LOW (ref >59)

## 2022-02-09 LAB — IRON,TIBC AND FERRITIN PANEL
Ferritin: 57 ng/mL (ref 15–150)
Iron Saturation: 23 % (ref 15–55)
Iron: 66 ug/dL (ref 27–139)
Total Iron Binding Capacity: 287 ug/dL (ref 250–450)
UIBC: 221 ug/dL (ref 118–369)

## 2022-02-09 NOTE — Telephone Encounter (Signed)
CILOSTAZOL PRIOR AUTH APPROVED UNTIL 02/09/23

## 2022-02-15 NOTE — Pre-Procedure Instructions (Signed)
Surgical Instructions    Your procedure is scheduled on Tuesday, February 20, 2022 at 10:00 AM.  Report to [facility] Main Entrance "A" at 8:00 A.M., then check in with the Admitting office.  Call this number if you have problems the morning of surgery:  [phone]   If you have any questions prior to your surgery date call [phone]: Open Monday-Friday 8am-4pm  *If you experience any cold or flu symptoms such as cough, fever, chills, shortness of breath, etc. between now and your scheduled surgery, please notify us.*    Remember:  Do not eat or drink after midnight the night before your surgery    Take these medicines the morning of surgery with A SIP OF WATER:  amLODipine (NORVASC)  aspirin  atorvastatin (LIPITOR)  carvedilol (COREG)   nitroGLYCERIN (NITROSTAT) - if needed  Follow your surgeon's instructions on when to stop cilostazol (PLETAL).  If no instructions were given by your surgeon then you will need to call the office to get those instructions.    As of today, STOP taking any Aleve, Naproxen, Ibuprofen, Motrin, Advil, Goody's, BC's, all herbal medications, fish oil, and all vitamins.                     Do NOT Smoke (Tobacco/Vaping) for 24 hours prior to your procedure.  If you use a CPAP at night, you may bring your mask/headgear for your overnight stay.   Contacts, glasses, piercing's, hearing aid's, dentures or partials may not be worn into surgery, please bring cases for these belongings.    For patients admitted to the hospital, discharge time will be determined by your treatment team.   Patients discharged the day of surgery will not be allowed to drive home, and someone needs to stay with them for 24 hours.  SURGICAL WAITING ROOM VISITATION Patients having surgery or a procedure may have two support people in the waiting area. Visitors may stay in the waiting area during the procedure and switch out with other visitors if needed. Only 1 support  person is allowed in the pre-op area with the patient AFTER the patient is prepped. This person cannot be switched out. Children under the age of 74 must have an adult accompany them who is not the patient. If the patient needs to stay at the hospital during part of their recovery, the visitor guidelines for inpatient rooms apply.  Please refer to the [facility] website for the visitor guidelines for Inpatients (after your surgery is over and you are in a regular room).    Special instructions:   [place]- Preparing For Surgery  Before surgery, you can play an important role. Because skin is not sterile, your skin needs to be as free of germs as possible. You can reduce the number of germs on your skin by washing with CHG (chlorahexidine gluconate) Soap before surgery.  CHG is an antiseptic cleaner which kills germs and bonds with the skin to continue killing germs even after washing.    Oral Hygiene is also important to reduce your risk of infection.  Remember - BRUSH YOUR TEETH THE MORNING OF SURGERY WITH YOUR REGULAR TOOTHPASTE  Please do not use if you have an allergy to CHG or antibacterial soaps. If your skin becomes reddened/irritated stop using the CHG.  Do not shave (including legs and underarms) for at least 48 hours prior to first CHG shower. It is OK to shave your face.  Please follow these instructions carefully.  Shower the NIGHT BEFORE SURGERY and the MORNING OF SURGERY  If you chose to wash your hair, wash your hair first as usual with your normal shampoo.  After you shampoo, rinse your hair and body thoroughly to remove the shampoo.  Use CHG Soap as you would any other liquid soap. You can apply CHG directly to the skin and wash gently with a scrungie or a clean washcloth.   Apply the CHG Soap to your body ONLY FROM THE NECK DOWN.  Do not use on open wounds or open sores. Avoid contact with your eyes, ears, mouth and genitals (private parts). Wash Face and genitals  (private parts)  with your normal soap.   Wash thoroughly, paying special attention to the area where your surgery will be performed.  Thoroughly rinse your body with warm water from the neck down.  DO NOT shower/wash with your normal soap after using and rinsing off the CHG Soap.  Pat yourself dry with a CLEAN TOWEL.  Wear CLEAN PAJAMAS to bed the night before surgery  Place CLEAN SHEETS on your bed the night before your surgery  DO NOT SLEEP WITH PETS.   Day of Surgery: Take a shower with CHG soap. Do not wear jewelry or makeup Do not wear lotions, powders, perfumes/colognes, or deodorant. Do not shave 48 hours prior to surgery. Do not wear nail polish, gel polish, artificial nails, or any other type of covering on natural nails (fingers and toes) If you have artificial nails or gel coating that need to be removed by a nail salon, please have this removed prior to surgery. Artificial nails or gel coating may interfere with anesthesia's ability to adequately monitor your vital signs. Wear Clean/Comfortable clothing the morning of surgery Do not bring valuables to the hospital.  [facility] is not responsible for any belongings or valuables. Remember to brush your teeth WITH YOUR REGULAR TOOTHPASTE.   Please read over the following fact sheets that you were given.  If you received a COVID test during your pre-op visit  it is requested that you wear a mask when out in public, stay away from anyone that may not be feeling well and notify your surgeon if you develop symptoms. If you have been in contact with anyone that has tested positive in the last 10 days please notify you surgeon.

## 2022-02-16 ENCOUNTER — Encounter (HOSPITAL_COMMUNITY): Payer: Self-pay

## 2022-02-16 ENCOUNTER — Encounter (HOSPITAL_COMMUNITY)
Admission: RE | Admit: 2022-02-16 | Discharge: 2022-02-16 | Disposition: A | Discharge status: Home / Self Care | Payer: Medicaid Other | Source: Ambulatory Visit | Attending: Vascular Surgery | Admitting: Vascular Surgery

## 2022-02-16 ENCOUNTER — Other Ambulatory Visit: Payer: Self-pay

## 2022-02-16 VITALS — BP 105/70 | HR 75 | Temp 97.9°F | Resp 18 | Ht 64.0 in | Wt 137.6 lb

## 2022-02-16 DIAGNOSIS — I2510 Atherosclerotic heart disease of native coronary artery without angina pectoris: Secondary | ICD-10-CM | POA: Diagnosis not present

## 2022-02-16 DIAGNOSIS — N1832 Chronic kidney disease, stage 3b: Secondary | ICD-10-CM | POA: Insufficient documentation

## 2022-02-16 DIAGNOSIS — Z01812 Encounter for preprocedural laboratory examination: Secondary | ICD-10-CM | POA: Diagnosis not present

## 2022-02-16 DIAGNOSIS — I739 Peripheral vascular disease, unspecified: Secondary | ICD-10-CM

## 2022-02-16 DIAGNOSIS — E785 Hyperlipidemia, unspecified: Secondary | ICD-10-CM | POA: Diagnosis not present

## 2022-02-16 DIAGNOSIS — I700 Atherosclerosis of aorta: Secondary | ICD-10-CM | POA: Diagnosis not present

## 2022-02-16 DIAGNOSIS — I129 Hypertensive chronic kidney disease with stage 1 through stage 4 chronic kidney disease, or unspecified chronic kidney disease: Secondary | ICD-10-CM | POA: Insufficient documentation

## 2022-02-16 DIAGNOSIS — Z01818 Encounter for other preprocedural examination: Secondary | ICD-10-CM

## 2022-02-16 DIAGNOSIS — J439 Emphysema, unspecified: Secondary | ICD-10-CM | POA: Diagnosis not present

## 2022-02-16 DIAGNOSIS — I080 Rheumatic disorders of both mitral and aortic valves: Secondary | ICD-10-CM | POA: Diagnosis not present

## 2022-02-16 HISTORY — DX: Prediabetes: R73.03

## 2022-02-16 HISTORY — DX: Chronic kidney disease, unspecified: N18.9

## 2022-02-16 LAB — URINALYSIS, ROUTINE W REFLEX MICROSCOPIC
Bacteria, UA: NONE SEEN
Bilirubin Urine: NEGATIVE
Glucose, UA: NEGATIVE mg/dL
Hgb urine dipstick: NEGATIVE
Ketones, ur: 5 mg/dL — AB
Leukocytes,Ua: NEGATIVE
Nitrite: NEGATIVE
Protein, ur: 30 mg/dL — AB
Specific Gravity, Urine: 1.02 (ref 1.005–1.030)
pH: 5 (ref 5.0–8.0)

## 2022-02-16 LAB — COMPREHENSIVE METABOLIC PANEL
ALT: 13 U/L (ref 0–44)
AST: 24 U/L (ref 15–41)
Albumin: 3.8 g/dL (ref 3.5–5.0)
Alkaline Phosphatase: 53 U/L (ref 38–126)
Anion gap: 6 (ref 5–15)
BUN: 17 mg/dL (ref 8–23)
CO2: 28 mmol/L (ref 22–32)
Calcium: 8.8 mg/dL — ABNORMAL LOW (ref 8.9–10.3)
Chloride: 102 mmol/L (ref 98–111)
Creatinine, Ser: 1.44 mg/dL — ABNORMAL HIGH (ref 0.44–1.00)
GFR, Estimated: 41 mL/min — ABNORMAL LOW (ref >60)
Glucose, Bld: 100 mg/dL — ABNORMAL HIGH (ref 70–99)
Potassium: 4.4 mmol/L (ref 3.5–5.1)
Sodium: 136 mmol/L (ref 135–145)
Total Bilirubin: 0.3 mg/dL (ref 0.3–1.2)
Total Protein: 7.4 g/dL (ref 6.5–8.1)

## 2022-02-16 LAB — CBC
HCT: 41 % (ref 36.0–46.0)
Hemoglobin: 13.7 g/dL (ref 12.0–15.0)
MCH: 33.3 pg (ref 26.0–34.0)
MCHC: 33.4 g/dL (ref 30.0–36.0)
MCV: 99.5 fL (ref 80.0–100.0)
Platelets: 194 10*3/uL (ref 150–400)
RBC: 4.12 MIL/uL (ref 3.87–5.11)
RDW: 13.5 % (ref 11.5–15.5)
WBC: 4.9 10*3/uL (ref 4.0–10.5)
nRBC: 0 % (ref 0.0–0.2)

## 2022-02-16 LAB — TYPE AND SCREEN
ABO/RH(D): B POS
Antibody Screen: NEGATIVE

## 2022-02-16 LAB — PROTIME-INR
INR: 1.1 (ref 0.8–1.2)
Prothrombin Time: 13.8 seconds (ref 11.4–15.2)

## 2022-02-16 LAB — APTT: aPTT: 28 seconds (ref 24–36)

## 2022-02-16 NOTE — Progress Notes (Signed)
PCP - Dr. Karle Plumber Cardiologist - Denies Urologist: Dr. Michaelle Birks  PPM/ICD - Denies  Chest x-ray -  EKG - 06/15/21 Stress Test - 06/23/21 ECHO - 07/05/21 Cardiac Cath -   Sleep Study - Denies  Diabetes: Pre-diabetic  Blood Thinner Instructions: LD of Pletal: 02/13/22 Aspirin Instructions:   ERAS Protcol - No  COVID TEST- N/A   Anesthesia review: Yes, cardiac hx  Patient denies shortness of breath, fever, cough and chest pain at PAT appointment   All instructions explained to the patient, with a verbal understanding of the material. Patient agrees to go over the instructions while at home for a better understanding. Patient also instructed to self quarantine after being tested for COVID-19. The opportunity to ask questions was provided.

## 2022-02-19 ENCOUNTER — Encounter (HOSPITAL_COMMUNITY): Payer: Self-pay

## 2022-02-19 NOTE — Progress Notes (Signed)
Anesthesia Chart Review:  Case: [id number] Date/Time: 02/20/22 1045   Procedures:      RIGHT COMMON FEMORAL ENDARTERECTOMY (Right)     FEMORAL-ANTERIOR TIBIAL ARTERY BYPASS VERSUS FEMORAL-POPLITEAL ARTERY BYPASS (Right)   Anesthesia type: General   Pre-op diagnosis: PAD   Location: [facility] OR ROOM 12 / [place] [facility]   Surgeons: Waynetta Sandy, MD       DISCUSSION: Patient is a 62 year old female scheduled for the above procedure.  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 Felipa Eth), mediastinal mass (followed by CT surgery, patient opted for surveillance imaging).  Last PCP routine follow-up was on 02/08/22 with Dr. Wynetta Emery. HTN controlled. On statin for HLD. Following IDA and CKD. Having surveillance imaging per CT surgery and urology for mediastinal and left kidney lesions. She is aware of surgery plans.   Last visit with cardiologist Dr. Geraldo Pitter was on 06/15/21 for episode of chest pain when BP elevated. Also with murmur on exam. Echo in June 2023 showed normal LVEF, no regional wall motion abnormalities, mild asymmetric LV hypertrophy of the basal septal segment, grade 1 diastolic dysfunction, normal RV systolic function, normal PASP, mild MR, mild AS. She had a non-ischemic stress test on 06/23/21.    Noted BP documented as 84/65, reportedly BP checked in the other arm (laterality not specified) and was 105/70. Reported last Pletal 02/13/22. Anesthesia team to evaluate on the day of surgery.    VS: BP 105/70   Pulse 75   Temp 36.6 C (Oral)   Resp 18   Ht '5\' 4"'$  (1.626 m)   Wt 62.4 kg   SpO2 100%   BMI 23.62 kg/m  BP Readings from Last 3 Encounters:  02/16/22 (!) 84/65  02/08/22 114/76  01/25/22 (!) 169/102      PROVIDERS: Ladell Pier, MD is PCP ([place]) - Revankar, Sunny Schlein,  MD is cardiologist - Servando Snare, MD is vascular surgeon - Modesto Charon, MD is CT surgeon. 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 One year follow-up with chest CT planned.  Michaelle Birks, MD is urologist. Last visit 01/25/22 for atrophic right kidney and left renal mass that had been stable for 18 months, and felt likely benign. Repeat six month imaging planned ~ April 2024.    LABS: Labs reviewed: Acceptable for surgery. Renal function appears stable. A1c 5.7% 11/10/21.  (all labs ordered are listed, but only abnormal results are displayed)  Labs Reviewed  COMPREHENSIVE METABOLIC PANEL - Abnormal; Notable for the following components:      Result Value   Glucose, Bld 100 (*)    Creatinine, Ser 1.44 (*)    Calcium 8.8 (*)    GFR, Estimated 41 (*)    All other components within normal limits  URINALYSIS, ROUTINE W REFLEX MICROSCOPIC - Abnormal; Notable for the following components:   Ketones, ur 5 (*)    Protein, ur 30 (*)    All other components within normal limits  SURGICAL PCR SCREEN  CBC  PROTIME-INR  APTT  TYPE AND SCREEN     IMAGES: MRI Abd 11/03/21: IMPRESSION: 1. Rounded, intermediate signal, nonenhancing lesion which is closely apposed to the medial spleen, superior pole of the left kidney, and left adrenal gland does not appear to be fluid  in character and measures 2.0 x 1.3 cm. This is of uncertain nature although almost certainly benign given established stability and lack of contrast enhancement. 2. Small focus of arterial hyperenhancement of the liver dome, hepatic segment VIII, measuring 1.1 x 0.8 cm, which isoenhancing on subsequent contrast enhanced phases and without underlying intrinsic signal abnormality. This may reflect a benign transient hepatic intensity difference or perhaps a small focal nodular hyperplasia, although  almost certainly benign and of no significance in the absence of known chronic high risk liver disease. 3. Markedly atrophic right kidney consistent with prior infectious, obstructive, or ischemic insult.   CT Chest lung cancer screening 09/29/21: IMPRESSION: 1. Lung-RADS 2, benign appearance or behavior. Continue annual screening with low-dose chest CT without contrast in 12 months. 2. Indeterminate 3.2 x 1.6 cm soft tissue mass in the superior aspect of the posterior left mediastinum/medial left pleural space, stable from 06/09/2020 CT, differential includes neurogenic tumor versus solitary fibrous tumor of the pleura, as reported previously. 3. Indeterminate exophytic isodense 1.7 cm upper left renal cortical lesion, stable from 06/17/2020 CT, cannot exclude indolent renal neoplasm. MRI (preferred) or CT abdomen without and with IV contrast may be considered for further characterization at this time. 4. Three-vessel coronary atherosclerosis. 5. Aortic Atherosclerosis (ICD10-I70.0) and Emphysema (ICD10-J43.9).   EKG: 06/15/21 ([facility]): Normal sinus rhythm Possible left atrial enlargement Anteroseptal infarct, age undetermined   CV: Echo 07/05/21: IMPRESSIONS   1. Left ventricular ejection fraction by 3D volume is 55 %. The left  ventricle has normal function. The left ventricle has no regional wall  motion abnormalities. There is mild asymmetric left ventricular  hypertrophy of the basal-septal segment. Left  ventricular diastolic parameters are consistent with Grade I diastolic  dysfunction (impaired relaxation).   2. Right ventricular systolic function is normal. The right ventricular  size is normal. There is normal pulmonary artery systolic pressure. The  estimated right ventricular systolic pressure is 10.2 mmHg.   3. The mitral valve is grossly normal. Mild mitral valve regurgitation.  No evidence of mitral stenosis.   4. The aortic valve is tricuspid. There is  moderate calcification of the  aortic valve. There is moderate thickening of the aortic valve. Aortic  valve regurgitation is not visualized. Mild aortic valve stenosis. Aortic  valve area, by VTI measures 1.20  cm. Aortic valve mean gradient measures 13.0 mmHg. Aortic valve Vmax  measures 2.51 m/s.   5. The inferior vena cava is normal in size with greater than 50%  respiratory variability, suggesting right atrial pressure of 3 mmHg.    Nuclear stress test 06/23/21:   The study is normal. Findings are consistent with no prior ischemia and no prior myocardial infarction. The study is low risk.   No ST deviation was noted.   LV perfusion is normal. There is no evidence of ischemia. There is no evidence of infarction.   Left ventricular function is normal. Nuclear stress EF: 54 %. The left ventricular ejection fraction is mildly decreased (45-54%). End diastolic cavity size is normal. End systolic cavity size is normal.   Prior study not available for comparison.     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 Roxan Hockey, patient opted for surveillance imaging (12/12/21)   PAD (peripheral artery disease) ([place])    Porokeratosis 08/26/2018   Pre-diabetes    PVD (peripheral vascular disease) ([place]) 08/26/2018   Skin lesion 05/26/2021   Tobacco dependence 05/01/2018   Unintended weight loss 01/13/2020    Past Surgical History:  Procedure Laterality Date   ABDOMINAL AORTOGRAM W/LOWER EXTREMITY Bilateral  07/13/2019   Procedure: ABDOMINAL AORTOGRAM W/LOWER EXTREMITY;  Surgeon: Waynetta Sandy, MD;  Location: [facility];  Service: Cardiovascular;  Laterality: Bilateral;   ABDOMINAL AORTOGRAM W/LOWER EXTREMITY N/A 01/08/2022   Procedure: ABDOMINAL AORTOGRAM W/LOWER EXTREMITY;  Surgeon: Broadus John, MD;  Location: [place];  Service: Vascular;  Laterality: N/A;   BREAST EXCISIONAL BIOPSY Left    LESS THAN 5 YEARS AGO   PERIPHERAL VASCULAR INTERVENTION Left 07/13/2019   Procedure: PERIPHERAL VASCULAR INTERVENTION;  Surgeon: Waynetta Sandy, MD;  Location: [facility];  Service: Cardiovascular;  Laterality: Left;  common iliac    MEDICATIONS:  amLODipine (NORVASC) 10 MG tablet   aspirin 81 MG chewable tablet   atorvastatin (LIPITOR) 20 MG tablet   Blood Pressure Monitor DEVI   carvedilol (COREG) 25 MG tablet   cholecalciferol (VITAMIN D3) 25 MCG (1000 UNIT) tablet   cilostazol (PLETAL) 100 MG tablet   ferrous sulfate 325 (65 FE) MG tablet   nitroGLYCERIN (NITROSTAT) 0.4 MG SL tablet   potassium chloride (KLOR-CON) 8 MEQ tablet   valsartan-hydrochlorothiazide (DIOVAN-HCT) 320-25 MG tablet    cloNIDine (CATAPRES) tablet 0.1 mg    Myra Gianotti, PA-C Surgical Short Stay/Anesthesiology [facility] Phone [phone] [facility] Phone [phone] 02/19/2022 11:32 AM

## 2022-02-19 NOTE — Progress Notes (Signed)
Spoke with the pt, she will arrive tom at 0730.

## 2022-02-20 ENCOUNTER — Encounter (HOSPITAL_COMMUNITY)
Admission: RE | Disposition: A | Discharge status: Home / Self Care | Payer: Self-pay | Source: Ambulatory Visit | Attending: Vascular Surgery

## 2022-02-20 ENCOUNTER — Inpatient Hospital Stay (HOSPITAL_COMMUNITY): Payer: Medicaid Other

## 2022-02-20 ENCOUNTER — Other Ambulatory Visit: Payer: Self-pay

## 2022-02-20 ENCOUNTER — Inpatient Hospital Stay (HOSPITAL_COMMUNITY)
Admission: RE | Admit: 2022-02-20 | Discharge: 2022-02-21 | DRG: 272 | Disposition: A | Discharge status: Home / Self Care | Payer: Medicaid Other | Source: Ambulatory Visit | Attending: Vascular Surgery | Admitting: Vascular Surgery

## 2022-02-20 ENCOUNTER — Inpatient Hospital Stay (HOSPITAL_COMMUNITY): Payer: Medicaid Other | Admitting: Vascular Surgery

## 2022-02-20 ENCOUNTER — Encounter (HOSPITAL_COMMUNITY): Payer: Self-pay | Admitting: Vascular Surgery

## 2022-02-20 ENCOUNTER — Inpatient Hospital Stay (HOSPITAL_COMMUNITY): Payer: Medicaid Other | Admitting: Certified Registered"

## 2022-02-20 DIAGNOSIS — I739 Peripheral vascular disease, unspecified: Principal | ICD-10-CM | POA: Diagnosis present

## 2022-02-20 DIAGNOSIS — I2510 Atherosclerotic heart disease of native coronary artery without angina pectoris: Secondary | ICD-10-CM | POA: Diagnosis not present

## 2022-02-20 DIAGNOSIS — M329 Systemic lupus erythematosus, unspecified: Secondary | ICD-10-CM | POA: Diagnosis not present

## 2022-02-20 DIAGNOSIS — D509 Iron deficiency anemia, unspecified: Secondary | ICD-10-CM | POA: Diagnosis present

## 2022-02-20 DIAGNOSIS — Z8249 Family history of ischemic heart disease and other diseases of the circulatory system: Secondary | ICD-10-CM

## 2022-02-20 DIAGNOSIS — Q828 Other specified congenital malformations of skin: Secondary | ICD-10-CM | POA: Diagnosis not present

## 2022-02-20 DIAGNOSIS — L84 Corns and callosities: Secondary | ICD-10-CM | POA: Diagnosis present

## 2022-02-20 DIAGNOSIS — I70222 Atherosclerosis of native arteries of extremities with rest pain, left leg: Secondary | ICD-10-CM | POA: Diagnosis present

## 2022-02-20 DIAGNOSIS — I10 Essential (primary) hypertension: Secondary | ICD-10-CM | POA: Diagnosis not present

## 2022-02-20 DIAGNOSIS — F17200 Nicotine dependence, unspecified, uncomplicated: Secondary | ICD-10-CM | POA: Diagnosis not present

## 2022-02-20 DIAGNOSIS — Z83438 Family history of other disorder of lipoprotein metabolism and other lipidemia: Secondary | ICD-10-CM

## 2022-02-20 DIAGNOSIS — Z91040 Latex allergy status: Secondary | ICD-10-CM

## 2022-02-20 DIAGNOSIS — F17210 Nicotine dependence, cigarettes, uncomplicated: Secondary | ICD-10-CM

## 2022-02-20 DIAGNOSIS — F411 Generalized anxiety disorder: Secondary | ICD-10-CM | POA: Diagnosis present

## 2022-02-20 DIAGNOSIS — I70221 Atherosclerosis of native arteries of extremities with rest pain, right leg: Secondary | ICD-10-CM | POA: Diagnosis not present

## 2022-02-20 DIAGNOSIS — I129 Hypertensive chronic kidney disease with stage 1 through stage 4 chronic kidney disease, or unspecified chronic kidney disease: Secondary | ICD-10-CM | POA: Diagnosis not present

## 2022-02-20 DIAGNOSIS — N189 Chronic kidney disease, unspecified: Secondary | ICD-10-CM | POA: Diagnosis not present

## 2022-02-20 DIAGNOSIS — E785 Hyperlipidemia, unspecified: Secondary | ICD-10-CM | POA: Diagnosis present

## 2022-02-20 HISTORY — PX: ENDARTERECTOMY FEMORAL: SHX5804

## 2022-02-20 LAB — CBC
HCT: 38.3 % (ref 36.0–46.0)
Hemoglobin: 12.6 g/dL (ref 12.0–15.0)
MCH: 32.7 pg (ref 26.0–34.0)
MCHC: 32.9 g/dL (ref 30.0–36.0)
MCV: 99.5 fL (ref 80.0–100.0)
Platelets: 174 10*3/uL (ref 150–400)
RBC: 3.85 MIL/uL — ABNORMAL LOW (ref 3.87–5.11)
RDW: 13.5 % (ref 11.5–15.5)
WBC: 6.5 10*3/uL (ref 4.0–10.5)
nRBC: 0 % (ref 0.0–0.2)

## 2022-02-20 LAB — SURGICAL PCR SCREEN
MRSA, PCR: NEGATIVE
Staphylococcus aureus: NEGATIVE

## 2022-02-20 LAB — CREATININE, SERUM
Creatinine, Ser: 1.29 mg/dL — ABNORMAL HIGH (ref 0.44–1.00)
GFR, Estimated: 47 mL/min — ABNORMAL LOW (ref >60)

## 2022-02-20 LAB — ABO/RH: ABO/RH(D): B POS

## 2022-02-20 SURGERY — ENDARTERECTOMY, FEMORAL
Anesthesia: General | Site: Leg Upper | Laterality: Right

## 2022-02-20 MED ORDER — HYDROCHLOROTHIAZIDE 25 MG PO TABS
25.0000 mg | ORAL_TABLET | Freq: Every day | ORAL | Status: DC
  Administered 2022-02-21: 25 mg via ORAL
  Filled 2022-02-20: qty 1

## 2022-02-20 MED ORDER — FENTANYL CITRATE (PF) 250 MCG/5ML IJ SOLN
INTRAMUSCULAR | Status: DC | PRN
  Administered 2022-02-20: 50 ug via INTRAVENOUS
  Administered 2022-02-20 (×2): 100 ug via INTRAVENOUS

## 2022-02-20 MED ORDER — HEPARIN SODIUM (PORCINE) 5000 UNIT/ML IJ SOLN
5000.0000 [IU] | Freq: Three times a day (TID) | INTRAMUSCULAR | Status: DC
  Administered 2022-02-21: 5000 [IU] via SUBCUTANEOUS
  Filled 2022-02-20: qty 1

## 2022-02-20 MED ORDER — ROCURONIUM BROMIDE 10 MG/ML (PF) SYRINGE
PREFILLED_SYRINGE | INTRAVENOUS | Status: AC
  Filled 2022-02-20: qty 10

## 2022-02-20 MED ORDER — HYDRALAZINE HCL 20 MG/ML IJ SOLN: 5.0000 mg | INTRAMUSCULAR | Status: DC | PRN

## 2022-02-20 MED ORDER — HYDROMORPHONE HCL 1 MG/ML IJ SOLN
INTRAMUSCULAR | Status: AC
  Filled 2022-02-20: qty 0.5

## 2022-02-20 MED ORDER — HEPARIN SODIUM (PORCINE) 1000 UNIT/ML IJ SOLN
INTRAMUSCULAR | Status: AC
  Filled 2022-02-20: qty 10

## 2022-02-20 MED ORDER — SUGAMMADEX SODIUM 200 MG/2ML IV SOLN
INTRAVENOUS | Status: DC | PRN
  Administered 2022-02-20: 200 mg via INTRAVENOUS

## 2022-02-20 MED ORDER — CARVEDILOL 25 MG PO TABS
25.0000 mg | ORAL_TABLET | Freq: Two times a day (BID) | ORAL | Status: DC
  Administered 2022-02-20 – 2022-02-21 (×2): 25 mg via ORAL
  Filled 2022-02-20 (×2): qty 1

## 2022-02-20 MED ORDER — AMLODIPINE BESYLATE 10 MG PO TABS
10.0000 mg | ORAL_TABLET | Freq: Every day | ORAL | Status: DC
  Administered 2022-02-21: 10 mg via ORAL
  Filled 2022-02-20: qty 1

## 2022-02-20 MED ORDER — NITROGLYCERIN 0.4 MG SL SUBL: 0.4000 mg | SUBLINGUAL_TABLET | SUBLINGUAL | Status: DC | PRN

## 2022-02-20 MED ORDER — OXYCODONE HCL 5 MG PO TABS: 5.0000 mg | ORAL_TABLET | ORAL | Status: DC | PRN

## 2022-02-20 MED ORDER — ACETAMINOPHEN 650 MG RE SUPP: 325.0000 mg | RECTAL | Status: DC | PRN

## 2022-02-20 MED ORDER — 0.9 % SODIUM CHLORIDE (POUR BTL) OPTIME
TOPICAL | Status: DC | PRN
  Administered 2022-02-20: 20000 mL

## 2022-02-20 MED ORDER — EPHEDRINE SULFATE-NACL 50-0.9 MG/10ML-% IV SOSY
PREFILLED_SYRINGE | INTRAVENOUS | Status: DC | PRN
  Administered 2022-02-20: 10 mg via INTRAVENOUS
  Administered 2022-02-20: 5 mg via INTRAVENOUS

## 2022-02-20 MED ORDER — SODIUM CHLORIDE 0.9 % IV SOLN: 500.0000 mL | Freq: Once | INTRAVENOUS | Status: DC | PRN

## 2022-02-20 MED ORDER — ASPIRIN 81 MG PO CHEW
81.0000 mg | CHEWABLE_TABLET | Freq: Every day | ORAL | Status: DC
  Administered 2022-02-21: 81 mg via ORAL
  Filled 2022-02-20: qty 1

## 2022-02-20 MED ORDER — HEPARIN 6000 UNIT IRRIGATION SOLUTION
Status: AC
  Filled 2022-02-20: qty 500

## 2022-02-20 MED ORDER — CEFAZOLIN SODIUM-DEXTROSE 2-4 GM/100ML-% IV SOLN
2.0000 g | Freq: Three times a day (TID) | INTRAVENOUS | Status: AC
  Administered 2022-02-20 – 2022-02-21 (×2): 2 g via INTRAVENOUS
  Filled 2022-02-20 (×2): qty 100

## 2022-02-20 MED ORDER — ACETAMINOPHEN 325 MG PO TABS: 325.0000 mg | ORAL_TABLET | ORAL | Status: DC | PRN

## 2022-02-20 MED ORDER — LABETALOL HCL 5 MG/ML IV SOLN
10.0000 mg | INTRAVENOUS | Status: DC | PRN
  Administered 2022-02-21: 10 mg via INTRAVENOUS
  Filled 2022-02-20: qty 4

## 2022-02-20 MED ORDER — LABETALOL HCL 5 MG/ML IV SOLN
INTRAVENOUS | Status: AC
  Filled 2022-02-20: qty 4

## 2022-02-20 MED ORDER — DEXAMETHASONE SODIUM PHOSPHATE 10 MG/ML IJ SOLN
INTRAMUSCULAR | Status: DC | PRN
  Administered 2022-02-20: 10 mg via INTRAVENOUS

## 2022-02-20 MED ORDER — FENTANYL CITRATE (PF) 100 MCG/2ML IJ SOLN
25.0000 ug | INTRAMUSCULAR | Status: DC | PRN
  Administered 2022-02-20 (×3): 50 ug via INTRAVENOUS

## 2022-02-20 MED ORDER — ONDANSETRON HCL 4 MG/2ML IJ SOLN: 4.0000 mg | Freq: Once | INTRAMUSCULAR | Status: DC | PRN

## 2022-02-20 MED ORDER — METOPROLOL TARTRATE 5 MG/5ML IV SOLN
INTRAVENOUS | Status: DC | PRN
  Administered 2022-02-20: 3 mg via INTRAVENOUS

## 2022-02-20 MED ORDER — ONDANSETRON HCL 4 MG/2ML IJ SOLN: 4.0000 mg | Freq: Four times a day (QID) | INTRAMUSCULAR | Status: DC | PRN

## 2022-02-20 MED ORDER — OXYCODONE HCL 5 MG/5ML PO SOLN: 5.0000 mg | Freq: Once | ORAL | Status: DC | PRN

## 2022-02-20 MED ORDER — VALSARTAN-HYDROCHLOROTHIAZIDE 320-25 MG PO TABS: 1.0000 | ORAL_TABLET | Freq: Every day | ORAL | Status: DC

## 2022-02-20 MED ORDER — HEMOSTATIC AGENTS (NO CHARGE) OPTIME
TOPICAL | Status: DC | PRN
  Administered 2022-02-20: 1 via TOPICAL

## 2022-02-20 MED ORDER — DOCUSATE SODIUM 100 MG PO CAPS
100.0000 mg | ORAL_CAPSULE | Freq: Every day | ORAL | Status: DC
  Administered 2022-02-21: 100 mg via ORAL
  Filled 2022-02-20: qty 1

## 2022-02-20 MED ORDER — OXYCODONE HCL 5 MG PO TABS: 5.0000 mg | ORAL_TABLET | Freq: Once | ORAL | Status: DC | PRN

## 2022-02-20 MED ORDER — DEXMEDETOMIDINE HCL IN NACL 80 MCG/20ML IV SOLN
INTRAVENOUS | Status: AC
  Filled 2022-02-20: qty 20

## 2022-02-20 MED ORDER — METOPROLOL TARTRATE 5 MG/5ML IV SOLN: 2.0000 mg | INTRAVENOUS | Status: DC | PRN

## 2022-02-20 MED ORDER — MIDAZOLAM HCL 2 MG/2ML IJ SOLN
INTRAMUSCULAR | Status: AC
  Filled 2022-02-20: qty 2

## 2022-02-20 MED ORDER — ATORVASTATIN CALCIUM 10 MG PO TABS
30.0000 mg | ORAL_TABLET | Freq: Every day | ORAL | Status: DC
  Administered 2022-02-21: 30 mg via ORAL
  Filled 2022-02-20: qty 3

## 2022-02-20 MED ORDER — PROPOFOL 10 MG/ML IV BOLUS
INTRAVENOUS | Status: DC | PRN
  Administered 2022-02-20: 150 mg via INTRAVENOUS
  Administered 2022-02-20: 30 mg via INTRAVENOUS

## 2022-02-20 MED ORDER — DEXAMETHASONE SODIUM PHOSPHATE 10 MG/ML IJ SOLN
INTRAMUSCULAR | Status: AC
  Filled 2022-02-20: qty 1

## 2022-02-20 MED ORDER — PANTOPRAZOLE SODIUM 40 MG PO TBEC
40.0000 mg | DELAYED_RELEASE_TABLET | Freq: Every day | ORAL | Status: DC
  Administered 2022-02-20 – 2022-02-21 (×2): 40 mg via ORAL
  Filled 2022-02-20 (×2): qty 1

## 2022-02-20 MED ORDER — FENTANYL CITRATE (PF) 100 MCG/2ML IJ SOLN
INTRAMUSCULAR | Status: AC
  Filled 2022-02-20: qty 2

## 2022-02-20 MED ORDER — MORPHINE SULFATE (PF) 2 MG/ML IV SOLN: 2.0000 mg | INTRAVENOUS | Status: DC | PRN

## 2022-02-20 MED ORDER — HYDROMORPHONE HCL 1 MG/ML IJ SOLN
INTRAMUSCULAR | Status: DC | PRN
  Administered 2022-02-20: .5 mg via INTRAVENOUS

## 2022-02-20 MED ORDER — BISACODYL 10 MG RE SUPP: 10.0000 mg | Freq: Every day | RECTAL | Status: DC | PRN

## 2022-02-20 MED ORDER — HEPARIN 6000 UNIT IRRIGATION SOLUTION
Status: DC | PRN
  Administered 2022-02-20: 1

## 2022-02-20 MED ORDER — LIDOCAINE 2% (20 MG/ML) 5 ML SYRINGE
INTRAMUSCULAR | Status: AC
  Filled 2022-02-20: qty 5

## 2022-02-20 MED ORDER — MAGNESIUM SULFATE 2 GM/50ML IV SOLN: 2.0000 g | Freq: Every day | INTRAVENOUS | Status: DC | PRN

## 2022-02-20 MED ORDER — GUAIFENESIN-DM 100-10 MG/5ML PO SYRP: 15.0000 mL | ORAL_SOLUTION | ORAL | Status: DC | PRN

## 2022-02-20 MED ORDER — METOPROLOL TARTRATE 5 MG/5ML IV SOLN
INTRAVENOUS | Status: AC
  Filled 2022-02-20: qty 5

## 2022-02-20 MED ORDER — PROTAMINE SULFATE 10 MG/ML IV SOLN
INTRAVENOUS | Status: AC
  Filled 2022-02-20: qty 25

## 2022-02-20 MED ORDER — DEXMEDETOMIDINE HCL IN NACL 80 MCG/20ML IV SOLN
INTRAVENOUS | Status: DC | PRN
  Administered 2022-02-20 (×2): 10 ug via INTRAVENOUS

## 2022-02-20 MED ORDER — PROTAMINE SULFATE 10 MG/ML IV SOLN
INTRAVENOUS | Status: DC | PRN
  Administered 2022-02-20: 50 mg via INTRAVENOUS

## 2022-02-20 MED ORDER — POLYETHYLENE GLYCOL 3350 17 G PO PACK: 17.0000 g | PACK | Freq: Every day | ORAL | Status: DC | PRN

## 2022-02-20 MED ORDER — LIDOCAINE 2% (20 MG/ML) 5 ML SYRINGE
INTRAMUSCULAR | Status: DC | PRN
  Administered 2022-02-20: 60 mg via INTRAVENOUS

## 2022-02-20 MED ORDER — ONDANSETRON HCL 4 MG/2ML IJ SOLN
INTRAMUSCULAR | Status: DC | PRN
  Administered 2022-02-20: 4 mg via INTRAVENOUS

## 2022-02-20 MED ORDER — ONDANSETRON HCL 4 MG/2ML IJ SOLN
INTRAMUSCULAR | Status: AC
  Filled 2022-02-20: qty 2

## 2022-02-20 MED ORDER — HEPARIN SODIUM (PORCINE) 1000 UNIT/ML IJ SOLN
INTRAMUSCULAR | Status: DC | PRN
  Administered 2022-02-20: 7000 [IU] via INTRAVENOUS
  Administered 2022-02-20: 4000 [IU] via INTRAVENOUS

## 2022-02-20 MED ORDER — PROPOFOL 10 MG/ML IV BOLUS
INTRAVENOUS | Status: AC
  Filled 2022-02-20: qty 20

## 2022-02-20 MED ORDER — PHENYLEPHRINE HCL-NACL 20-0.9 MG/250ML-% IV SOLN
INTRAVENOUS | Status: DC | PRN
  Administered 2022-02-20: 40 ug/min via INTRAVENOUS

## 2022-02-20 MED ORDER — PHENYLEPHRINE 80 MCG/ML (10ML) SYRINGE FOR IV PUSH (FOR BLOOD PRESSURE SUPPORT)
PREFILLED_SYRINGE | INTRAVENOUS | Status: DC | PRN
  Administered 2022-02-20 (×4): 80 ug via INTRAVENOUS

## 2022-02-20 MED ORDER — LACTATED RINGERS IV SOLN: INTRAVENOUS | Status: DC

## 2022-02-20 MED ORDER — SODIUM CHLORIDE 0.9 % IV SOLN: INTRAVENOUS | Status: DC

## 2022-02-20 MED ORDER — CHLORHEXIDINE GLUCONATE 0.12 % MT SOLN
OROMUCOSAL | Status: AC
  Administered 2022-02-20: 15 mL
  Filled 2022-02-20: qty 15

## 2022-02-20 MED ORDER — LABETALOL HCL 5 MG/ML IV SOLN
INTRAVENOUS | Status: DC | PRN
  Administered 2022-02-20: 5 mg via INTRAVENOUS

## 2022-02-20 MED ORDER — IRBESARTAN 300 MG PO TABS
300.0000 mg | ORAL_TABLET | Freq: Every day | ORAL | Status: DC
  Administered 2022-02-21: 300 mg via ORAL
  Filled 2022-02-20: qty 1

## 2022-02-20 MED ORDER — ROCURONIUM BROMIDE 10 MG/ML (PF) SYRINGE
PREFILLED_SYRINGE | INTRAVENOUS | Status: DC | PRN
  Administered 2022-02-20: 20 mg via INTRAVENOUS
  Administered 2022-02-20: 50 mg via INTRAVENOUS

## 2022-02-20 MED ORDER — FENTANYL CITRATE (PF) 250 MCG/5ML IJ SOLN
INTRAMUSCULAR | Status: AC
  Filled 2022-02-20: qty 5

## 2022-02-20 MED ORDER — CEFAZOLIN SODIUM-DEXTROSE 2-4 GM/100ML-% IV SOLN
2.0000 g | INTRAVENOUS | Status: AC
  Administered 2022-02-20: 2 g via INTRAVENOUS
  Filled 2022-02-20: qty 100

## 2022-02-20 MED ORDER — EPHEDRINE 5 MG/ML INJ
INTRAVENOUS | Status: AC
  Filled 2022-02-20: qty 5

## 2022-02-20 MED ORDER — PHENOL 1.4 % MT LIQD: 1.0000 | OROMUCOSAL | Status: DC | PRN

## 2022-02-20 MED ORDER — ACETAMINOPHEN 500 MG PO TABS
1000.0000 mg | ORAL_TABLET | Freq: Once | ORAL | Status: AC
  Administered 2022-02-20: 1000 mg via ORAL
  Filled 2022-02-20: qty 2

## 2022-02-20 MED ORDER — CHLORHEXIDINE GLUCONATE CLOTH 2 % EX PADS: 6.0000 | MEDICATED_PAD | Freq: Once | CUTANEOUS | Status: DC

## 2022-02-20 MED ORDER — POTASSIUM CHLORIDE CRYS ER 20 MEQ PO TBCR: 20.0000 meq | EXTENDED_RELEASE_TABLET | Freq: Every day | ORAL | Status: DC | PRN

## 2022-02-20 MED ORDER — MIDAZOLAM HCL 2 MG/2ML IJ SOLN
INTRAMUSCULAR | Status: DC | PRN
  Administered 2022-02-20: 2 mg via INTRAVENOUS

## 2022-02-20 MED ORDER — ALUM & MAG HYDROXIDE-SIMETH 200-200-20 MG/5ML PO SUSP: 15.0000 mL | ORAL | Status: DC | PRN

## 2022-02-20 SURGICAL SUPPLY — 60 items
BAG COUNTER SPONGE SURGICOUNT (BAG) ×2 IMPLANT
BAG ISOLATION DRAPE 18X18 (DRAPES) ×1 IMPLANT
BANDAGE ESMARK 6X9 LF (GAUZE/BANDAGES/DRESSINGS) IMPLANT
BNDG ESMARK 6X9 LF (GAUZE/BANDAGES/DRESSINGS)
CANISTER SUCT 3000ML PPV (MISCELLANEOUS) ×2 IMPLANT
CANNULA VESSEL 3MM 2 BLNT TIP (CANNULA) IMPLANT
CATH EMB 4FR 40CM (CATHETERS) ×1 IMPLANT
CLIP LIGATING EXTRA MED SLVR (CLIP) ×2 IMPLANT
CLIP LIGATING EXTRA SM BLUE (MISCELLANEOUS) ×2 IMPLANT
COVER PROBE CYLINDRICAL 5X96 (MISCELLANEOUS) ×1 IMPLANT
COVER PROBE W GEL 5X96 (DRAPES) IMPLANT
CUFF TOURN SGL QUICK 24 (TOURNIQUET CUFF)
CUFF TOURN SGL QUICK 34 (TOURNIQUET CUFF)
CUFF TOURN SGL QUICK 42 (TOURNIQUET CUFF) IMPLANT
CUFF TRNQT CYL 24X4X16.5-23 (TOURNIQUET CUFF) IMPLANT
CUFF TRNQT CYL 34X4.125X (TOURNIQUET CUFF) IMPLANT
DERMABOND ADVANCED .7 DNX12 (GAUZE/BANDAGES/DRESSINGS) ×3 IMPLANT
DRAIN CHANNEL 15F RND FF W/TCR (WOUND CARE) IMPLANT
DRAPE C-ARM 42X72 X-RAY (DRAPES) IMPLANT
DRAPE HALF SHEET 40X57 (DRAPES) ×2 IMPLANT
DRAPE ISOLATION BAG 18X18 (DRAPES) ×2
DRAPE TABLE BACK 80X90 (DRAPES) ×1 IMPLANT
ELECT REM PT RETURN 9FT ADLT (ELECTROSURGICAL) ×2
ELECTRODE REM PT RTRN 9FT ADLT (ELECTROSURGICAL) ×2 IMPLANT
EVACUATOR SILICONE 100CC (DRAIN) IMPLANT
GLOVE BIO SURGEON STRL SZ7.5 (GLOVE) ×1 IMPLANT
GLOVE BIOGEL PI IND STRL 6.5 (GLOVE) ×2 IMPLANT
GLOVE SURG SS PI 7.5 STRL IVOR (GLOVE) ×1 IMPLANT
GOWN STRL REUS W/ TWL LRG LVL3 (GOWN DISPOSABLE) ×4 IMPLANT
GOWN STRL REUS W/ TWL XL LVL3 (GOWN DISPOSABLE) ×2 IMPLANT
GOWN STRL REUS W/TWL LRG LVL3 (GOWN DISPOSABLE) ×4
GOWN STRL REUS W/TWL XL LVL3 (GOWN DISPOSABLE) ×2
HEMOSTAT SNOW SURGICEL 2X4 (HEMOSTASIS) IMPLANT
INSERT FOGARTY SM (MISCELLANEOUS) IMPLANT
KIT BASIN OR (CUSTOM PROCEDURE TRAY) ×2 IMPLANT
KIT TURNOVER KIT B (KITS) ×2 IMPLANT
MARKER GRAFT CORONARY BYPASS (MISCELLANEOUS) IMPLANT
NS IRRIG 1000ML POUR BTL (IV SOLUTION) ×4 IMPLANT
PACK PERIPHERAL VASCULAR (CUSTOM PROCEDURE TRAY) ×2 IMPLANT
PAD ARMBOARD 7.5X6 YLW CONV (MISCELLANEOUS) ×4 IMPLANT
POWDER SURGICEL 3.0 GRAM (HEMOSTASIS) ×1 IMPLANT
SET COLLECT BLD 21X3/4 12 (NEEDLE) IMPLANT
STOPCOCK 4 WAY LG BORE MALE ST (IV SETS) ×1 IMPLANT
SUT ETHILON 3 0 PS 1 (SUTURE) IMPLANT
SUT MNCRL AB 4-0 PS2 18 (SUTURE) ×4 IMPLANT
SUT PROLENE 5 0 C 1 24 (SUTURE) ×8 IMPLANT
SUT PROLENE 6 0 BV (SUTURE) ×4 IMPLANT
SUT SILK 2 0 SH (SUTURE) ×2 IMPLANT
SUT SILK 3 0 (SUTURE)
SUT SILK 3-0 18XBRD TIE 12 (SUTURE) IMPLANT
SUT VIC AB 2-0 CT1 27 (SUTURE) ×4
SUT VIC AB 2-0 CT1 TAPERPNT 27 (SUTURE) ×4 IMPLANT
SUT VIC AB 3-0 SH 27 (SUTURE) ×4
SUT VIC AB 3-0 SH 27X BRD (SUTURE) ×4 IMPLANT
SYR 3ML LL SCALE MARK (SYRINGE) ×1 IMPLANT
TOWEL GREEN STERILE (TOWEL DISPOSABLE) ×4 IMPLANT
TOWEL GREEN STERILE FF (TOWEL DISPOSABLE) ×2 IMPLANT
TRAY FOLEY MTR SLVR 16FR STAT (SET/KITS/TRAYS/PACK) ×2 IMPLANT
UNDERPAD 30X36 HEAVY ABSORB (UNDERPADS AND DIAPERS) ×2 IMPLANT
WATER STERILE IRR 1000ML POUR (IV SOLUTION) ×2 IMPLANT

## 2022-02-20 NOTE — Progress Notes (Signed)
  Day of Surgery Note    Subjective:  resting comfortably in recovery   Vitals:   02/20/22 1330 02/20/22 1345  BP: 138/82 131/87  Pulse: 70 62  Resp: 15 13  Temp: 97.8 F (36.6 C)   SpO2: 98% 92%    Incisions:   right groin is clean without hematoma Extremities:  brisk doppler flow right DP/PT Cardiac:  regular Lungs:  non labored   Assessment/Plan:  This is a 62 y.o. female who is s/p  Right iliofemoral endarterectomy with vein patch angioplasty  -pt with brisk doppler flow right foot; right groin looks fine without hematoma -to 4 east later today    [employer], PA-C 02/20/2022 1:52 PM [phone]

## 2022-02-20 NOTE — Anesthesia Postprocedure Evaluation (Signed)
Anesthesia Post Note  Patient: Brooke Chapman  Procedure(s) Performed: RIGHT COMMON FEMORAL ENDARTERECTOMY WITH VEIN PATCH (Right: Leg Upper)     Patient location during evaluation: PACU Anesthesia Type: General Level of consciousness: awake and alert Pain management: pain level controlled Vital Signs Assessment: post-procedure vital signs reviewed and stable Respiratory status: spontaneous breathing, nonlabored ventilation and respiratory function stable Cardiovascular status: stable and blood pressure returned to baseline Anesthetic complications: no   No notable events documented.  Last Vitals:  Vitals:   02/20/22 1345 02/20/22 1400  BP: 131/87 (!) 145/86  Pulse: 62 62  Resp: 13 16  Temp:    SpO2: 92% 96%               Audry Pili

## 2022-02-20 NOTE — Anesthesia Procedure Notes (Signed)
Procedure Name: Intubation Date/Time: 02/20/2022 10:45 AM  Performed by: Mosetta Pigeon, CRNAPre-anesthesia Checklist: Patient identified, Emergency Drugs available, Suction available and Patient being monitored Patient Re-evaluated:Patient Re-evaluated prior to induction Oxygen Delivery Method: Circle System Utilized Preoxygenation: Pre-oxygenation with 100% oxygen Induction Type: IV induction Ventilation: Mask ventilation without difficulty and Oral airway inserted - appropriate to patient size Laryngoscope Size: Mac and 3 Grade View: Grade I Tube type: Oral Tube size: 7.0 mm Number of attempts: 1 Airway Equipment and Method: Stylet and Oral airway Placement Confirmation: ETT inserted through vocal cords under direct vision, positive ETCO2 and breath sounds checked- equal and bilateral Secured at: 21 cm Tube secured with: Tape Dental Injury: Teeth and Oropharynx as per pre-operative assessment

## 2022-02-20 NOTE — Anesthesia Preprocedure Evaluation (Addendum)
Anesthesia Evaluation  Patient identified by MRN, date of birth, ID band Patient awake    Reviewed: Allergy & Precautions, NPO status , Patient's Chart, lab work & pertinent test results, reviewed documented beta blocker date and time   History of Anesthesia Complications Negative for: history of anesthetic complications  Airway Mallampati: II  TM Distance: >3 FB Neck ROM: Full    Dental  (+) Edentulous Upper, Edentulous Lower   Pulmonary Current Smoker and Patient abstained from smoking.   Pulmonary exam normal        Cardiovascular hypertension, Pt. on medications and Pt. on home beta blockers + CAD and + Peripheral Vascular Disease  Normal cardiovascular exam   '23 TTE - EF 55 %. There is mild asymmetric left ventricular hypertrophy of the basal-septal segment. Grade I diastolic dysfunction (impaired relaxation). Mild mitral valve regurgitation. Mild aortic valve stenosis. Aortic valve area, by VTI measures 1.20  cm. Aortic valve mean gradient measures 13.0 mmHg. Aortic valve Vmax measures 2.51 m/s.     Neuro/Psych  PSYCHIATRIC DISORDERS Anxiety     negative neurological ROS     GI/Hepatic negative GI ROS, Neg liver ROS,,,  Endo/Other   Pre-DM   Renal/GU CRFRenal disease     Musculoskeletal negative musculoskeletal ROS (+)    Abdominal   Peds  Hematology negative hematology ROS (+)   Anesthesia Other Findings Lupus   Reproductive/Obstetrics                             Anesthesia Physical Anesthesia Plan  ASA: 3  Anesthesia Plan: General   Post-op Pain Management: Tylenol PO (pre-op)*   Induction: Intravenous  PONV Risk Score and Plan: 2 and Treatment may vary due to age or medical condition, Ondansetron, Dexamethasone and Midazolam  Airway Management Planned: Oral ETT  Additional Equipment: Arterial line  Intra-op Plan:   Post-operative Plan: Extubation in  OR  Informed Consent: I have reviewed the patients History and Physical, chart, labs and discussed the procedure including the risks, benefits and alternatives for the proposed anesthesia with the patient or authorized representative who has indicated his/her understanding and acceptance.     Dental advisory given  Plan Discussed with: CRNA and Anesthesiologist  Anesthesia Plan Comments:        Anesthesia Quick Evaluation

## 2022-02-20 NOTE — Discharge Instructions (Signed)
° °[facility] ° °Discharge instructions ° °Lower Extremity Bypass Surgery ° °Please refer to the following instruction for your post-procedure care. Your surgeon or physician assistant will discuss any changes with you. ° °Activity ° °You are encouraged to walk as much as you can. You can slowly return to normal activities during the month after your surgery. Avoid strenuous activity and heavy lifting until your doctor tells you it's OK. Avoid activities such as vacuuming or swinging a golf club. Do not drive until your doctor give the OK and you are no longer taking prescription pain medications. It is also normal to have difficulty with sleep habits, eating and bowel movement after surgery. These will go away with time. ° °Bathing/Showering ° °Shower daily after you go home. Do not soak in a bathtub, hot tub, or swim until the incision heals completely. ° °Incision Care ° °Clean your incision with mild soap and water. Shower every day. Pat the area dry with a clean towel. You do not need a bandage unless otherwise instructed. Do not apply any ointments or creams to your incision. If you have open wounds you will be instructed how to care for them or a visiting nurse may be arranged for you. If you have staples or sutures along your incision they will be removed at your post-op appointment. You may have skin glue on your incision. Do not peel it off. It will come off on its own in about one week. ° °Wash the groin wound with soap and water daily and pat dry. (No tub bath-only shower)  Then put a dry gauze or washcloth in the groin to keep this area dry to help prevent wound infection.  Do this daily and as needed.  Do not use Vaseline or neosporin on your incisions.  Only use soap and water on your incisions and then protect and keep dry. ° °Diet ° °Resume your normal diet. There are no special food restrictions following this procedure. A low fat/ low cholesterol diet is  recommended for all patients with vascular disease. In order to heal from your surgery, it is CRITICAL to get adequate nutrition. Your body requires vitamins, minerals, and protein. Vegetables are the best source of vitamins and minerals. Vegetables also provide the perfect balance of protein. Processed food has little nutritional value, so try to avoid this. ° °Medications ° °Resume taking all your medications unless your doctor or physician assistant tells you not to. If your incision is causing pain, you may take over-the-counter pain relievers such as acetaminophen (Tylenol). If you were prescribed a stronger pain medication, please aware these medication can cause nausea and constipation. Prevent nausea by taking the medication with a snack or meal. Avoid constipation by drinking plenty of fluids and eating foods with high amount of fiber, such as fruits, vegetables, and grains. Take Colace 100 mg (an over-the-counter stool softener) twice a day as needed for constipation.  °Do not take Tylenol if you are taking prescription pain medications. ° °Follow Up ° °Our office will schedule a follow up appointment 2-3 weeks following discharge. ° °Please call us immediately for any of the following conditions ° °•Severe or worsening pain in your legs or feet while at rest or while walking •Increase pain, redness, warmth, or drainage (pus) from your incision site(s) °• Fever of 101 degree or higher °• The swelling in your leg with the bypass suddenly worsens and becomes more painful than when you were in the hospital °•   If you have been instructed to feel your graft pulse then you should do so every day. If you can no longer feel this pulse, call the office immediately. Not all patients are given this instruction. °•  °Leg swelling is common after leg bypass surgery. ° °The swelling should improve over a few months following surgery. To improve the swelling, you may elevate your legs above the level of your heart while  you are sitting or resting. Your surgeon or physician assistant may ask you to apply an ACE wrap or wear compression (TED) stockings to help to reduce swelling. ° °Reduce your risk of vascular disease ° °Stop smoking. If you would like help call [facility] at 1-800-QUIT-NOW (1-800-784-8669) or [place] at 336-586-4000. ° °• Manage your cholesterol °• Maintain a desired weight °• Control your diabetes weight °• Control your diabetes °• Keep your blood pressure down °•  °If you have any questions, please call the office at [phone] ° ° °

## 2022-02-20 NOTE — Transfer of Care (Signed)
Immediate Anesthesia Transfer of Care Note  Patient: Brooke Chapman  Procedure(s) Performed: RIGHT COMMON FEMORAL ENDARTERECTOMY WITH VEIN PATCH (Right: Leg Upper)  Patient Location: PACU  Anesthesia Type:General  Level of Consciousness: sedated  Airway & Oxygen Therapy: Patient Spontanous Breathing  Post-op Assessment: Report given to RN and Post -op Vital signs reviewed and stable  Post vital signs: Reviewed and stable  Last Vitals:  Vitals Value Taken Time  BP 138/82 02/20/22 1330  Temp    Pulse 76 02/20/22 1332  Resp 13 02/20/22 1332  SpO2 97 % 02/20/22 1332  Vitals shown include unvalidated device data.  Last Pain:  Vitals:   02/20/22 0905  TempSrc:   PainSc: 0-No pain         Complications: No notable events documented.

## 2022-02-20 NOTE — Op Note (Signed)
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 Donzetta Matters, MD Assistant: Leontine Locket, PA Procedure Performed: 1.  Harvest right greater saphenous vein 2.  Extensive right common femoral, profunda femoral and external iliac artery endarterectomy with vein patch angioplasty  Indications: 62 year old female with a history of a left common iliac artery stent.  She now has right greater than left lower extremity chronic limb threatening ischemia with rest pain and is indicated for common femoral endarterectomy with possible retrograde stenting and possible bypass.  She did not appear to have suitable vein by preoperative ultrasound for bypass we have discussed proceeding with PTFE versus common femoral endarterectomy only.  Findings: Common femoral artery on the right was subtotally occluded with heavily calcified plaque which extended high up in the right external iliac artery and no clamp could be placed and thus hemostasis was obtained with Fogarty balloon.  Extensive endarterectomy in the extrailiac artery was performed as well as down into the the medial circumflex and profunda branches until backbleeding was established and also the SFA although only minimal backbleeding was found there.  A vein patch angioplasty was placed.  At completion there were very strong signals in the profunda and the medial circumflex branch with biphasic flow in the SFA as expected with distal occlusion.  We did not perform bypass given the patient had a occluded common femoral artery and hopefully will have relief of her rest pain from endarterectomy alone and we did not place stents given very strong and completion.   Procedure:  The patient was identified in the holding area and taken to the operating where she is placed upon upper table  and sterilely prepped and draped in the bilateral groins and right lower extremity usual fashion, antibiotics were ministered a timeout was called.  Ultrasound was used to identify the saphenous vein which was suitable throughout the upper thigh but towards the knee became much more diminutive and this did not appear to be suitable for bypass.  I also identified the common femoral artery and the made a vertical incision and dissected down onto this and encircled the SFA as well as the profunda and the medial circumflex branch which was very large.  We also encircled multiple side branches and dissected up under the inguinal ligament and divided the crossing vein between ties.  1 side of the vein was injured which was repaired with 5-0 Prolene suture.  The extrailiac artery was heavily calcified I thought that no clamp will be able to be placed.  I did not want to initially harvest the vein in case it would require need for bypass and so I heparinized the patient and then clamped the outflow followed by the inflow.  I then minimally open to the common femoral artery until I could find a lumen cephalad and then passed a 4 Fogarty to 10 cm and inflated this.  Hemostasis was easily obtained.  I then fully open the common femoral artery extending up under the inguinal ligament and down onto the SFA.  Extensive endarterectomy was then performed and included repair of the lateral side and also repair of the medial circumflex branch after endarterectomy.  I performed extensive endarterectomy up under the inguinal ligament and the extrailiac artery and then allowed the balloon down and had very strong inflow.  Through the same incision I then harvested the  greater saphenous vein as far as I could and then made a second stab incision medially on the thigh to harvest with approximately 15 cm of saphenous vein.  This vein would not have been suitable for bypass but was great for a patch given that it was thickened.  I then  opened the vein longitudinally I did not reverse it given the size mismatch I then sewed in place as a patch with 5-0 Prolene suture.  Prior completion of flushing directions.  Upon completion I did need to place a few repair stitches.  There were very strong signals in the medial circumflex artery as well as the profunda femoris and a biphasic signal in the SFA consistent with distal occlusion.  25 mg of protamine was administered and hemostasis was obtained and the wounds and they were thoroughly irrigated.  We then closed in layers with Vicryl Monocryl and Dermabond was placed at the skin level.  Patient was awakened from anesthesia having tolerated procedure without any complication.  All counts were correct at completion.  An experienced assistant was necessary to assist in exposure of the common femoral as well as the external iliac and profunda and SFA branches.  The assistant also helped with endarterectomy and harvest of the great saphenous vein as well as patch angioplasty.  EBL: 200cc   Arneda Sappington C. Donzetta Matters, MD [facility] Office: [phone] Pager: [phone]

## 2022-02-20 NOTE — H&P (Signed)
HPI: This is a 61 y.o. female who is here today for follow up for PAD.  Pt has hx of angiogram with stent of the left CIA by Dr. Donzetta Matters on 07/13/2019.     Pt was last seen 08/26/2020 and at that time, she did have some calluses that were causing more pain than claudication.  She was not walking much throughout the day. She did not have any stenosis or occlusion of the left CIA stent.   The pt returns today for follow up.  She states that she has calluses on her feet that are painful and interfering with her daily lifestyle.  She had seen Dr. Jacqualyn Posey with podiatry and he would not operate on her calluses due to her ABI.  She states the right foot is worse and having pain in her left hip from compensating due to the pain.  She denies any claudication.  She does not really describe classic rest pain.  She continues to smoke but has been working on quitting.     The pt is on a statin for cholesterol management.    The pt is on an aspirin.    Other AC:  none The pt is on CCB, BB, ARB, HCTZ for hypertension.  The pt does not have diabetes. Tobacco hx:  current   Pt does not have family hx of AAA.       Past Medical History:  Diagnosis Date   Anemia     Antiplatelet or antithrombotic long-term use 01/13/2020   Black stools 01/13/2020   Essential hypertension     Frontal balding 12/28/2019   GAD (generalized anxiety disorder) 05/01/2020   HTN (hypertension) 05/01/2018   Hyperlipidemia     Hypertension     Iron deficiency anemia 12/28/2019   PAD (peripheral artery disease) (HCC)     Porokeratosis 08/26/2018   PVD (peripheral vascular disease) ([facility]) 08/26/2018   Skin lesion 05/26/2021   Tobacco dependence 05/01/2018   Unintended weight loss 01/13/2020           Past Surgical History:  Procedure Laterality Date   ABDOMINAL AORTOGRAM W/LOWER EXTREMITY Bilateral 07/13/2019    Procedure: ABDOMINAL AORTOGRAM W/LOWER EXTREMITY;  Surgeon: Waynetta Sandy, MD;  Location: [facility];   Service: Cardiovascular;  Laterality: Bilateral;   BREAST EXCISIONAL BIOPSY Left      LESS THAN 5 YEARS AGO   PERIPHERAL VASCULAR INTERVENTION Left 07/13/2019    Procedure: PERIPHERAL VASCULAR INTERVENTION;  Surgeon: Waynetta Sandy, MD;  Location: [facility];  Service: Cardiovascular;  Laterality: Left;  common iliac          Allergies  Allergen Reactions   Latex Rash               Current Facility-Administered Medications  Medication Dose Route Frequency Provider Last Rate Last Admin   0.9 %  sodium chloride infusion   Intravenous Continuous Waynetta Sandy, MD 100 mL/hr at 01/08/22 0606 New Bag at 01/08/22 0606           Family History  Problem Relation Age of Onset   Heart disease Mother     Hypertension Mother     Hyperlipidemia Mother     Hypertension Brother     Heart disease Maternal Aunt     Hypertension Maternal Aunt     Hyperlipidemia Maternal Aunt     Colon cancer Neg Hx     Liver cancer Neg Hx     Rectal cancer  Neg Hx     Stomach cancer Neg Hx        Social History         Socioeconomic History   Marital status: Single      Spouse name: Not on file   Number of children: 0   Years of education: Not on file   Highest education level: Not on file  Occupational History   Occupation: Prep cook  Tobacco Use   Smoking status: Every Day      Packs/day: 0.50      Types: Cigarettes   Smokeless tobacco: Never   Tobacco comments:      6-10 cigs daily  Vaping Use   Vaping Use: Never used  Substance and Sexual Activity   Alcohol use: Yes      Comment: 1 per day   Drug use: Never   Sexual activity: Not Currently  Other Topics Concern   Not on file  Social History Narrative   Not on file    Social Determinants of Health        Financial Resource Strain: Low Risk  (06/27/2021)    Overall Financial Resource Strain (CARDIA)     Difficulty of Paying Living Expenses: Not hard at all  Food Insecurity: Not on file  Transportation  Needs: Not on file  Physical Activity: Not on file  Stress: Not on file  Social Connections: Not on file  Intimate Partner Violence: Not on file        REVIEW OF SYSTEMS:    '[X]'$  denotes positive finding, '[ ]'$  denotes negative finding Cardiac   Comments:  Chest pain or chest pressure:      Shortness of breath upon exertion:      Short of breath when lying flat:      Irregular heart rhythm:             Vascular      Pain in calf, thigh, or hip brought on by ambulation:      Pain in feet at night that wakes you up from your sleep:       Blood clot in your veins:      Leg swelling:              Pulmonary      Oxygen at home:      Productive cough:       Wheezing:              Neurologic      Sudden weakness in arms or legs:       Sudden numbness in arms or legs:       Sudden onset of difficulty speaking or slurred speech:      Temporary loss of vision in one eye:       Problems with dizziness:              Gastrointestinal      Blood in stool:       Vomited blood:              Genitourinary      Burning when urinating:       Blood in urine:             Psychiatric      Major depression:              Hematologic      Bleeding problems:      Problems with blood clotting too easily:  Skin      Rashes or ulcers: x See HPI         Constitutional      Fever or chills:          PHYSICAL EXAMINATION:   Vitals:   02/20/22 0801 02/20/22 0905  BP: (!) 167/97   Pulse:  61  Resp:    Temp:        General:  WDWN in NAD; vital signs documented above Gait: Not observed HENT: WNL, normocephalic Pulmonary: normal non-labored breathing , without wheezing Cardiac: regular HR, without carotid bruits Abdomen: soft, NT; aortic pulse is not palpable Skin: without rashes Vascular Exam/Pulses:   Right Left  Radial 2+ (normal) 2+ (normal)  Femoral 2+ (normal) 2+ (normal)  Popliteal Unable to palpate Unable to palpate  DP Faint monophasic doppler signal  Monophasic doppler  PT Faint monophasic doppler Monophasic doppler  Peroneal Faint monophasic doppler Unable to obtain    Extremities: without ischemic changes, without Gangrene , without cellulitis; without open wounds; calluses present bilateral feet Right foot:    Left foot:  Musculoskeletal: no muscle wasting or atrophy       Neurologic: A&O X 3 Psychiatric:  The pt has Normal affect.     Non-Invasive Vascular Imaging:   ABI's/TBI's on 12/28/2021: Right:  0.63/0.34 - Great toe pressure: 50 Left:  0.45/0.35 - Great toe pressure: 52   Arterial duplex on 12/28/2021: Abdominal Aorta Findings:  +-----------+-------+----------+----------+---------+--------+--------+  Location  AP (cm)Trans (cm)PSV (cm/s)Waveform ThrombusComments  +-----------+-------+----------+----------+---------+--------+--------+  Proximal  2.37   2.48      48        triphasic                  +-----------+-------+----------+----------+---------+--------+--------+  Mid       1.62   1.89      33        triphasic                  +-----------+-------+----------+----------+---------+--------+--------+  Distal    1.51   1.51      51        triphasic                  +-----------+-------+----------+----------+---------+--------+--------+  RT CIA Prox0.8    0.7       106       triphasic                  +-----------+-------+----------+----------+---------+--------+--------+   Left Stent(s):  +---------------+--------+--------------+----------+---------+  Left CIA       PSV cm/sStenosis      Waveform  Comments   +---------------+--------+--------------+----------+---------+  Prox to Stent  51                    triphasic Distal Ao  +---------------+--------+--------------+----------+---------+  Proximal Stent 61                    triphasic            +---------------+--------+--------------+----------+---------+  Mid Stent      112     1-49%  stenosistriphasic            +---------------+--------+--------------+----------+---------+  Distal Stent   63                    monophasic           +---------------+--------+--------------+----------+---------+  Distal to Stent90  monophasic           +---------------+--------+--------------+----------+---------+    Previous ABI's/TBI's on 08/26/2020: Right:  0.58/0.39 - Great toe pressure: 64 Left:  0.60/0.45 - Great toe pressure:  74   Previous arterial duplex on 08/26/2020: Summary:  Abdominal Aorta: Aorta/ Iliac: no evidence of stenosis or occlusion.  Patent Left CIA stent.        ASSESSMENT/PLAN:: 62 y.o. female here for follow up for PAD with hx of left CIA stent now with rest pain R > L foot. Plan for right common femoral endarterectomy with possible retrograde stent and possible bypass today in OR.  I reviewed her saphenous vein mapping which demonstrates very diminutive saphenous veins and I discussed with her that bypassing PTFE to her very diminutive popliteal or anterior tibial artery would have long-term patency.  Given the appearance of the common iliac artery and common femoral artery on recent angiography I discussed common femoral endarterectomy with possible retrograde stenting to improve her inflow and possibly improve her symptoms.  She demonstrates good understanding and if she does not have suitable saphenous vein with on table ultrasound we will likely forego bypass and use the vein as a patch angioplasty of the common femoral artery.  Daymion Nazaire C. Donzetta Matters, MD [facility] Office: [phone] Pager: [phone]

## 2022-02-21 ENCOUNTER — Encounter (HOSPITAL_COMMUNITY): Payer: Self-pay | Admitting: Vascular Surgery

## 2022-02-21 LAB — CBC
HCT: 33.1 % — ABNORMAL LOW (ref 36.0–46.0)
Hemoglobin: 11.5 g/dL — ABNORMAL LOW (ref 12.0–15.0)
MCH: 33.5 pg (ref 26.0–34.0)
MCHC: 34.7 g/dL (ref 30.0–36.0)
MCV: 96.5 fL (ref 80.0–100.0)
Platelets: 170 10*3/uL (ref 150–400)
RBC: 3.43 MIL/uL — ABNORMAL LOW (ref 3.87–5.11)
RDW: 13.2 % (ref 11.5–15.5)
WBC: 7.7 10*3/uL (ref 4.0–10.5)
nRBC: 0 % (ref 0.0–0.2)

## 2022-02-21 LAB — BASIC METABOLIC PANEL
Anion gap: 8 (ref 5–15)
BUN: 14 mg/dL (ref 8–23)
CO2: 23 mmol/L (ref 22–32)
Calcium: 8.4 mg/dL — ABNORMAL LOW (ref 8.9–10.3)
Chloride: 103 mmol/L (ref 98–111)
Creatinine, Ser: 1.2 mg/dL — ABNORMAL HIGH (ref 0.44–1.00)
GFR, Estimated: 51 mL/min — ABNORMAL LOW (ref >60)
Glucose, Bld: 95 mg/dL (ref 70–99)
Potassium: 3.8 mmol/L (ref 3.5–5.1)
Sodium: 134 mmol/L — ABNORMAL LOW (ref 135–145)

## 2022-02-21 LAB — LIPID PANEL
Cholesterol: 144 mg/dL (ref 0–200)
HDL: 41 mg/dL (ref >40)
LDL Cholesterol: 87 mg/dL (ref 0–99)
Total CHOL/HDL Ratio: 3.5 RATIO
Triglycerides: 81 mg/dL (ref <150)
VLDL: 16 mg/dL (ref 0–40)

## 2022-02-21 MED ORDER — OXYCODONE-ACETAMINOPHEN 5-325 MG PO TABS: 1.0000 | ORAL_TABLET | Freq: Four times a day (QID) | ORAL | 0 refills | Status: DC | PRN

## 2022-02-21 NOTE — Evaluation (Signed)
Occupational Therapy Evaluation and Discharge Patient Details Name: Brooke Chapman MRN: [id number] DOB: April 03, 1960 Today's Date: 02/21/2022   History of Present Illness Pt is a 62 year old woman admitted on 02/20/22 for R common femoral endarterectomy with vein patch. PMH: PVD, L CIA, HTN, HLD, CAD, anxiety, Lupus, tobacco abuse.   Clinical Impression   Pt is typically independent. Mild balance deficits in standing requiring supervision for safety. Pt hovering over toilet due to pain, educated in use of 3 in 1 to elevate and as shower seat as needed. Pt needs up to min assist for LB ADLs. Will relay on her "old man" when she returns home until pain subsides. Pt is eager to go home. No further OT needs.       Recommendations for follow up therapy are one component of a multi-disciplinary discharge planning process, led by the attending physician.  Recommendations may be updated based on patient status, additional functional criteria and insurance authorization.   Follow Up Recommendations  No OT follow up     Assistance Recommended at Discharge Intermittent Supervision/Assistance  Patient can return home with the following A little help with walking and/or transfers    Functional Status Assessment  Patient has had a recent decline in their functional status and demonstrates the ability to make significant improvements in function in a reasonable and predictable amount of time.  Equipment Recommendations  BSC/3in1    Recommendations for Other Services       Precautions / Restrictions Precautions Precautions: Fall Precaution Comments: minimal fall risk      Mobility Bed Mobility Overal bed mobility: Independent                  Transfers Overall transfer level: Modified independent Equipment used: None                      Balance Overall balance assessment: Mild deficits observed, not formally tested                                          ADL either performed or assessed with clinical judgement   ADL Overall ADL's : Needs assistance/impaired Eating/Feeding: Independent   Grooming: Wash/dry hands;Standing;Supervision/safety   Upper Body Bathing: Set up;Sitting   Lower Body Bathing: Minimal assistance;Sit to/from stand   Upper Body Dressing : Set up;Sitting   Lower Body Dressing: Minimal assistance;Sit to/from stand   Toilet Transfer: Copy Details (indicate cue type and reason): educated in use of 3 in 1 over toilet, pt is currently hovering due to pain Toileting- Clothing Manipulation and Hygiene: Supervision/safety       Functional mobility during ADLs: Supervision/safety General ADL Comments: Pt wil rely on s/o for R foot bathing and dressing until pain subsides.     Vision Baseline Vision/History: 1 Wears glasses Ability to See in Adequate Light: 0 Adequate Patient Visual Report: No change from baseline       Perception     Praxis      Pertinent Vitals/Pain Pain Assessment Pain Assessment: Faces Faces Pain Scale: Hurts little more Pain Location: R groin Pain Descriptors / Indicators: Sore Pain Intervention(s): Monitored during session     Hand Dominance Right   Extremity/Trunk Assessment Upper Extremity Assessment Upper Extremity Assessment: Overall WFL for tasks assessed   Lower Extremity Assessment Lower Extremity Assessment: Defer to PT evaluation   Cervical /  Trunk Assessment Cervical / Trunk Assessment: Normal   Communication Communication Communication: No difficulties   Cognition Arousal/Alertness: Awake/alert Behavior During Therapy: WFL for tasks assessed/performed Overall Cognitive Status: Within Functional Limits for tasks assessed                                       General Comments       Exercises     Shoulder Instructions      Home Living Family/patient expects to be discharged to:: Private residence Living  Arrangements: Spouse/significant other Available Help at Discharge: Family;Available 24 hours/day (through the weekend) Type of Home: House Home Access: Stairs to enter [employer] of Steps: 3 Entrance Stairs-Rails: Left Home Layout: One level     Bathroom Shower/Tub: Teacher, early years/pre: Standard     Home Equipment: None          Prior Functioning/Environment Prior Level of Function : Independent/Modified Independent;Driving                        OT Problem List:        OT Treatment/Interventions:      OT Goals(Current goals can be found in the care plan section)    OT Frequency:      Co-evaluation              AM-PAC OT "6 Clicks" Daily Activity     Outcome Measure Help from another person eating meals?: None Help from another person taking care of personal grooming?: None Help from another person toileting, which includes using toliet, bedpan, or urinal?: None Help from another person bathing (including washing, rinsing, drying)?: A Little Help from another person to put on and taking off regular upper body clothing?: None Help from another person to put on and taking off regular lower body clothing?: A Little 6 Click Score: 22   End of Session    Activity Tolerance: Patient tolerated treatment well Patient left: Other (comment) (walking with PT)  OT Visit Diagnosis: Unsteadiness on feet (R26.81)                Time: 3086-5784 OT Time Calculation (min): 14 min Charges:  OT General Charges $OT Visit: 1 Visit OT Evaluation $OT Eval Low Complexity: [place], OTR/L Acute Rehabilitation Services Office: [phone]   Malka So 02/21/2022, 9:59 AM

## 2022-02-21 NOTE — Progress Notes (Addendum)
  Progress Note    02/21/2022 7:47 AM 1 Day Post-Op  Subjective:  no complaints. Says she tolerated walking several times yesterday afternoon    Vitals:   02/20/22 2300 02/21/22 0340  BP: 120/83 (!) 151/99  Pulse: 65 63  Resp: 13 16  Temp: 98.4 F (36.9 C) 98.4 F (36.9 C)  SpO2: 98% 99%   Physical Exam: Cardiac:  regular Lungs:  non labored Incisions:  right groin incision is c/di without swelling or hematoma. Saphenectomy site c/d/i Extremities:  well perfused and warm with doppler Dp/PT bilaterally Abdomen:  soft Neurologic: alert and oriented  CBC    Component Value Date/Time   WBC 7.7 02/21/2022 0555   RBC 3.43 (L) 02/21/2022 0555   HGB 11.5 (L) 02/21/2022 0555   HGB 13.8 02/08/2022 1211   HCT 33.1 (L) 02/21/2022 0555   HCT 40.6 02/08/2022 1211   PLT 170 02/21/2022 0555   PLT 209 02/08/2022 1211   MCV 96.5 02/21/2022 0555   MCV 95 02/08/2022 1211   MCH 33.5 02/21/2022 0555   MCHC 34.7 02/21/2022 0555   RDW 13.2 02/21/2022 0555   RDW 13.0 02/08/2022 1211   LYMPHSABS 2.3 03/02/2021 1523   EOSABS 0.1 03/02/2021 1523   BASOSABS 0.0 03/02/2021 1523    BMET    Component Value Date/Time   NA 134 (L) 02/21/2022 0555   NA 136 02/08/2022 1211   K 3.8 02/21/2022 0555   CL 103 02/21/2022 0555   CO2 23 02/21/2022 0555   GLUCOSE 95 02/21/2022 0555   BUN 14 02/21/2022 0555   BUN 19 02/08/2022 1211   CREATININE 1.20 (H) 02/21/2022 0555   CALCIUM 8.4 (L) 02/21/2022 0555   GFRNONAA 51 (L) 02/21/2022 0555   GFRAA 79 05/11/2019 1534    INR    Component Value Date/Time   INR 1.1 02/16/2022 1321     Intake/Output Summary (Last 24 hours) at 02/21/2022 0747 Last data filed at 02/21/2022 0600 Gross per 24 hour  Intake 2740 ml  Output 1100 ml  Net 1640 ml     Assessment/Plan:  62 y.o. female is s/p harvest of right greater saphenous vein, extensive right common femoral, profunda femoral and external iliac artery endarterectomy with vein patch angioplasty 1  Day Post-Op   Right groin incision intact. No swelling or hematoma. Saphenectomy site c/d/I Right leg well perfused and warm with Doppler Dp/ PT signals Pain well controlled Has tolerated ambulating in hall without difficulty Has not yet voided since foley removed Mobilize this morning Anticipate d/c later this afternoon Continue Aspirin and Statin She will have follow up arranged in 2-3 weeks for incision check  DVT prophylaxis:  sq heparin   Karoline Caldwell, PA-C [facility] [phone] 02/21/2022 7:47 AM  I have independently interviewed and examined patient and agree with PA assessment and plan above. Expected postoperative pain in right groin with very warm right foot with strong DP and PT signals.  She has ambulated and is okay for discharge on aspirin and statin.  Mayukha Symmonds C. Donzetta Matters, MD [facility] Office: [phone] Pager: [phone]

## 2022-02-21 NOTE — Progress Notes (Addendum)
Patient given discharge instructions. PIVs removed. Telemetry box removed, CCMD notified. Patient taken to vehicle in wheelchair by staff.  Daymon Larsen, RN

## 2022-02-21 NOTE — Evaluation (Signed)
Physical Therapy Evaluation Patient Details Name: Brooke Chapman MRN: [id number] DOB: 01-09-1961 Today's Date: 02/21/2022  History of Present Illness  Pt is a 62 year old woman admitted on 02/20/22 for R common femoral endarterectomy with vein patch. PMH: PVD, L CIA, HTN, HLD, CAD, anxiety, Lupus, tobacco abuse.   Clinical Impression  Pt presents with condition above and deficits mentioned below, see PT Problem List. PTA, she was independent without DME, living with her significant other in a 1-level house with 3 STE. Pt is demonstrating some mild deficits in activity tolerance and R lower extremity weight acceptance when ambulating due to R groin pain. In addition, she is displaying some mild balance deficits, intermittently swaying and displaying x1 LOB posteriorly in which pt was able to recover on her own. Supervision was provided for safety with gait and stair training. Pt will likely progress quickly as her pain improves, thus no PT follow-up likely needed. Will continue to follow acutely to maximize pt's return to baseline prior to d/c home.       Recommendations for follow up therapy are one component of a multi-disciplinary discharge planning process, led by the attending physician.  Recommendations may be updated based on patient status, additional functional criteria and insurance authorization.  Follow Up Recommendations No PT follow up      Assistance Recommended at Discharge PRN  Patient can return home with the following  A little help with walking and/or transfers;Help with stairs or ramp for entrance;Assist for transportation;Assistance with cooking/housework    Equipment Recommendations None recommended by PT  Recommendations for Other Services       Functional Status Assessment Patient has had a recent decline in their functional status and demonstrates the ability to make significant improvements in function in a reasonable and predictable amount of time.      Precautions / Restrictions Precautions Precautions: Fall Precaution Comments: minimal fall risk Restrictions Weight Bearing Restrictions: No      Mobility  Bed Mobility Overal bed mobility: Modified Independent             General bed mobility comments: Able to return to supine with HOB elevated without assistance.    Transfers                   General transfer comment: Pt standing in room with OT upon arrival. Able to sit down without assistance    Ambulation/Gait Ambulation/Gait assistance: Supervision Gait Distance (Feet): 230 Feet Assistive device: None Gait Pattern/deviations: Step-through pattern, Decreased stride length, Drifts right/left Gait velocity: reduced Gait velocity interpretation: 1.31 - 2.62 ft/sec, indicative of limited community ambulator   General Gait Details: Pt with intermittent trunk sway and x1 LOB posteriorly when standing statically, but able to recover on her own with reactional strategies. Supervision for safety  Stairs Stairs: Yes Stairs assistance: Supervision Stair Management: One rail Left, Step to pattern, Forwards Number of Stairs: 4 General stair comments: Educated pt on leading up with her L leg and down with her R to reduce pain, but pt alternates which leg she leads with. No LOB, using L handrail for support, supervision for safety  Wheelchair Mobility    Modified Rankin (Stroke Patients Only)       Balance Overall balance assessment: Mild deficits observed, not formally tested  Pertinent Vitals/Pain Pain Assessment Pain Assessment: Faces Faces Pain Scale: Hurts little more Pain Location: R groin Pain Descriptors / Indicators: Sore Pain Intervention(s): Limited activity within patient's tolerance, Monitored during session    Home Living Family/patient expects to be discharged to:: Private residence Living Arrangements: Spouse/significant  other Available Help at Discharge: Family;Available 24 hours/day (through the weekend) Type of Home: House Home Access: Stairs to enter Entrance Stairs-Rails: Left Entrance Stairs-Number of Steps: 3   Home Layout: One level Home Equipment: None      Prior Function Prior Level of Function : Independent/Modified Independent;Driving                     Hand Dominance   Dominant Hand: Right    Extremity/Trunk Assessment   Upper Extremity Assessment Upper Extremity Assessment: Defer to OT evaluation    Lower Extremity Assessment Lower Extremity Assessment: RLE deficits/detail RLE Deficits / Details: pain limiting weight bearing acceptance and AROM/strength mildly, but overall WFL    Cervical / Trunk Assessment Cervical / Trunk Assessment: Normal  Communication   Communication: No difficulties  Cognition Arousal/Alertness: Awake/alert Behavior During Therapy: WFL for tasks assessed/performed Overall Cognitive Status: Within Functional Limits for tasks assessed                                          General Comments      Exercises     Assessment/Plan    PT Assessment Patient needs continued PT services  PT Problem List Decreased balance;Decreased mobility;Decreased activity tolerance;Pain       PT Treatment Interventions DME instruction;Gait training;Stair training;Functional mobility training;Therapeutic activities;Therapeutic exercise;Neuromuscular re-education;Balance training;Patient/family education    PT Goals (Current goals can be found in the Care Plan section)  Acute Rehab PT Goals Patient Stated Goal: to rest after therapy PT Goal Formulation: With patient Time For Goal Achievement: 03/07/22 Potential to Achieve Goals: Good    Frequency Min 2X/week     Co-evaluation               AM-PAC PT "6 Clicks" Mobility  Outcome Measure Help needed turning from your back to your side while in a flat bed without using  bedrails?: None Help needed moving from lying on your back to sitting on the side of a flat bed without using bedrails?: None Help needed moving to and from a bed to a chair (including a wheelchair)?: None Help needed standing up from a chair using your arms (e.g., wheelchair or bedside chair)?: None Help needed to walk in hospital room?: A Little Help needed climbing 3-5 steps with a railing? : A Little 6 Click Score: 22    End of Session   Activity Tolerance: Patient tolerated treatment well Patient left: in bed;with call bell/phone within reach   PT Visit Diagnosis: Unsteadiness on feet (R26.81);Other abnormalities of gait and mobility (R26.89);Pain Pain - Right/Left: Right Pain - part of body: Hip    Time: 9242-6834 PT Time Calculation (min) (ACUTE ONLY): 10 min   Charges:   PT Evaluation $PT Eval Low Complexity: 1 Low          Moishe Spice, PT, DPT Acute Rehabilitation Services  Office: [phone]   Orvan Falconer 02/21/2022, 11:51 AM

## 2022-02-22 ENCOUNTER — Telehealth: Payer: Self-pay

## 2022-02-22 NOTE — Telephone Encounter (Signed)
Transition Care Management Follow-up Telephone Call Date of discharge and from where: 02/21/2022, [facility]  How have you been since you were released from the hospital? She stated she is feeling pretty good. She has not removed the dressing yet.  Any questions or concerns? No  Items Reviewed: Did the pt receive and understand the discharge instructions provided? Yes  Medications obtained and verified? Yes - she said she has all of her medications and did not have any questions about the med regime.  Other? No  Any new allergies since your discharge? No  Dietary orders reviewed? Yes Do you have support at home? Yes   Home Care and Equipment/Supplies: Were home health services ordered? no If so, what is the name of the agency? N/a  Has the agency set up a time to come to the patient's home? not applicable Were any new equipment or medical supplies ordered?  No What is the name of the medical supply agency? N/a Were you able to get the supplies/equipment? not applicable Do you have any questions related to the use of the equipment or supplies? No  Functional Questionnaire: (I = Independent and D = Dependent) ADLs: independent  Follow up appointments reviewed:  PCP Hospital f/u appt confirmed?  She does not see Dr Wynetta Emery until 06/14/2022 and she did not feel that she needs to be seen any sooner.    [facility] f/u appt confirmed?  She has not heard from [facility] yet. Per AVS, the office will call her to schedule an appointment  Are transportation arrangements needed? No  If their condition worsens, is the pt aware to call PCP or go to the Emergency Dept.? Yes Was the patient provided with contact information for the PCP's office or ED? Yes Was to pt encouraged to call back with questions or concerns? Yes

## 2022-02-22 NOTE — Discharge Summary (Signed)
Bypass Discharge Summary Patient ID: Brooke Chapman [id number] 61 y.o. November 26, 1960  Admit date: 02/20/2022  Discharge date and time: 02/21/2022  1:38 PM   Admitting Physician: Waynetta Sandy, MD   Discharge Physician: Servando Snare, MD  Admission Diagnoses: PAD (peripheral artery disease) [facility]) [I73.9]  Discharge Diagnoses: PAD (peripheral artery disease) ([place]) [I73.9]  Admission Condition: fair  Discharged Condition: good  Indication for Admission: 62 year old female with a history of a left common iliac artery stent. She now has right greater than left lower extremity chronic limb threatening ischemia with rest pain and is indicated for common femoral endarterectomy with possible retrograde stenting and possible bypass. She did not appear to have suitable vein by preoperative ultrasound for bypass we have discussed proceeding with PTFE versus common femoral endarterectomy only.   Hospital Course: Brooke Chapman was admitted on 02/20/2022 and underwent harvesting of her right greater saphenous vein, and extensive right common femoral, profunda femoral and external iliac artery endarterectomy with vein patch angioplasty by Dr. Donzetta Matters. She tolerated the surgery well and was taken to the recovery room in stable condition.   By POD#1, pain remained well controlled. Right leg well perfused and warm with doppler DP and PT signals. Right groin incision remained intact and well appearing as well as saphenectomy site. Tolerated ambulating. Voiding without difficulty. Tolerating diet. Hemodynamically stable. She remained stable for discharge home. She will resume home medications as prescribed. She will continue on Aspirin and Statin. PDMP was reviewed and acute post operative pain medication sent to patients pharmacy. She has follow up arranged in 2-3 weeks for incision check.   Consults: None  Treatments: antibiotics: Ancef, analgesia: acetaminophen, Dilaudid, and Aspirin, anticoagulation: sq  heparin, therapies: PT and OT, and surgery:  harvest of right greater saphenous vein, extensive right common femoral, profunda femoral and external iliac artery endarterectomy with vein patch angioplasty   Disposition: Discharge disposition: 01-Home or Self Care       - For VQI Registry use ---  Post-op:  Wound infection: No  Graft infection: No  Transfusion: No  If yes, 0 units given New Arrhythmia: No Patency judged by: [ X] Dopper only, '[ ]'$  Palpable graft pulse, '[ ]'$  Palpable distal pulse, '[ ]'$  ABI inc. > 0.15, '[ ]'$  Duplex D/C Ambulatory Status: Ambulatory  Complications: MI: [ X] No, '[ ]'$  Troponin only, '[ ]'$  EKG or Clinical CHF: No Resp failure: [ X] none, '[ ]'$  Pneumonia, '[ ]'$  Ventilator Chg in renal function: [ X] none, '[ ]'$  Inc. Cr > 0.5, '[ ]'$  Temp. Dialysis, '[ ]'$  Permanent dialysis Stroke: [ X] None, '[ ]'$  Minor, '[ ]'$  Major Return to OR: No  Reason for return to OR: '[ ]'$  Bleeding, '[ ]'$  Infection, '[ ]'$  Thrombosis, '[ ]'$  Revision  Discharge medications: Statin use:  Yes ASA use:  Yes Plavix use:  No Beta blocker use: Yes Coumadin use: No    Patient Instructions:  Allergies as of 02/21/2022       Reactions   Latex Rash        Medication List     TAKE these medications    amLODipine 10 MG tablet Commonly known as: NORVASC Take 1 tablet (10 mg total) by mouth daily.   aspirin 81 MG chewable tablet Chew 1 tablet (81 mg total) by mouth daily.   atorvastatin 20 MG tablet Commonly known as: LIPITOR Take 1.5 tablets (30 mg total) by mouth daily.   Blood Pressure Monitor Devi Use as directed to check home  blood pressure 2-3 times a week   carvedilol 25 MG tablet Commonly known as: COREG Take 1 tablet (25 mg total) by mouth 2 (two) times daily.   cholecalciferol 25 MCG (1000 UNIT) tablet Commonly known as: VITAMIN D3 Take 1,000 Units by mouth daily.   cilostazol 100 MG tablet Commonly known as: PLETAL Take 1 tablet (100 mg total) by mouth 2 (two) times daily.    ferrous sulfate 325 (65 FE) MG tablet Take 1 tablet (325 mg total) by mouth 2 (two) times daily with a meal. What changed: when to take this   nitroGLYCERIN 0.4 MG SL tablet Commonly known as: NITROSTAT Place 1 tablet (0.4 mg total) under the tongue every 5 (five) minutes as needed.   oxyCODONE-acetaminophen 5-325 MG tablet Commonly known as: Percocet Take 1 tablet by mouth every 6 (six) hours as needed for severe pain.   potassium chloride 8 MEQ tablet Commonly known as: KLOR-CON Take 2 tablets (16 mEq total) by mouth daily.   valsartan-hydrochlorothiazide 320-25 MG tablet Commonly known as: DIOVAN-HCT Take 1 tablet by mouth daily.               Discharge Care Instructions  (From admission, onward)           Start     Ordered   02/21/22 0000  Discharge wound care:       Comments: Keep dry for 24 hours. You can then shower and wash with mild soap and water, pat dry. Do not soak in bathtub   02/21/22 1251           Activity: activity as tolerated, no driving while on analgesics, and no heavy lifting for 4 weeks Diet: cardiac diet and low fat, low cholesterol diet Wound Care:  wash incisions with mild soap and water, pat dry. Do not soak in bathtub  Follow-up with [facility] in  2-3   weeks.  Signed: Karoline Caldwell PA-C 02/22/2022 2:05 PM

## 2022-03-14 ENCOUNTER — Ambulatory Visit (INDEPENDENT_AMBULATORY_CARE_PROVIDER_SITE_OTHER): Payer: Medicaid Other | Admitting: Physician Assistant

## 2022-03-14 ENCOUNTER — Encounter: Payer: Self-pay | Admitting: Vascular Surgery

## 2022-03-14 VITALS — BP 143/86 | HR 83 | Temp 97.7°F | Ht 64.0 in | Wt 138.0 lb

## 2022-03-14 DIAGNOSIS — I739 Peripheral vascular disease, unspecified: Secondary | ICD-10-CM

## 2022-03-14 NOTE — Progress Notes (Signed)
  POST OPERATIVE OFFICE NOTE    CC:  F/u for surgery  HPI:  This is a 62 y.o. female who is s/p right iliofemoral endarterectomy with vein patch angioplasty by Dr. Donzetta Matters on 02/20/2022 due to ischemic rest pain.  Patient states the rest pain has completely resolved since surgery.  She also believes her incision is well-healed.  Since surgery she has noticed left calf claudication and occasional rest pain in her left foot at night.  She is taking aspirin and statin daily.  She has not smoked a cigarette since surgery.  Allergies  Allergen Reactions   Latex Rash    Current Outpatient Medications  Medication Sig Dispense Refill   amLODipine (NORVASC) 10 MG tablet Take 1 tablet (10 mg total) by mouth daily. 30 tablet 3   aspirin 81 MG chewable tablet Chew 1 tablet (81 mg total) by mouth daily. 100 tablet 1   atorvastatin (LIPITOR) 20 MG tablet Take 1.5 tablets (30 mg total) by mouth daily. 135 tablet 3   Blood Pressure Monitor DEVI Use as directed to check home blood pressure 2-3 times a week 1 Device 0   carvedilol (COREG) 25 MG tablet Take 1 tablet (25 mg total) by mouth 2 (two) times daily. 180 tablet 3   cholecalciferol (VITAMIN D3) 25 MCG (1000 UNIT) tablet Take 1,000 Units by mouth daily.     cilostazol (PLETAL) 100 MG tablet Take 1 tablet (100 mg total) by mouth 2 (two) times daily. 180 tablet 3   ferrous sulfate 325 (65 FE) MG tablet Take 1 tablet (325 mg total) by mouth 2 (two) times daily with a meal. (Patient taking differently: Take 325 mg by mouth once a week.) 100 tablet 1   potassium chloride (KLOR-CON) 8 MEQ tablet Take 2 tablets (16 mEq total) by mouth daily. 180 tablet 1   valsartan-hydrochlorothiazide (DIOVAN-HCT) 320-25 MG tablet Take 1 tablet by mouth daily.     nitroGLYCERIN (NITROSTAT) 0.4 MG SL tablet Place 1 tablet (0.4 mg total) under the tongue every 5 (five) minutes as needed. 25 tablet 6   Current Facility-Administered Medications  Medication Dose Route Frequency  Provider Last Rate Last Admin   cloNIDine (CATAPRES) tablet 0.1 mg  0.1 mg Oral Once Freeman Caldron M, PA-C         ROS:  See HPI  Physical Exam:  Vitals:   03/14/22 1325  BP: (!) 143/86  Pulse: 83  Temp: 97.7 F (36.5 C)  TempSrc: Temporal  SpO2: 99%  Weight: 138 lb (62.6 kg)  Height: '5\' 4"'$  (1.626 m)    Incision:  R groin well healed Extremities:  palpable R DP 1+ Neuro: A&O Abdomen:  A&O  Assessment/Plan:  This is a 62 y.o. female who is s/p: Right iliofemoral endarterectomy with vein patch angioplasty due to ischemic rest pain  -Rest pain has completely resolved since surgery -Right groin incision well-healed -Left leg claudication and occasional ischemic rest pain in the left foot has become more apparent to the patient since surgery -Patient will follow-up in 4 to 6 weeks with an ABI to discuss treatment options for left lower extremity with Dr. Rolanda Lundborg, PA-C [facility] [phone]  Clinic MD:  Donzetta Matters

## 2022-03-16 ENCOUNTER — Other Ambulatory Visit: Payer: Self-pay

## 2022-03-16 DIAGNOSIS — I739 Peripheral vascular disease, unspecified: Secondary | ICD-10-CM

## 2022-03-17 IMAGING — CT CT CHEST LUNG CANCER SCREENING LOW DOSE W/O CM
1 of 2 series · 10 of 20 positions shown, 13 images · non-contrast
Comparison: None.

CLINICAL DATA: Thirty pack-year smoking history.  Current smoker.

EXAM:
CT CHEST WITHOUT CONTRAST LOW-DOSE FOR LUNG CANCER SCREENING
TECHNIQUE: Multidetector CT imaging of the chest was performed following the
standard protocol without IV contrast.

[ct lung segmentation data · axial · 0.78mm/px · z∈[-109,-109]mm · 10 of 273 frames shown]
[frame 1/273  mediastinal]
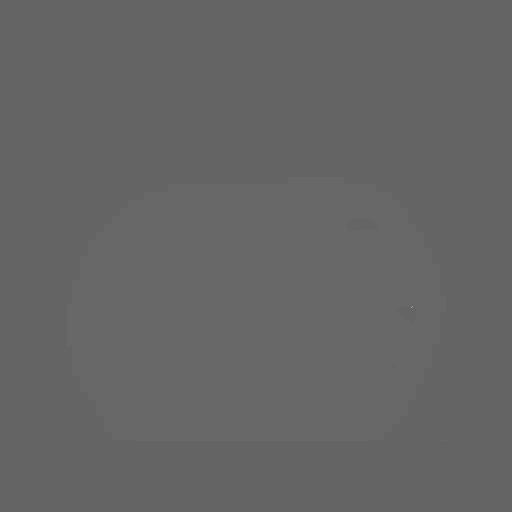
[frame 1/273  lung]
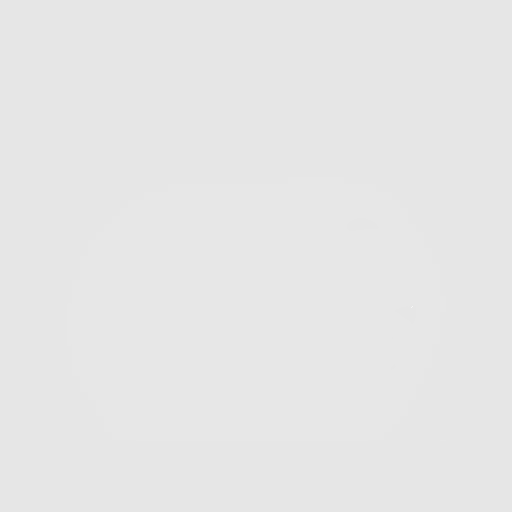
[frame 31/273  lung]
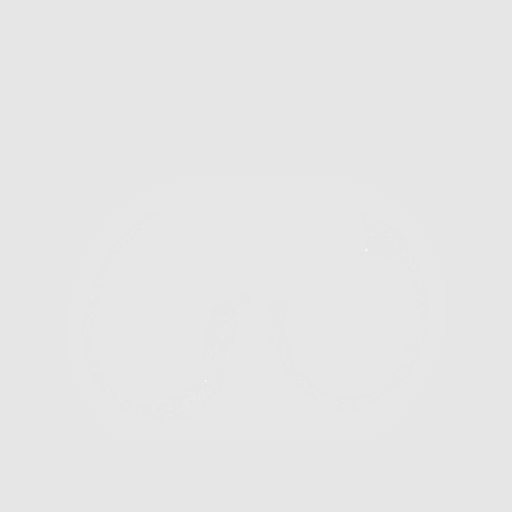
[frame 61/273  lung]
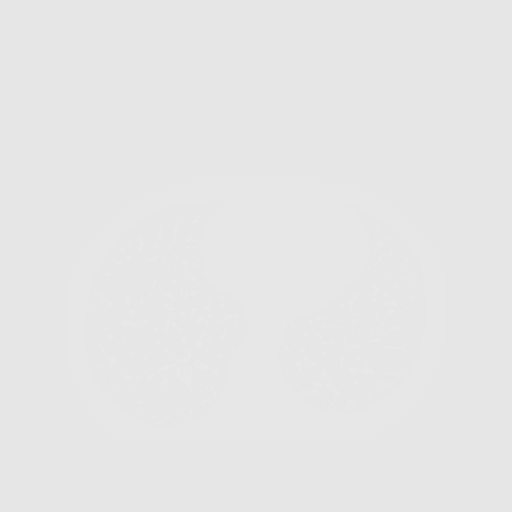
[frame 91/273  lung]
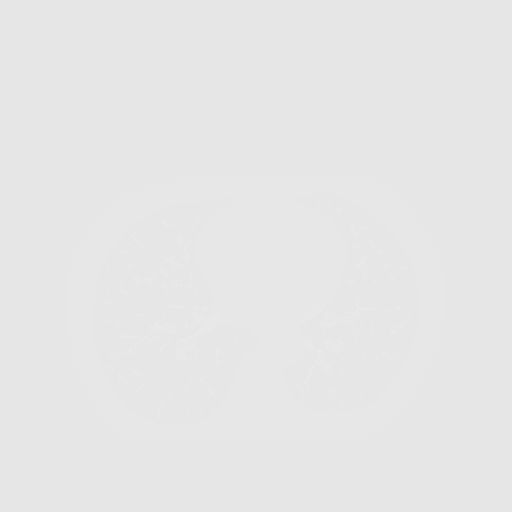
[frame 121/273  mediastinal]
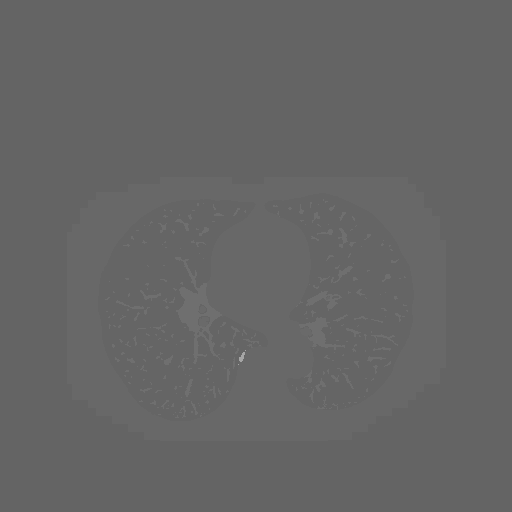
[frame 121/273  lung]
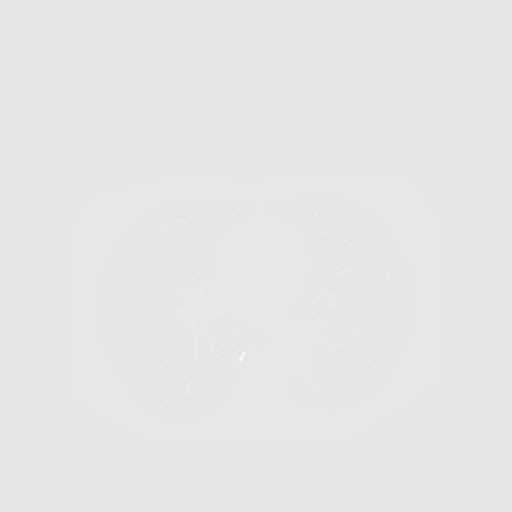
[frame 152/273  lung]
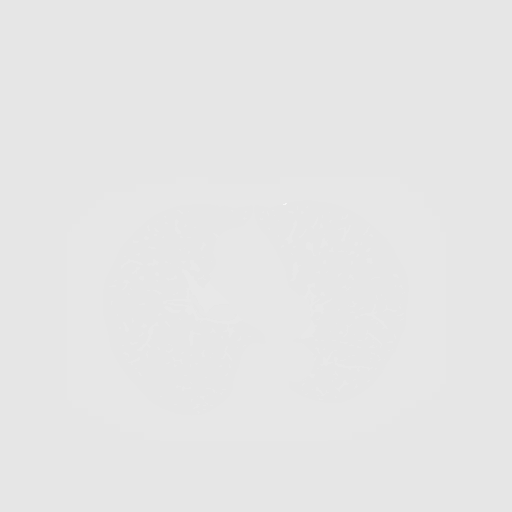
[frame 182/273  lung]
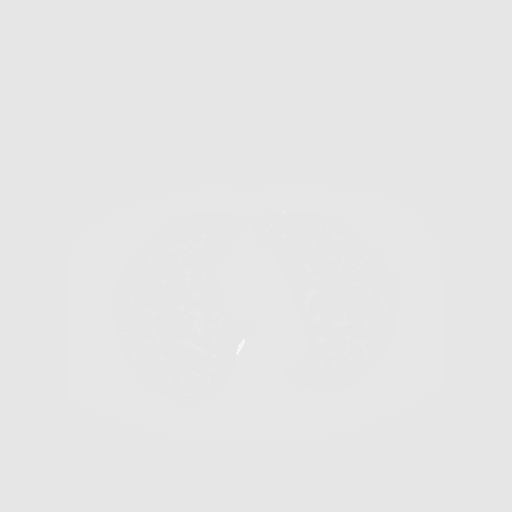
[frame 212/273  lung]
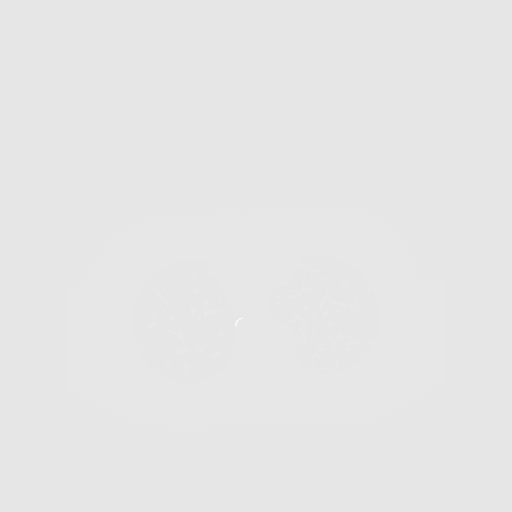
[frame 242/273  mediastinal]
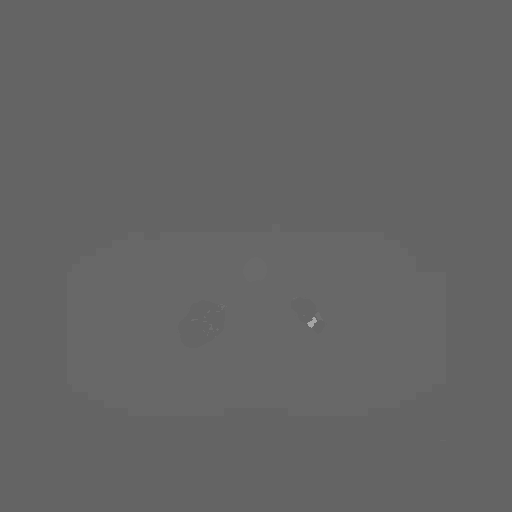
[frame 242/273  lung]
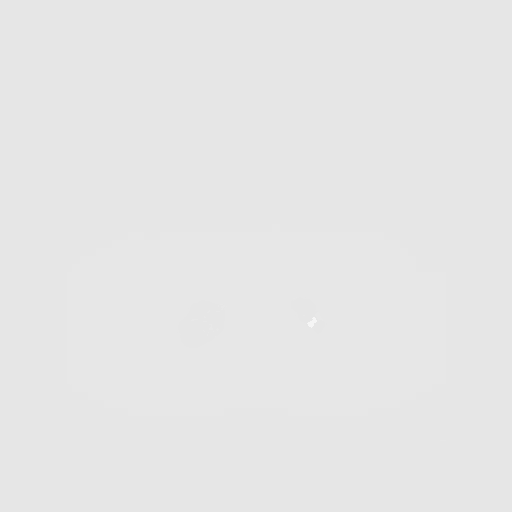
[frame 273/273  lung]
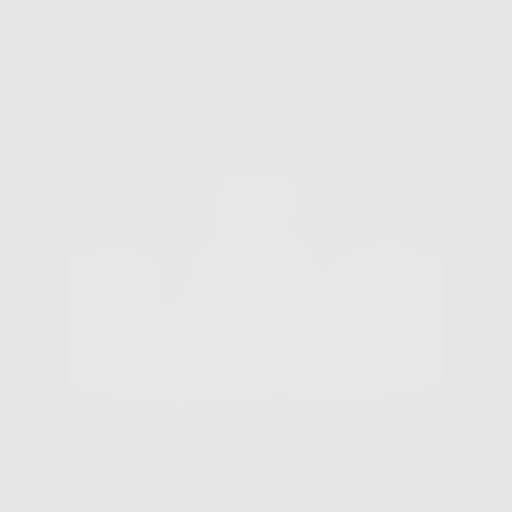

[10 of 20 positions shown; findings below may reference images not displayed]

FINDINGS: Cardiovascular: Aortic atherosclerosis. Normal heart size, without
pericardial effusion. Three vessel coronary artery calcification.

Mediastinum/Nodes: No mediastinal or definite hilar adenopathy,
given limitations of unenhanced CT.

Superior left posterior chest, pleural-based, crescentric soft
tissue density lesion measures 2.7 x 1.7 cm on [DATE] and coronal
image 167.

Lungs/Pleura: No pleural fluid. Mild centrilobular emphysema.
Isolated right lower lobe pulmonary nodule of volume derived
equivalent diameter 4.0 mm.

Upper Abdomen: Normal imaged portions of the liver, spleen. Suspect
surgical changes about the posterior stomach. Paucity of abdominal
retroperitoneal fat degrades evaluation. Abdominal aortic
atherosclerosis.

Musculoskeletal: No acute osseous abnormality.
IMPRESSION: 1. Lung-RADS 2S, benign appearance or behavior. Continue annual
screening with low-dose chest CT without contrast in 12 months.
2. The "S" modifier represents a potentially clinically significant
non pulmonary finding. Posterior left chest soft tissue density
lesion with differential considerations of fibrous tumor of the
pleura, extramedullary hematopoiesis, and posterior
mediastinal/neurogenic tumor. Consider multidisciplinary thoracic
oncology consultation with potential strategies of tissue sampling,
PET, or three-month follow-up chest CT.
3. Age advanced coronary artery atherosclerosis. Recommend
assessment of coronary risk factors and consideration of medical
therapy.
4. Aortic atherosclerosis ([id number].H) and emphysema ([id number].N).

These results will be called to the ordering clinician or
representative by the Radiologist Assistant, and communication
documented in the PACS or [REDACTED].

## 2022-04-18 ENCOUNTER — Ambulatory Visit: Payer: Medicaid Other | Admitting: Vascular Surgery

## 2022-04-18 ENCOUNTER — Encounter: Payer: Self-pay | Admitting: Vascular Surgery

## 2022-04-18 ENCOUNTER — Ambulatory Visit (HOSPITAL_COMMUNITY)
Admission: RE | Admit: 2022-04-18 | Discharge: 2022-04-18 | Disposition: A | Discharge status: Home / Self Care | Payer: Medicaid Other | Source: Ambulatory Visit | Attending: Vascular Surgery | Admitting: Vascular Surgery

## 2022-04-18 VITALS — BP 119/86 | HR 63 | Temp 98.0°F | Resp 20 | Ht 64.0 in | Wt 138.0 lb

## 2022-04-18 DIAGNOSIS — I70222 Atherosclerosis of native arteries of extremities with rest pain, left leg: Secondary | ICD-10-CM

## 2022-04-18 DIAGNOSIS — I739 Peripheral vascular disease, unspecified: Secondary | ICD-10-CM | POA: Insufficient documentation

## 2022-04-18 LAB — VAS US ABI WITH/WO TBI
Left ABI: 0.51
Right ABI: 0.59

## 2022-04-18 NOTE — Progress Notes (Signed)
Subjective:     Patient ID: Brooke Chapman, female   DOB: 03-06-1960, 62 y.o.   MRN: [id number]  HPI 61 year old female status post right common femoral endarterectomy for rest pain.  She states the pain is now resolved.  She does have pain in the left foot which wakes her at night sometimes.  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   Review of Systems Left foot pain    Objective:   Physical Exam Vitals:   04/18/22 1138  BP: 119/86  Pulse: 63  Resp: 20  Temp: 98 F (36.7 C)  SpO2: 98%   Awake alert and oriented Nonlabored respirations Right dorsalis pedis and posterior tibial signals much stronger than the left  ABI Findings:  +---------+------------------+-----+-------------------+--------+  Right   Rt Pressure (mmHg)IndexWaveform           Comment   +---------+------------------+-----+-------------------+--------+  Brachial 136                                                 +---------+------------------+-----+-------------------+--------+  PTA     77                0.54 dampened monophasic          +---------+------------------+-----+-------------------+--------+  DP      84                0.59 monophasic                   +---------+------------------+-----+-------------------+--------+  Great Toe63                0.44 Abnormal                     +---------+------------------+-----+-------------------+--------+   +---------+------------------+-----+-------------------+-------+  Left    Lt Pressure (mmHg)IndexWaveform           Comment  +---------+------------------+-----+-------------------+-------+  Brachial 142                                                +---------+------------------+-----+-------------------+-------+  PTA     72                0.51 dampened monophasic         +---------+------------------+-----+-------------------+-------+  DP      69                0.49 monophasic                   +---------+------------------+-----+-------------------+-------+  Great Toe                       Absent                      +---------+------------------+-----+-------------------+-------+   +-------+-----------+-----------+------------+------------+  ABI/TBIToday's ABIToday's TBIPrevious ABIPrevious TBI  +-------+-----------+-----------+------------+------------+  Right 0.59       0.44       0.63        0.34          +-------+-----------+-----------+------------+------------+  Left  0.51       absent     0.45        0.35          +-------+-----------+-----------+------------+------------+  Right ABIs appear decreased. Left ABIs appear increased.    Summary:  Right: Resting right ankle-brachial index indicates moderate right lower  extremity arterial disease. The right toe-brachial index is abnormal.   Left: Resting left ankle-brachial index indicates moderate left lower  extremity arterial disease.     Assessment/Plan     62 year old female status post right common femoral endarterectomy with well-healed wound.  She has a stent on the left side and does have rest pain with toe pressure of 0.  Plan will be for left common femoral endarterectomy in the near future.  We discussed the risk benefits alternatives she demonstrates good understanding.  She does not have satisfactory vein for bypass and so likely will use vein as a patch as we did on the right side.  All questions were answered today.  She would like to wait until school is out if possible prior to scheduling directed her to protect her foot to prevent any wounds from occurring given her decreased ABI and absent toe pressure.        Kaoru Rezendes C. Donzetta Matters, MD [facility] Office: [phone] Pager: [phone]

## 2022-04-19 ENCOUNTER — Telehealth: Payer: Self-pay

## 2022-04-19 NOTE — Telephone Encounter (Signed)
Attempted to reach patient to schedule surgery. Left voicemail message for patient to return call.

## 2022-04-20 ENCOUNTER — Other Ambulatory Visit: Payer: Self-pay

## 2022-04-20 DIAGNOSIS — I70222 Atherosclerosis of native arteries of extremities with rest pain, left leg: Secondary | ICD-10-CM

## 2022-04-20 NOTE — Telephone Encounter (Signed)
Spoke with patient and scheduled left common femoral endarterectomy surgery for June 4 per patient's request. Instructions provided and patient verbalized understanding.

## 2022-04-24 NOTE — Telephone Encounter (Signed)
Received a call from patient requesting to move up her surgery date to the week of May 20th. Surgery rescheduled for May 21. Instructions reviewed and resent to MyChart with updated date. Patient voiced understanding.

## 2022-05-24 ENCOUNTER — Other Ambulatory Visit: Payer: Self-pay | Admitting: Internal Medicine

## 2022-05-31 ENCOUNTER — Ambulatory Visit: Payer: Medicaid Other | Admitting: Urology

## 2022-06-12 NOTE — Pre-Procedure Instructions (Signed)
Surgical Instructions    Your procedure is scheduled on Jun 19, 2022.  Report to [facility] Main Entrance "A" at 5:30 A.M., then check in with the Admitting office.  Call this number if you have problems the morning of surgery:  [phone]  If you have any questions prior to your surgery date call [phone]: Open Monday-Friday 8am-4pm If you experience any cold or flu symptoms such as cough, fever, chills, shortness of breath, etc. between now and your scheduled surgery, please notify us at the above number.     Remember:  Do not eat or drink after midnight the night before your surgery     Take these medicines the morning of surgery with A SIP OF WATER:  amLODipine (NORVASC)   aspirin   atorvastatin (LIPITOR)   carvedilol (COREG)   cilostazol (PLETAL)   nitroGLYCERIN (NITROSTAT) - may take if needed    As of today, STOP taking any Aspirin (unless otherwise instructed by your surgeon) Aleve, Naproxen, Ibuprofen, Motrin, Advil, Goody's, BC's, all herbal medications, fish oil, and all vitamins.                     Do NOT Smoke (Tobacco/Vaping) for 24 hours prior to your procedure.  If you use a CPAP at night, you may bring your mask/headgear for your overnight stay.   Contacts, glasses, piercing's, hearing aid's, dentures or partials may not be worn into surgery, please bring cases for these belongings.    For patients admitted to the hospital, discharge time will be determined by your treatment team.   Patients discharged the day of surgery will not be allowed to drive home, and someone needs to stay with them for 24 hours.  SURGICAL WAITING ROOM VISITATION Patients having surgery or a procedure may have no more than 2 support people in the waiting area - these visitors may rotate.   Children under the age of 25 must have an adult with them who is not the patient. If the patient needs to stay at the hospital during part of their recovery, the visitor guidelines for  inpatient rooms apply. Pre-op nurse will coordinate an appropriate time for 1 support person to accompany patient in pre-op.  This support person may not rotate.   Please refer to the [facility] website for the visitor guidelines for Inpatients (after your surgery is over and you are in a regular room).    Special instructions:   [place]- Preparing For Surgery  Before surgery, you can play an important role. Because skin is not sterile, your skin needs to be as free of germs as possible. You can reduce the number of germs on your skin by washing with CHG (chlorahexidine gluconate) Soap before surgery.  CHG is an antiseptic cleaner which kills germs and bonds with the skin to continue killing germs even after washing.    Oral Hygiene is also important to reduce your risk of infection.  Remember - BRUSH YOUR TEETH THE MORNING OF SURGERY WITH YOUR REGULAR TOOTHPASTE  Please do not use if you have an allergy to CHG or antibacterial soaps. If your skin becomes reddened/irritated stop using the CHG.  Do not shave (including legs and underarms) for at least 48 hours prior to first CHG shower. It is OK to shave your face.  Please follow these instructions carefully.   Shower the NIGHT BEFORE SURGERY and the MORNING OF SURGERY  If you chose to wash your hair, wash your hair first as usual  with your normal shampoo.  After you shampoo, rinse your hair and body thoroughly to remove the shampoo.  Use CHG Soap as you would any other liquid soap. You can apply CHG directly to the skin and wash gently with a scrungie or a clean washcloth.   Apply the CHG Soap to your body ONLY FROM THE NECK DOWN.  Do not use on open wounds or open sores. Avoid contact with your eyes, ears, mouth and genitals (private parts). Wash Face and genitals (private parts)  with your normal soap.   Wash thoroughly, paying special attention to the area where your surgery will be performed.  Thoroughly rinse your body with  warm water from the neck down.  DO NOT shower/wash with your normal soap after using and rinsing off the CHG Soap.  Pat yourself dry with a CLEAN TOWEL.  Wear CLEAN PAJAMAS to bed the night before surgery  Place CLEAN SHEETS on your bed the night before your surgery  DO NOT SLEEP WITH PETS.   Day of Surgery: Take a shower with CHG soap. Do not wear jewelry or makeup Do not wear lotions, powders, perfumes/colognes, or deodorant. Do not shave 48 hours prior to surgery.  Men may shave face and neck. Do not bring valuables to the hospital.  [facility] is not responsible for any belongings or valuables. Do not wear nail polish, gel polish, artificial nails, or any other type of covering on natural nails (fingers and toes) If you have artificial nails or gel coating that need to be removed by a nail salon, please have this removed prior to surgery. Artificial nails or gel coating may interfere with anesthesia's ability to adequately monitor your vital signs.  Wear Clean/Comfortable clothing the morning of surgery Remember to brush your teeth WITH YOUR REGULAR TOOTHPASTE.   Please read over the following fact sheets that you were given.    If you received a COVID test during your pre-op visit  it is requested that you wear a mask when out in public, stay away from anyone that may not be feeling well and notify your surgeon if you develop symptoms. If you have been in contact with anyone that has tested positive in the last 10 days please notify you surgeon.

## 2022-06-13 ENCOUNTER — Encounter (HOSPITAL_COMMUNITY): Payer: Self-pay | Admitting: Vascular Surgery

## 2022-06-13 ENCOUNTER — Other Ambulatory Visit: Payer: Self-pay

## 2022-06-13 ENCOUNTER — Encounter (HOSPITAL_COMMUNITY)
Admission: RE | Admit: 2022-06-13 | Discharge: 2022-06-13 | Disposition: A | Discharge status: Home / Self Care | Payer: Medicaid Other | Source: Ambulatory Visit | Attending: Vascular Surgery | Admitting: Vascular Surgery

## 2022-06-13 ENCOUNTER — Encounter (HOSPITAL_COMMUNITY): Payer: Self-pay

## 2022-06-13 VITALS — BP 210/117 | HR 81 | Temp 98.2°F | Resp 18 | Ht 64.0 in | Wt 140.0 lb

## 2022-06-13 DIAGNOSIS — I70222 Atherosclerosis of native arteries of extremities with rest pain, left leg: Secondary | ICD-10-CM | POA: Diagnosis not present

## 2022-06-13 DIAGNOSIS — I129 Hypertensive chronic kidney disease with stage 1 through stage 4 chronic kidney disease, or unspecified chronic kidney disease: Secondary | ICD-10-CM | POA: Diagnosis not present

## 2022-06-13 DIAGNOSIS — I700 Atherosclerosis of aorta: Secondary | ICD-10-CM | POA: Diagnosis not present

## 2022-06-13 DIAGNOSIS — R7303 Prediabetes: Secondary | ICD-10-CM | POA: Diagnosis not present

## 2022-06-13 DIAGNOSIS — D631 Anemia in chronic kidney disease: Secondary | ICD-10-CM | POA: Diagnosis not present

## 2022-06-13 DIAGNOSIS — N1832 Chronic kidney disease, stage 3b: Secondary | ICD-10-CM | POA: Diagnosis not present

## 2022-06-13 DIAGNOSIS — I2510 Atherosclerotic heart disease of native coronary artery without angina pectoris: Secondary | ICD-10-CM | POA: Diagnosis not present

## 2022-06-13 DIAGNOSIS — I350 Nonrheumatic aortic (valve) stenosis: Secondary | ICD-10-CM | POA: Insufficient documentation

## 2022-06-13 DIAGNOSIS — I493 Ventricular premature depolarization: Secondary | ICD-10-CM | POA: Diagnosis not present

## 2022-06-13 DIAGNOSIS — Z01818 Encounter for other preprocedural examination: Secondary | ICD-10-CM

## 2022-06-13 DIAGNOSIS — J439 Emphysema, unspecified: Secondary | ICD-10-CM | POA: Diagnosis not present

## 2022-06-13 DIAGNOSIS — E785 Hyperlipidemia, unspecified: Secondary | ICD-10-CM | POA: Insufficient documentation

## 2022-06-13 LAB — CBC
HCT: 42.4 % (ref 36.0–46.0)
Hemoglobin: 13.6 g/dL (ref 12.0–15.0)
MCH: 30.8 pg (ref 26.0–34.0)
MCHC: 32.1 g/dL (ref 30.0–36.0)
MCV: 96.1 fL (ref 80.0–100.0)
Platelets: 201 10*3/uL (ref 150–400)
RBC: 4.41 MIL/uL (ref 3.87–5.11)
RDW: 14.5 % (ref 11.5–15.5)
WBC: 4.7 10*3/uL (ref 4.0–10.5)
nRBC: 0 % (ref 0.0–0.2)

## 2022-06-13 LAB — APTT: aPTT: 29 seconds (ref 24–36)

## 2022-06-13 LAB — COMPREHENSIVE METABOLIC PANEL
ALT: 13 U/L (ref 0–44)
AST: 22 U/L (ref 15–41)
Albumin: 3.7 g/dL (ref 3.5–5.0)
Alkaline Phosphatase: 73 U/L (ref 38–126)
Anion gap: 9 (ref 5–15)
BUN: 11 mg/dL (ref 8–23)
CO2: 30 mmol/L (ref 22–32)
Calcium: 9.5 mg/dL (ref 8.9–10.3)
Chloride: 97 mmol/L — ABNORMAL LOW (ref 98–111)
Creatinine, Ser: 1.23 mg/dL — ABNORMAL HIGH (ref 0.44–1.00)
GFR, Estimated: 49 mL/min — ABNORMAL LOW (ref >60)
Glucose, Bld: 87 mg/dL (ref 70–99)
Potassium: 4.1 mmol/L (ref 3.5–5.1)
Sodium: 136 mmol/L (ref 135–145)
Total Bilirubin: 0.6 mg/dL (ref 0.3–1.2)
Total Protein: 7.9 g/dL (ref 6.5–8.1)

## 2022-06-13 LAB — URINALYSIS, ROUTINE W REFLEX MICROSCOPIC
Bacteria, UA: NONE SEEN
Bilirubin Urine: NEGATIVE
Glucose, UA: NEGATIVE mg/dL
Hgb urine dipstick: NEGATIVE
Ketones, ur: NEGATIVE mg/dL
Leukocytes,Ua: NEGATIVE
Nitrite: NEGATIVE
Protein, ur: 30 mg/dL — AB
Specific Gravity, Urine: 1.011 (ref 1.005–1.030)
pH: 6 (ref 5.0–8.0)

## 2022-06-13 LAB — SURGICAL PCR SCREEN
MRSA, PCR: NEGATIVE
Staphylococcus aureus: NEGATIVE

## 2022-06-13 LAB — PROTIME-INR
INR: 1.1 (ref 0.8–1.2)
Prothrombin Time: 14.4 seconds (ref 11.4–15.2)

## 2022-06-13 LAB — TYPE AND SCREEN
ABO/RH(D): B POS
Antibody Screen: NEGATIVE

## 2022-06-13 NOTE — Progress Notes (Addendum)
PCP - Dr. Jonah Blue (Pt confirmed an appointment with PCP tomorrow, May 16th) Cardiologist - Dr. Doreatha Lew (Last saw May 2023. Note stated a 6 month follow-up but pt has not been back to see)  PPM/ICD - Denies Device Orders - n/a Rep Notified - n/a  Chest x-ray - n/a EKG - 06/13/2022 Stress Test - 06/23/2021 ECHO - 07/05/2021 Cardiac Cath - Denies  Sleep Study - Denies CPAP - n/a  Pt is Pre-DM  Last dose of GLP1 agonist- n/a GLP1 instructions: n/a  Blood Thinner/ASA Instructions: Per surgeon instructions, pt will continue to take Pletal and ASA through the morning of surgery.  NPO after midnight  COVID TEST- n/a   Anesthesia review: Yes. Cardiac Hx. BP 210/117 at PAT appointment. Pt states she only took her Coreg and Pletal this morning, because she can take them on an empty stomach. She has to take her other BP meds with food. Pt advised to take them when she gets home. BP rechecked 178/118 at end of visit.   Patient denies shortness of breath, fever, cough and chest pain at PAT appointment. Pt denies any respiratory illness/infection in the last two months.   All instructions explained to the patient, with a verbal understanding of the material. Patient agrees to go over the instructions while at home for a better understanding. Patient also instructed to self quarantine after being tested for COVID-19. The opportunity to ask questions was provided.

## 2022-06-14 ENCOUNTER — Ambulatory Visit: Payer: Medicaid Other | Attending: Internal Medicine | Admitting: Internal Medicine

## 2022-06-14 VITALS — BP 210/100 | HR 72 | Temp 98.3°F | Wt 139.0 lb

## 2022-06-14 DIAGNOSIS — G43109 Migraine with aura, not intractable, without status migrainosus: Secondary | ICD-10-CM | POA: Diagnosis not present

## 2022-06-14 DIAGNOSIS — I739 Peripheral vascular disease, unspecified: Secondary | ICD-10-CM | POA: Diagnosis not present

## 2022-06-14 DIAGNOSIS — F17200 Nicotine dependence, unspecified, uncomplicated: Secondary | ICD-10-CM

## 2022-06-14 DIAGNOSIS — N1831 Chronic kidney disease, stage 3a: Secondary | ICD-10-CM

## 2022-06-14 DIAGNOSIS — I10 Essential (primary) hypertension: Secondary | ICD-10-CM

## 2022-06-14 DIAGNOSIS — N289 Disorder of kidney and ureter, unspecified: Secondary | ICD-10-CM | POA: Diagnosis not present

## 2022-06-14 MED ORDER — TOPIRAMATE 25 MG PO TABS
25.0000 mg | ORAL_TABLET | Freq: Every day | ORAL | 2 refills | Status: DC
Start: 2022-06-14 — End: 2023-01-22

## 2022-06-14 NOTE — Anesthesia Preprocedure Evaluation (Deleted)
Anesthesia Evaluation    Airway        Dental   Pulmonary Current Smoker          Cardiovascular hypertension,   Echo 07/05/21: IMPRESSIONS  1. Left ventricular ejection fraction by 3D volume is 55 %. The left  ventricle has normal function. The left ventricle has no regional wall  motion abnormalities. There is mild asymmetric left ventricular  hypertrophy of the basal-septal segment. Left  ventricular diastolic parameters are consistent with Grade I diastolic  dysfunction (impaired relaxation).  2. Right ventricular systolic function is normal. The right ventricular  size is normal. There is normal pulmonary artery systolic pressure. The  estimated right ventricular systolic pressure is 26.8 mmHg.  3. The mitral valve is grossly normal. Mild mitral valve regurgitation.  No evidence of mitral stenosis.  4. The aortic valve is tricuspid. There is moderate calcification of the  aortic valve. There is moderate thickening of the aortic valve. Aortic  valve regurgitation is not visualized. Mild aortic valve stenosis. Aortic  valve area, by VTI measures 1.20  cm. Aortic valve mean gradient measures 13.0 mmHg. Aortic valve Vmax  measures 2.51 m/s.  5. The inferior vena cava is normal in size with greater than 50%  respiratory variability, suggesting right atrial pressure of 3 mmHg.    Nuclear stress test 06/23/21:  The study is normal. Findings are consistent with no prior ischemia and no prior myocardial infarction. The study is low risk.  No ST deviation was noted.  LV perfusion is normal. There is no evidence of ischemia. There is no evidence of infarction.  Left ventricular function is normal. Nuclear stress EF: 54 %. The left ventricular ejection fraction is mildly decreased (45-54%). End diastolic cavity size is normal. End systolic cavity size is normal.  Prior study not available for comparison.    Neuro/Psych     GI/Hepatic   Endo/Other    Renal/GU      Musculoskeletal   Abdominal   Peds  Hematology   Anesthesia Other Findings   Reproductive/Obstetrics                             Anesthesia Physical Anesthesia Plan  ASA:   Anesthesia Plan:    Post-op Pain Management:    Induction:   PONV Risk Score and Plan:   Airway Management Planned:   Additional Equipment:   Intra-op Plan:   Post-operative Plan:   Informed Consent:   Plan Discussed with:   Anesthesia Plan Comments: (See PAT note written 06/14/2022 by Shonna Chock, PA-C.  )       Anesthesia Quick Evaluation

## 2022-06-14 NOTE — Progress Notes (Signed)
Anesthesia Chart Review:  Case: [id number] Date/Time: 06/19/22 0715   Procedure: LEFT COMMON FEMORAL ENDARTERECTOMY (Left)   Anesthesia type: General   Pre-op diagnosis: Critical limb ischemia of left lower extremity with rest pain   [facility]: [facility] ROOM 11 / [facility] OR   Surgeons: Maeola Harman, MD       DISCUSSION: Patient is a 62 year old female scheduled for the above procedure. Her RLE rest pain improved following right femoral endarterectomy on 02/20/22. Above procedure recommended now for LLE rest pain with toe pressure of 0.   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   Last visit with cardiologist Dr. Tomie China was on 06/15/21 for episode of chest pain when BP elevated. Also with murmur on exam. Echo in June 2023 showed normal LVEF, no regional wall motion abnormalities, mild asymmetric LV hypertrophy of the basal septal segment, grade 1 diastolic dysfunction, normal RV systolic function, normal PASP, mild MR, mild AS. She had a non-ischemic stress test on 06/23/21. Follow-up in ~ 6 months had been planned. She denied any new or progressive symptoms since then.    BP at 06/13/22 visit was elevated at 210/117 and 178/118. She had only taken her Coreg and Pletal because she didn't want to take her other medications on an empty stomach. She denied chest or SOB. She said she would take her amlodipine and Diovan HCT when she arrived home from PAT. She also has a home BP monitor, and was scheduled for routine follow-up with Marcine Matar, MD on 06/14/22.   Unfortunately, at her 06/14/22 visit with Dr. Laural Benes, her BP was still significantly elevated at 210/100. She also admitted to  still not taking amlodipine and Diovan HCT for two days. She was again instructed to take medications once at home and stressed compliance and risks of not taking. She also reported a intermittent headache for 2 months with throbbing pulsation over her left eye with tearing and some photophobia. Symptoms relieved with Tylenol and sleep. No associated N/V. Dr. Laural Benes felt headaches were likely migraine or cluster headaches, although uncontrolled BP could be contributing. Given her age, brain MRI was ordered. She was started on low dose Topamax and referred to neurology for headache follow-up. Smoking cessation advised. She notes plans for vascular surgery next week. BP follow-up in 1-2 weeks recommended.   I updated anesthesiologist Autumn Patty, MD. I also sent communication to Dr. Randie Heinz. If she is compliant with her anti-hypertensive regimen then hopefully BP will be better by her surgery date, otherwise she is at risk for surgery cancellation or delay. Per posting, "Continue ASA and Pletal".    VS: BP (!) 210/117   Pulse 81   Temp 36.8 C   Resp 18   Ht 5\' 4"  (1.626 m)   Wt 63.5 kg   SpO2 100%   BMI 24.03 kg/m    PROVIDERS: Marcine Matar, MD is PCP [facility]) - Revankar, Glean Hess, MD is cardiologist - Lemar Livings, MD is vascular surgeon - Charlett Lango, MD is CT surgeon. 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 One year follow-up with chest CT planned.  Di Kindle, MD  is urologist. Last visit 01/25/22 for atrophic right kidney and left renal mass that had been stable for 18 months, and felt likely benign. Repeat six month imaging planned. Next visit scheduled for 06/15/22.     LABS: Labs reviewed: Acceptable for surgery. (all labs ordered are listed, but only abnormal results are displayed)  Labs Reviewed   COMPREHENSIVE METABOLIC PANEL - Abnormal; Notable for the following components:      Result Value   Chloride 97 (*)    Creatinine, Ser 1.23 (*)    GFR, Estimated 49 (*)    All other components within normal limits  URINALYSIS, ROUTINE W REFLEX MICROSCOPIC - Abnormal; Notable for the following components:   Protein, ur 30 (*)    All other components within normal limits  SURGICAL PCR SCREEN  CBC  PROTIME-INR  APTT  TYPE AND SCREEN     IMAGES: MRI Abd 11/03/21: IMPRESSION: 1. Rounded, intermediate signal, nonenhancing lesion which is closely apposed to the medial spleen, superior pole of the left kidney, and left adrenal gland does not appear to be fluid in character and measures 2.0 x 1.3 cm. This is of uncertain nature although almost certainly benign given established stability and lack of contrast enhancement. 2. Small focus of arterial hyperenhancement of the liver dome, hepatic segment VIII, measuring 1.1 x 0.8 cm, which isoenhancing on subsequent contrast enhanced phases and without underlying intrinsic signal abnormality. This may reflect a benign transient hepatic intensity difference or perhaps a small focal nodular hyperplasia, although almost certainly benign and of no significance in the absence of known chronic high risk liver disease. 3. Markedly atrophic right kidney consistent with prior infectious, obstructive, or ischemic insult.   CT Chest lung cancer screening 09/29/21: IMPRESSION: 1. Lung-RADS 2, benign appearance or behavior. Continue annual screening with low-dose chest CT without contrast in 12 months. 2. Indeterminate 3.2 x 1.6 cm soft tissue mass in the superior aspect of the posterior left mediastinum/medial left pleural space, stable from 06/09/2020 CT, differential includes neurogenic tumor versus solitary fibrous tumor of the pleura, as reported previously. 3. Indeterminate exophytic isodense 1.7 cm upper left renal cortical lesion, stable from  06/17/2020 CT, cannot exclude indolent renal neoplasm. MRI (preferred) or CT abdomen without and with IV contrast may be considered for further characterization at this time. 4. Three-vessel coronary atherosclerosis. 5. Aortic Atherosclerosis (ICD10-I70.0) and Emphysema (ICD10-J43.9).     EKG:  EKG 06/13/22: Normal sinus rhythm Possible Left atrial enlargement Septal infarct , age undetermined Abnormal ECG - Overall, I think her EKG is stable when compared to 06/15/21 tracing.  EKG 06/15/21 ([facility]): Normal sinus rhythm Possible left atrial enlargement Anteroseptal infarct, age undetermined     CV: Echo 07/05/21: IMPRESSIONS   1. Left ventricular ejection fraction by 3D volume is 55 %. The left  ventricle has normal function. The left ventricle has no regional wall  motion abnormalities. There is mild asymmetric left ventricular  hypertrophy of the basal-septal segment. Left  ventricular diastolic parameters are consistent with Grade I diastolic  dysfunction (impaired relaxation).   2. Right ventricular systolic function is normal. The right ventricular  size is normal. There is normal pulmonary artery systolic pressure. The  estimated right ventricular systolic pressure is 26.8 mmHg.   3. The mitral valve is grossly normal. Mild mitral valve regurgitation.  No evidence of mitral stenosis.   4. The aortic valve is tricuspid. There is moderate calcification of the  aortic valve. There is moderate thickening of the aortic valve. Aortic  valve regurgitation is not visualized. Mild aortic valve stenosis. Aortic  valve area, by VTI measures 1.20  cm. Aortic valve mean gradient measures 13.0 mmHg. Aortic valve Vmax  measures 2.51 m/s.   5. The inferior vena cava is normal in size with greater than 50%  respiratory variability, suggesting right atrial pressure of 3 mmHg.      Nuclear stress test 06/23/21:   The study is normal. Findings are consistent with no prior ischemia  and no prior myocardial infarction. The study is low risk.   No ST deviation was noted.   LV perfusion is normal. There is no evidence of ischemia. There is no evidence of infarction.   Left ventricular function is normal. Nuclear stress EF: 54 %. The left ventricular ejection fraction is mildly decreased (45-54%). End diastolic cavity size is normal. End systolic cavity size is normal.   Prior study not available for comparison.    Long term monitor 12/18/19 - 12/21/19: - A ZIO monitor was performed for 3 days beginning 12/18/2019 to assess palpitation. - Cardiac rhythm throughout was sinus with average, minimum of maximum heart rates of 83, 1620 bpm. - There were no pauses of 3 seconds or greater and no episodes of second or third-degree AV block or sinus node exit block. - Ventricular ectopy was rare with isolated PVCs and couplets. - Supraventricular ectopy was rare and there were no episodes of atrial fibrillation or flutter. - There were 7 triggered events 2 associated with frequent PVCs and the others were an accelerated junctional rhythm at a rate of approximately 100 bpm. Conclusion: Although the frequency of ventricular ectopy is rare there is symptomatic PVCs, also brief accelerated junctional rhythm.   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 Dorris Fetch, patient opted for surveillance imaging (12/12/21)   PAD (peripheral artery disease) (HCC)    Porokeratosis 08/26/2018   Pre-diabetes    PVD  (peripheral vascular disease) (HCC) 08/26/2018   Skin lesion 05/26/2021   Tobacco dependence 05/01/2018   Unintended weight loss 01/13/2020    Past Surgical History:  Procedure Laterality Date   ABDOMINAL AORTOGRAM W/LOWER EXTREMITY Bilateral 07/13/2019   Procedure: ABDOMINAL AORTOGRAM W/LOWER EXTREMITY;  Surgeon: Maeola Harman, MD;  Location: [facility] INVASIVE CV LAB;  Service: Cardiovascular;  Laterality: Bilateral;   ABDOMINAL AORTOGRAM W/LOWER EXTREMITY N/A 01/08/2022   Procedure: ABDOMINAL AORTOGRAM W/LOWER EXTREMITY;  Surgeon: Victorino Sparrow, MD;  Location: [facility] INVASIVE CV LAB;  Service: Vascular;  Laterality: N/A;   BREAST EXCISIONAL BIOPSY Left    LESS THAN 5 YEARS AGO   COLONOSCOPY     ENDARTERECTOMY FEMORAL Right 02/20/2022   Procedure: RIGHT COMMON FEMORAL ENDARTERECTOMY WITH VEIN PATCH;  Surgeon: Maeola Harman, MD;  Location: [facility] OR;  Service: Vascular;  Laterality: Right;   PERIPHERAL VASCULAR INTERVENTION Left 07/13/2019   Procedure: PERIPHERAL VASCULAR INTERVENTION;  Surgeon: Maeola Harman, MD;  Location: [facility] INVASIVE CV LAB;  Service: Cardiovascular;  Laterality: Left;  common iliac  MEDICATIONS:  amLODipine (NORVASC) 10 MG tablet   aspirin 81 MG chewable tablet   atorvastatin (LIPITOR) 20 MG tablet   Blood Pressure Monitor DEVI   carvedilol (COREG) 25 MG tablet   cilostazol (PLETAL) 100 MG tablet   ferrous sulfate 325 (65 FE) MG tablet   nitroGLYCERIN (NITROSTAT) 0.4 MG SL tablet   potassium chloride (KLOR-CON) 8 MEQ tablet   topiramate (TOPAMAX) 25 MG tablet   valsartan-hydrochlorothiazide (DIOVAN-HCT) 320-25 MG tablet    cloNIDine (CATAPRES) tablet 0.1 mg    Shonna Chock, PA-C Surgical Short Stay/Anesthesiology [facility] Phone [phone] [facility] Phone [phone] 06/14/2022 5:10 PM

## 2022-06-14 NOTE — Patient Instructions (Signed)
Your blood pressure is elevated today.  This increases your risk for heart attack and strokes.  It also negatively affects your kidneys.  Please take your medications as prescribed.  We have prescribed a new medication called Topamax to take at bedtime to help decrease the frequency of your migraine headaches.

## 2022-06-14 NOTE — Progress Notes (Signed)
Patient ID: Brooke Chapman, female    DOB: [birth year]  MRN: [id number]  CC:  chronic ds management   Subjective: Brooke Chapman is a 62 y.o. female who presents for chronic ds management Her concerns today include:  Patient with history of PAD (s/P RT CFA endarterectomy), aortic atherosclerosis, emphysema changes seen on CT 05/2020, HTN, tob dep, HL, urge incont, IDA, RT LLL nodule and posterior mediastinal mass on CT 05/2020 (followed by CT surgeon Dr. Dorris Fetch, questionably benign schwannoma.  Patient not wanting biopsy.  Plan to monitor with repeat imaging 09/2022), left renal mass (Saw urologist Dr. Pete Glatter 01/26/2022.  Given stability in size, he thinks it is likely benign).   Since last visit with me, she had right common femoral artery endarterectomy for rest pain. No pain since then.  Dr. Randie Heinz plans to do the left side next wk on 06/19/2022.  HTN: Should be on carvedilol 25 mg BID, Diovan/HCTZ 320/25 mg daily and Norvasc 10 mg daily.  Forgot to take meds for 2 days; just got off schedule since being home from work this wk -NO CP/SOB.  Endorses HA over LT eye with photophobia and tearing 2-3x/wk for past 2 mths.  No N/V. Can feel when HA is coming on because she gets pulsating LT eye. Relieve with a Tylenol and sleep.  Does not allow it to go on for too long without taking Tylenol. -GFR has been b/w 41-51.  most recent GFR was 49.  She is not on any NSAIDs. HL:  taking Lipitor but forgot to take for the past 2 days.   Tob dep: down to 5 cigarettes a day   IDA:  reports she stopped taking iron tablets since she last saw me.  Causes constipation.  Most recent H/H was 13.6/42.  Left renal mass: Due for follow-up with urology Dr. Pete Glatter whom she last saw 01/25/2022.  He wanted her to follow-up in 4 to 6 months.  He plans to repeat her imaging but overall feels that this is a benign lesion.  Patient Active Problem List   Diagnosis Date Noted   Lupus (HCC) 02/08/2022   Kidney lesion,  native, left 02/08/2022   Paraspinal mass 02/08/2022   Renal mass; left - non-enhancing, stable in size x 18 months 01/25/2022   Atrophic kidney, right 01/25/2022   CKD (chronic kidney disease) stage 3, GFR 30-59 ml/min (HCC) 11/11/2021   Coronary artery disease 06/15/2021   Chest pain of uncertain etiology 06/15/2021   Cardiac murmur 06/15/2021   Anemia 06/13/2021   Skin lesion 05/26/2021   GAD (generalized anxiety disorder) 05/01/2020   Black stools 01/13/2020   Unintended weight loss 01/13/2020   Antiplatelet or antithrombotic long-term use 01/13/2020   Hypertension    Frontal balding 12/28/2019   Iron deficiency anemia 12/28/2019   Essential hypertension    Porokeratosis 08/26/2018   PVD (peripheral vascular disease) (HCC) 08/26/2018   Hyperlipidemia 05/02/2018   HTN (hypertension) 05/01/2018   Tobacco dependence 05/01/2018   PAD (peripheral artery disease) (HCC)      Current Outpatient Medications on File Prior to Visit  Medication Sig Dispense Refill   amLODipine (NORVASC) 10 MG tablet Take 1 tablet (10 mg total) by mouth daily. 30 tablet 3   aspirin 81 MG chewable tablet Chew 1 tablet (81 mg total) by mouth daily. 100 tablet 1   atorvastatin (LIPITOR) 20 MG tablet Take 1.5 tablets (30 mg total) by mouth daily. 135 tablet 3   Blood Pressure Monitor DEVI Use  as directed to check home blood pressure 2-3 times a week 1 Device 0   carvedilol (COREG) 25 MG tablet Take 1 tablet (25 mg total) by mouth 2 (two) times daily. 180 tablet 3   cilostazol (PLETAL) 100 MG tablet Take 1 tablet (100 mg total) by mouth 2 (two) times daily. 180 tablet 3   ferrous sulfate 325 (65 FE) MG tablet Take 1 tablet (325 mg total) by mouth 2 (two) times daily with a meal. (Patient taking differently: Take 325 mg by mouth See admin instructions. Take 325mg  by mouth biweekly.) 100 tablet 1   potassium chloride (KLOR-CON) 8 MEQ tablet Take 2 tablets (16 mEq total) by mouth daily. 180 tablet 1    valsartan-hydrochlorothiazide (DIOVAN-HCT) 320-25 MG tablet Take 1 tablet by mouth once daily 90 tablet 0   nitroGLYCERIN (NITROSTAT) 0.4 MG SL tablet Place 1 tablet (0.4 mg total) under the tongue every 5 (five) minutes as needed. 25 tablet 6   Current Facility-Administered Medications on File Prior to Visit  Medication Dose Route Frequency Provider Last Rate Last Admin   cloNIDine (CATAPRES) tablet 0.1 mg  0.1 mg Oral Once Sharon Seller, Angela M, PA-C        Allergies  Allergen Reactions   Latex Rash    Social History   Socioeconomic History   Marital status: Single    Spouse name: Not on file   Number of children: 0   Years of education: Not on file   Highest education level: GED or equivalent  Occupational History   Occupation: Prep cook  Tobacco Use   Smoking status: Every Day    Packs/day: .5    Types: Cigarettes   Smokeless tobacco: Never   Tobacco comments:    5 cigs daily  Vaping Use   Vaping Use: Never used  Substance and Sexual Activity   Alcohol use: Not Currently    Alcohol/week: 2.0 standard drinks of alcohol    Types: 2 Standard drinks or equivalent per week   Drug use: Never   Sexual activity: Not Currently  Other Topics Concern   Not on file  Social History Narrative   Not on file   Social Determinants of Health   Financial Resource Strain: Medium Risk (06/14/2022)   Overall Financial Resource Strain (CARDIA)    Difficulty of Paying Living Expenses: Somewhat hard  Food Insecurity: Food Insecurity Present (06/14/2022)   Hunger Vital Sign    Worried About Running Out of Food in the Last Year: Sometimes true    Ran Out of Food in the Last Year: Often true  Transportation Needs: No Transportation Needs (06/14/2022)   PRAPARE - Administrator, Civil Service (Medical): No    Lack of Transportation (Non-Medical): No  Physical Activity: Not on file  Stress: No Stress Concern Present (06/14/2022)   [employer] of Occupational Health -  Occupational Stress Questionnaire    Feeling of Stress : Only a little  Social Connections: Unknown (06/14/2022)   Social Connection and Isolation Panel [NHANES]    Frequency of Communication with Friends and Family: More than three times a week    Frequency of Social Gatherings with Friends and Family: Never    Attends Religious Services: Patient declined    Database administrator or Organizations: No    Attends Engineer, structural: Not on file    Marital Status: Living with partner  Intimate Partner Violence: Not on file    Family History  Problem Relation Age of  Onset   Heart disease Mother    Hypertension Mother    Hyperlipidemia Mother    Hypertension Brother    Heart disease Maternal Aunt    Hypertension Maternal Aunt    Hyperlipidemia Maternal Aunt    Colon cancer Neg Hx    Liver cancer Neg Hx    Rectal cancer Neg Hx    Stomach cancer Neg Hx     Past Surgical History:  Procedure Laterality Date   ABDOMINAL AORTOGRAM W/LOWER EXTREMITY Bilateral 07/13/2019   Procedure: ABDOMINAL AORTOGRAM W/LOWER EXTREMITY;  Surgeon: Maeola Harman, MD;  Location: [facility] INVASIVE CV LAB;  Service: Cardiovascular;  Laterality: Bilateral;   ABDOMINAL AORTOGRAM W/LOWER EXTREMITY N/A 01/08/2022   Procedure: ABDOMINAL AORTOGRAM W/LOWER EXTREMITY;  Surgeon: Victorino Sparrow, MD;  Location: [facility] INVASIVE CV LAB;  Service: Vascular;  Laterality: N/A;   BREAST EXCISIONAL BIOPSY Left    LESS THAN 5 YEARS AGO   COLONOSCOPY     ENDARTERECTOMY FEMORAL Right 02/20/2022   Procedure: RIGHT COMMON FEMORAL ENDARTERECTOMY WITH VEIN PATCH;  Surgeon: Maeola Harman, MD;  Location: [facility] OR;  Service: Vascular;  Laterality: Right;   PERIPHERAL VASCULAR INTERVENTION Left 07/13/2019   Procedure: PERIPHERAL VASCULAR INTERVENTION;  Surgeon: Maeola Harman, MD;  Location: [facility] INVASIVE CV LAB;  Service: Cardiovascular;  Laterality: Left;  common iliac    ROS: Review of  Systems Negative except as stated above  PHYSICAL EXAM: BP (!) 210/100   Pulse 72   Temp 98.3 F (36.8 C) (Oral)   Wt 139 lb (63 kg)   SpO2 100%   BMI 23.86 kg/m   Physical Exam  General appearance - alert, well appearing, and in no distress Mental status - normal mood, behavior, speech, dress, motor activity, and thought processes Neck - supple, no significant adenopathy Chest - clear to auscultation, no wheezes, rales or rhonchi, symmetric air entry Heart -regular rate and rhythm, soft systolic ejection murmur heard along the left sternal border. Neurological - cranial nerves II through XII intact, normal muscle tone, no tremors, strength 5/5.  Gross sensation is intact.  Gait is normal. Extremities -no lower extremity edema.      Latest Ref Rng & Units 06/13/2022   10:00 AM 02/21/2022    5:55 AM 02/20/2022    7:30 PM  CMP  Glucose 70 - 99 mg/dL 87  95    BUN 8 - 23 mg/dL 11  14    Creatinine 1.61 - 1.00 mg/dL 0.96  0.45  4.09   Sodium 135 - 145 mmol/L 136  134    Potassium 3.5 - 5.1 mmol/L 4.1  3.8    Chloride 98 - 111 mmol/L 97  103    CO2 22 - 32 mmol/L 30  23    Calcium 8.9 - 10.3 mg/dL 9.5  8.4    Total Protein 6.5 - 8.1 g/dL 7.9     Total Bilirubin 0.3 - 1.2 mg/dL 0.6     Alkaline Phos 38 - 126 U/L 73     AST 15 - 41 U/L 22     ALT 0 - 44 U/L 13      Lipid Panel     Component Value Date/Time   CHOL 144 02/21/2022 0555   CHOL 173 03/02/2021 1523   TRIG 81 02/21/2022 0555   HDL 41 02/21/2022 0555   HDL 58 03/02/2021 1523   CHOLHDL 3.5 02/21/2022 0555   VLDL 16 02/21/2022 0555   LDLCALC 87 02/21/2022 0555  LDLCALC 96 03/02/2021 1523    CBC    Component Value Date/Time   WBC 4.7 06/13/2022 1000   RBC 4.41 06/13/2022 1000   HGB 13.6 06/13/2022 1000   HGB 13.8 02/08/2022 1211   HCT 42.4 06/13/2022 1000   HCT 40.6 02/08/2022 1211   PLT 201 06/13/2022 1000   PLT 209 02/08/2022 1211   MCV 96.1 06/13/2022 1000   MCV 95 02/08/2022 1211   MCH 30.8  06/13/2022 1000   MCHC 32.1 06/13/2022 1000   RDW 14.5 06/13/2022 1000   RDW 13.0 02/08/2022 1211   LYMPHSABS 2.3 03/02/2021 1523   EOSABS 0.1 03/02/2021 1523   BASOSABS 0.0 03/02/2021 1523    ASSESSMENT AND PLAN:  1. Essential hypertension Not at goal.  She has not taken her medicine in 2 days.  Discussed the importance of good blood pressure control to prevent acute cardiovascular events like heart attack and stroke and also to help preserve kidney function.  Advised that she takes her medicines as soon as she returns home.  Will have her follow-up with nurse in 1-2 weeks for repeat blood pressure check  2. PAD (peripheral artery disease) (HCC) Encourage compliance with atorvastatin and aspirin.  Strongly encourage smoking cessation.  3. Migraine with aura and without status migrainosus, not intractable Migraine versus cluster headaches.  I think the former is more likely.  Uncontrolled blood pressure can also contribute.  Strongly encouraged her to take her blood pressure medicines as prescribed. Given her age, will get imaging study of the brain.  Try with low-dose Topamax at bedtime to decrease frequency.  Since she gets relief with Tylenol, we will have her continue that for now as abortive treatment. - topiramate (TOPAMAX) 25 MG tablet; Take 1 tablet (25 mg total) by mouth at bedtime.  Dispense: 30 tablet; Refill: 2 - MR Brain Wo Contrast; Future - Ambulatory referral to Neurology  4. CKD stage 3a, GFR 45-59 ml/min (HCC) Stable.  Discussed the importance of good blood pressure control.  Avoid NSAIDs.  5. Tobacco dependence Strongly advised to quit smoking.  6. Kidney lesion, native, left Will get her back in with urology for follow-up. - Ambulatory referral to Urology    Patient was given the opportunity to ask questions.  Patient verbalized understanding of the plan and was able to repeat key elements of the plan.   This documentation was completed using Social research officer, government.  Any transcriptional errors are unintentional.  Orders Placed This Encounter  Procedures   MR Brain Wo Contrast   Ambulatory referral to Neurology   Ambulatory referral to Urology     Requested Prescriptions   Signed Prescriptions Disp Refills   topiramate (TOPAMAX) 25 MG tablet 30 tablet 2    Sig: Take 1 tablet (25 mg total) by mouth at bedtime.    Return in about 4 months (around 10/15/2022) for Appt with [facility] in 2 wks for BP check.  Jonah Blue, MD, FACP

## 2022-06-15 ENCOUNTER — Ambulatory Visit: Payer: Medicaid Other | Admitting: Urology

## 2022-06-15 ENCOUNTER — Telehealth: Payer: Self-pay

## 2022-06-15 ENCOUNTER — Encounter: Payer: Self-pay | Admitting: Urology

## 2022-06-15 ENCOUNTER — Telehealth (HOSPITAL_BASED_OUTPATIENT_CLINIC_OR_DEPARTMENT_OTHER): Payer: Self-pay

## 2022-06-15 VITALS — BP 162/116 | HR 71 | Ht 64.0 in | Wt 140.0 lb

## 2022-06-15 DIAGNOSIS — N261 Atrophy of kidney (terminal): Secondary | ICD-10-CM

## 2022-06-15 DIAGNOSIS — N2889 Other specified disorders of kidney and ureter: Secondary | ICD-10-CM

## 2022-06-15 LAB — URINALYSIS, ROUTINE W REFLEX MICROSCOPIC
Bilirubin, UA: NEGATIVE
Glucose, UA: NEGATIVE
Ketones, UA: NEGATIVE
Leukocytes,UA: NEGATIVE
Nitrite, UA: NEGATIVE
Protein,UA: NEGATIVE
Specific Gravity, UA: 1.02 (ref 1.005–1.030)
Urobilinogen, Ur: 0.2 mg/dL (ref 0.2–1.0)
pH, UA: 6 (ref 5.0–7.5)

## 2022-06-15 LAB — MICROSCOPIC EXAMINATION
Cast Type: NONE SEEN
Casts: NONE SEEN /lpf
Crystal Type: NONE SEEN
Crystals: NONE SEEN
Epithelial Cells (non renal): NONE SEEN /hpf (ref 0–10)
Mucus, UA: NONE SEEN
Renal Epithel, UA: NONE SEEN /hpf
Trichomonas, UA: NONE SEEN
WBC, UA: NONE SEEN /hpf (ref 0–5)
Yeast, UA: NONE SEEN

## 2022-06-15 NOTE — Progress Notes (Signed)
Assessment: 1. Renal mass; left - non-enhancing, stable in size x 18 months   2. Atrophic kidney, right    Plan: Recommend continued surveillance of the left renal mass with follow-up MRI imaging  Will contact her with results   Chief Complaint:  Chief Complaint  Patient presents with   Renal mass    History of Present Illness:  Brooke Chapman is a 62 y.o. female who is seen for further evaluation of possible left renal mass. She was recently evaluated with a CT chest without contrast on 09/30/2021.  This showed a exophytic isodense 1.7 cm upper left renal cortical lesion which was stable in comparison to a study from 5/22, a simple 1.4 cm posterior left renal cyst, and asymmetric right renal atrophy.  She was evaluated further with a MRI of the abdomen with and without contrast on 11/04/2021.  This showed a round non-enhancing 2.0 x 1.3 cm mass closely opposed to the spleen, superior pole of left kidney and left adrenal gland, marked atrophy of the right kidney, simple benign bilateral renal cortical cyst.  She returns today for follow-up.  She is not having any flank or abdominal pain.  No dysuria or gross hematuria.  Portions of the above documentation were copied from a prior visit for review purposes only.   Past Medical History:  Past Medical History:  Diagnosis Date   Anemia    Antiplatelet or antithrombotic long-term use 01/13/2020   Aortic stenosis 07/05/2021   mild AS by echo   Black stools 01/13/2020   CKD (chronic kidney disease)    CKD 3b with atrophic right kidney and left renal lesion followed by urology (02/19/22)   Frontal balding 12/28/2019   GAD (generalized anxiety disorder) 05/01/2020   HTN (hypertension) 05/01/2018   Hyperlipidemia    Iron deficiency anemia 12/28/2019   Mediastinal mass 09/29/2021   followed by CT surgeon Dr. Dorris Fetch, patient opted for surveillance imaging (12/12/21)   PAD (peripheral artery disease) (HCC)    Porokeratosis  08/26/2018   Pre-diabetes    PVD (peripheral vascular disease) (HCC) 08/26/2018   Skin lesion 05/26/2021   Tobacco dependence 05/01/2018   Unintended weight loss 01/13/2020    Past Surgical History:  Past Surgical History:  Procedure Laterality Date   ABDOMINAL AORTOGRAM W/LOWER EXTREMITY Bilateral 07/13/2019   Procedure: ABDOMINAL AORTOGRAM W/LOWER EXTREMITY;  Surgeon: Maeola Harman, MD;  Location: [facility] INVASIVE CV LAB;  Service: Cardiovascular;  Laterality: Bilateral;   ABDOMINAL AORTOGRAM W/LOWER EXTREMITY N/A 01/08/2022   Procedure: ABDOMINAL AORTOGRAM W/LOWER EXTREMITY;  Surgeon: Victorino Sparrow, MD;  Location: [facility] INVASIVE CV LAB;  Service: Vascular;  Laterality: N/A;   BREAST EXCISIONAL BIOPSY Left    LESS THAN 5 YEARS AGO   COLONOSCOPY     ENDARTERECTOMY FEMORAL Right 02/20/2022   Procedure: RIGHT COMMON FEMORAL ENDARTERECTOMY WITH VEIN PATCH;  Surgeon: Maeola Harman, MD;  Location[facility] OR;  Service: Vascular;  Laterality: Right;   PERIPHERAL VASCULAR INTERVENTION Left 07/13/2019   Procedure: PERIPHERAL VASCULAR INTERVENTION;  Surgeon: Maeola Harman, MD;  Location: [facility] INVASIVE CV LAB;  Service: Cardiovascular;  Laterality: Left;  common iliac    Allergies:  Allergies  Allergen Reactions   Latex Rash    Family History:  Family History  Problem Relation Age of Onset   Heart disease Mother    Hypertension Mother    Hyperlipidemia Mother    Hypertension Brother    Heart disease Maternal Aunt    Hypertension Maternal Aunt  Hyperlipidemia Maternal Aunt    Colon cancer Neg Hx    Liver cancer Neg Hx    Rectal cancer Neg Hx    Stomach cancer Neg Hx     Social History:  Social History   Tobacco Use   Smoking status: Every Day    Packs/day: .5    Types: Cigarettes   Smokeless tobacco: Never   Tobacco comments:    5 cigs daily  Vaping Use   Vaping Use: Never used  Substance Use Topics   Alcohol use: Not Currently     Alcohol/week: 2.0 standard drinks of alcohol    Types: 2 Standard drinks or equivalent per week   Drug use: Never    ROS: Constitutional:  Negative for fever, chills, weight loss CV: Negative for chest pain, previous MI, hypertension Respiratory:  Negative for shortness of breath, wheezing, sleep apnea, frequent cough GI:  Negative for nausea, vomiting, bloody stool, GERD  Physical exam: BP (!) 162/116   Pulse 71   Ht 5\' 4"  (1.626 m)   Wt 140 lb (63.5 kg)   BMI 24.03 kg/m  GENERAL APPEARANCE:  Well appearing, well developed, well nourished, NAD HEENT:  Atraumatic, normocephalic, oropharynx clear NECK:  Supple without lymphadenopathy or thyromegaly ABDOMEN:  Soft, non-tender, no masses EXTREMITIES:  Moves all extremities well, without clubbing, cyanosis, or edema NEUROLOGIC:  Alert and oriented x 3, normal gait, CN II-XII grossly intact MENTAL STATUS:  appropriate BACK:  Non-tender to palpation, No CVAT SKIN:  Warm, dry, and intact  Results: U/A: 0-2 RBC, few bacteria

## 2022-06-15 NOTE — Telephone Encounter (Signed)
Spoke with patient regarding concerns of uncontrolled hypertension. Per Dr. Randie Heinz, cancel left femoral endarterectomy surgery, follow up with PCP for BP control and once BP is optimized, we can reschedule surgery. Emphasized the importance of taking medications regularly as prescribed , monitor readings with BP monitor and keep a log. She verbalized understanding to all.

## 2022-06-19 ENCOUNTER — Encounter (HOSPITAL_COMMUNITY): Admission: RE | Payer: Self-pay | Source: Home / Self Care

## 2022-06-19 ENCOUNTER — Inpatient Hospital Stay (HOSPITAL_COMMUNITY): Admission: RE | Admit: 2022-06-19 | Payer: Medicaid Other | Source: Home / Self Care | Admitting: Vascular Surgery

## 2022-06-19 SURGERY — ENDARTERECTOMY, FEMORAL
Anesthesia: General | Laterality: Left

## 2022-06-29 ENCOUNTER — Encounter: Payer: Self-pay | Admitting: Neurology

## 2022-07-10 NOTE — Telephone Encounter (Signed)
Contacted patient to follow up on BP control and appt with PCP not showing completed. Patient reports she had been putting off scheduling PCP appt, but will contact their office today. Reports she has been following her medication regimen and BP is has been better such as 118/88, 112/90. Advised patient to take her BP log with her to PCP appt, so this information can be reviewed.

## 2022-07-13 ENCOUNTER — Ambulatory Visit: Payer: Medicaid Other | Attending: Internal Medicine | Admitting: Pharmacist

## 2022-07-13 ENCOUNTER — Encounter: Payer: Self-pay | Admitting: Pharmacist

## 2022-07-13 VITALS — BP 133/86 | HR 65

## 2022-07-13 DIAGNOSIS — I10 Essential (primary) hypertension: Secondary | ICD-10-CM

## 2022-07-13 NOTE — Progress Notes (Signed)
   S:    PCP: Dr. Laural Benes  Patient arrives in good spirits. Presents to the clinic for hypertension , counseling, and management. Patient was referred and last seen by Dr. Laural Benes 06/14/2022. BP was elevated at that visit secondary to poor adherence.   Today, pt arrives in good spirits. Admits that she struggles with adherence overall but has been taking her medications regularly since her appt with Dr. Laural Benes. She took all of her antihypertensives prior to this appointment. Admits that she struggles with adherence and does not like taking BP medications daily.   Denies chest pain, dyspnea, HA, blurred vision.   Current BP Medications include:  amlodipine 10 mg daily, carvedilol 25 mg BID, valsartan-HCTZ 320-25 mg daily  Dietary habits include: somewhat compliant with salt restriction. Does get the occasional craving for a salted snack, names chips. Drinks coffee in the mornings, specifically two 16oz cups. Exercise habits include: none  Family / Social history:  Fhx: HLD, HTN, heart disease  Tobacco: 0.25 PPD smoker  Alcohol: 1 drink standard per today   O:  Brings log for review:  Since last visit, Bps range from:  SBP: 102-134 DBP: 72-94  Vitals:   07/13/22 0929  BP: 133/86  Pulse: 65   Last 3 Office BP readings: BP Readings from Last 3 Encounters:  07/13/22 133/86  06/15/22 (!) 162/116  06/14/22 (!) 210/100   BMET    Component Value Date/Time   NA 136 06/13/2022 1000   NA 136 02/08/2022 1211   K 4.1 06/13/2022 1000   CL 97 (L) 06/13/2022 1000   CO2 30 06/13/2022 1000   GLUCOSE 87 06/13/2022 1000   BUN 11 06/13/2022 1000   BUN 19 02/08/2022 1211   CREATININE 1.23 (H) 06/13/2022 1000   CALCIUM 9.5 06/13/2022 1000   GFRNONAA 49 (L) 06/13/2022 1000   GFRAA 79 05/11/2019 1534    Renal function: CrCl cannot be calculated (Patient's most recent lab result is older than the maximum 21 days allowed.).  Clinical ASCVD: PAD   A/P: Hypertension diagnosed currently  close to goal with improved medication adherence. BP Goal = < 130/80 mmHg. Medication adherence reported but has been an issue for her in the past. I strongly encouraged adherence. -Continued current medication regimen given improvement.  -Counseled on lifestyle modifications for blood pressure control including reduced dietary sodium, increased exercise, adequate sleep. -No labs today.   Results reviewed and written information provided.   Total time in face-to-face counseling 30 minutes.   F/U Clinic Visit in Sept with PCP.  Butch Penny, PharmD, P[place]y [place], CPP Clinical Pharmacist [facility] [phone]

## 2022-07-23 ENCOUNTER — Other Ambulatory Visit: Payer: Self-pay

## 2022-07-23 DIAGNOSIS — I70222 Atherosclerosis of native arteries of extremities with rest pain, left leg: Secondary | ICD-10-CM

## 2022-07-23 NOTE — Telephone Encounter (Signed)
Patient seen by PCP on 6/14 and blood pressure stable. Rescheduled surgery for 7/12. Instructions reviewed and patient voiced understanding.

## 2022-07-30 ENCOUNTER — Encounter: Payer: Self-pay | Admitting: Internal Medicine

## 2022-07-30 NOTE — Progress Notes (Signed)
Surgical Instructions   Your procedure is scheduled on Friday, June 12 at 9:37 AM.  Report to [facility] Main Entrance "A" at 7:35 AM, then check in with the Admitting office.  Call this number if you have problems the morning of surgery:  [phone]   If you have any questions prior to your surgery date call [phone]: Open Monday-Friday 8am-4pm If you experience any cold or flu symptoms such as cough, fever, chills, shortness of breath, etc. between now and your scheduled surgery, please notify us at the above number    Remember:  Do not eat or drink after midnight the night before your surgery.    Take these medicines the morning of surgery with A SIP OF WATER: Amlodipine (Norvasc), Aspirin, Atorvastatin (Lipitor), Carvedilol (Coreg), Cilostazol (Pletal), Nitroglycerin - if needed  As of today, STOP taking any Aspirin (unless otherwise instructed by your surgeon) Aleve, Naproxen, Ibuprofen, Motrin, Advil, Goody's, BC's, all herbal medications, fish oil, and all vitamins.        Do not wear jewelry or makeup. Do not wear lotions, powders, perfumes or deodorant. Do not shave 48 hours prior to surgery.  Do not bring valuables to the hospital. Do not wear nail polish, gel polish, artificial nails, or any other type of covering on natural nails (fingers and toes) If you have artificial nails or gel coating that need to be removed by a nail salon, please have this removed prior to surgery. Artificial nails or gel coating may interfere with anesthesia's ability to adequately monitor your vital signs.  [facility] is not responsible for any belongings or valuables.    Do NOT Smoke (Tobacco/Vaping)  24 hours prior to your procedure   Contacts, glasses, hearing aids, dentures or partials may not be worn into surgery, please bring cases for these belongings   For patients admitted to the hospital, discharge time will be determined by your treatment team.  SURGICAL WAITING ROOM  VISITATION Patients having surgery or a procedure may have no more than 2 support people in the waiting area - these visitors may rotate.   Children under the age of 63 must have an adult with them who is not the patient. If the patient needs to stay at the hospital during part of their recovery, the visitor guidelines for inpatient rooms apply. Pre-op nurse will coordinate an appropriate time for 1 support person to accompany patient in pre-op.  This support person may not rotate.   Please refer to [email or web address] for the visitor guidelines for Inpatients (after your surgery is over and you are in a regular room).   Special instructions:    Oral Hygiene is also important to reduce your risk of infection.  Remember - BRUSH YOUR TEETH THE MORNING OF SURGERY WITH YOUR REGULAR TOOTHPASTE  [facility]- Preparing For Surgery  Before surgery, you can play an important role. Because skin is not sterile, your skin needs to be as free of germs as possible. You can reduce the number of germs on your skin by washing with CHG (chlorahexidine gluconate) Soap before surgery.  CHG is an antiseptic cleaner which kills germs and bonds with the skin to continue killing germs even after washing.    Please do not use if you have an allergy to CHG or antibacterial soaps. If your skin becomes reddened/irritated stop using the CHG.  Do not shave (including legs and underarms) for at least 48 hours prior to first CHG shower. It is OK to shave your face.  Please follow these instructions carefully.    Shower the NIGHT BEFORE SURGERY and the MORNING OF SURGERY with CHG Soap.   If you chose to wash your hair, wash your hair first as usual with your normal shampoo. After you shampoo, rinse your hair and body thoroughly to remove the shampoo.  Then [employer] and genitals (private parts) with your normal soap and rinse thoroughly to remove soap.  After that Use CHG  Soap as you would any other liquid soap. You can apply CHG directly to the skin and wash gently with a scrungie or a clean washcloth.   Apply the CHG Soap to your body ONLY FROM THE NECK DOWN.  Do not use on open wounds or open sores. Avoid contact with your eyes, ears, mouth and genitals (private parts). Wash Face and genitals (private parts)  with your normal soap.   Wash thoroughly, paying special attention to the area where your surgery will be performed.  Thoroughly rinse your body with warm water from the neck down.  DO NOT shower/wash with your normal soap after using and rinsing off the CHG Soap.  Pat yourself dry with a CLEAN TOWEL.  Wear CLEAN PAJAMAS to bed the night before surgery  Place CLEAN SHEETS on your bed the night before your surgery  DO NOT SLEEP WITH PETS.  Day of Surgery: Take a shower with CHG soap. Wear Clean/Comfortable clothing the morning of surgery Do not apply any deodorants/lotions.   Remember to brush your teeth WITH YOUR REGULAR TOOTHPASTE.  If you received a COVID test during your pre-op visit, it is requested that you wear a mask when out in public, stay away from anyone that may not be feeling well, and notify your surgeon if you develop symptoms. If you have been in contact with anyone that has tested positive in the last 10 days, please notify your surgeon.    Please read over the fact sheets that you were given.

## 2022-07-31 ENCOUNTER — Encounter (HOSPITAL_COMMUNITY)
Admission: RE | Admit: 2022-07-31 | Discharge: 2022-07-31 | Disposition: A | Discharge status: Home / Self Care | Payer: Medicaid Other | Source: Ambulatory Visit | Attending: Vascular Surgery | Admitting: Vascular Surgery

## 2022-07-31 ENCOUNTER — Encounter (HOSPITAL_COMMUNITY): Payer: Self-pay

## 2022-07-31 ENCOUNTER — Other Ambulatory Visit: Payer: Self-pay

## 2022-07-31 VITALS — BP 138/93 | HR 67 | Temp 98.1°F | Resp 17 | Ht 64.0 in | Wt 139.0 lb

## 2022-07-31 DIAGNOSIS — Z01812 Encounter for preprocedural laboratory examination: Secondary | ICD-10-CM | POA: Diagnosis not present

## 2022-07-31 DIAGNOSIS — I70222 Atherosclerosis of native arteries of extremities with rest pain, left leg: Secondary | ICD-10-CM | POA: Insufficient documentation

## 2022-07-31 DIAGNOSIS — Z01818 Encounter for other preprocedural examination: Secondary | ICD-10-CM

## 2022-07-31 HISTORY — DX: Headache, unspecified: R51.9

## 2022-07-31 LAB — COMPREHENSIVE METABOLIC PANEL
ALT: 14 U/L (ref 0–44)
AST: 25 U/L (ref 15–41)
Albumin: 3.6 g/dL (ref 3.5–5.0)
Alkaline Phosphatase: 58 U/L (ref 38–126)
Anion gap: 8 (ref 5–15)
BUN: 15 mg/dL (ref 8–23)
CO2: 28 mmol/L (ref 22–32)
Calcium: 9.3 mg/dL (ref 8.9–10.3)
Chloride: 100 mmol/L (ref 98–111)
Creatinine, Ser: 1.23 mg/dL — ABNORMAL HIGH (ref 0.44–1.00)
GFR, Estimated: 49 mL/min — ABNORMAL LOW (ref >60)
Glucose, Bld: 87 mg/dL (ref 70–99)
Potassium: 3.8 mmol/L (ref 3.5–5.1)
Sodium: 136 mmol/L (ref 135–145)
Total Bilirubin: 0.7 mg/dL (ref 0.3–1.2)
Total Protein: 7.9 g/dL (ref 6.5–8.1)

## 2022-07-31 LAB — CBC
HCT: 40.6 % (ref 36.0–46.0)
Hemoglobin: 13.4 g/dL (ref 12.0–15.0)
MCH: 32.2 pg (ref 26.0–34.0)
MCHC: 33 g/dL (ref 30.0–36.0)
MCV: 97.6 fL (ref 80.0–100.0)
Platelets: 187 10*3/uL (ref 150–400)
RBC: 4.16 MIL/uL (ref 3.87–5.11)
RDW: 14.6 % (ref 11.5–15.5)
WBC: 5 10*3/uL (ref 4.0–10.5)
nRBC: 0 % (ref 0.0–0.2)

## 2022-07-31 LAB — PROTIME-INR
INR: 1 (ref 0.8–1.2)
Prothrombin Time: 13.8 seconds (ref 11.4–15.2)

## 2022-07-31 LAB — URINALYSIS, ROUTINE W REFLEX MICROSCOPIC
Bilirubin Urine: NEGATIVE
Glucose, UA: NEGATIVE mg/dL
Hgb urine dipstick: NEGATIVE
Ketones, ur: NEGATIVE mg/dL
Leukocytes,Ua: NEGATIVE
Nitrite: NEGATIVE
Protein, ur: NEGATIVE mg/dL
Specific Gravity, Urine: 1.01 (ref 1.005–1.030)
pH: 5 (ref 5.0–8.0)

## 2022-07-31 LAB — TYPE AND SCREEN
ABO/RH(D): B POS
Antibody Screen: NEGATIVE

## 2022-07-31 LAB — SURGICAL PCR SCREEN
MRSA, PCR: NEGATIVE
Staphylococcus aureus: NEGATIVE

## 2022-07-31 LAB — APTT: aPTT: 30 seconds (ref 24–36)

## 2022-07-31 NOTE — Progress Notes (Signed)
PCP - Marcine Matar, MD Cardiologist - Denies  PPM/ICD - Denies  Chest x-ray - Denies EKG - 06/13/2022 Stress Test - 06/23/2021 ECHO - 07/05/2021 Cardiac Cath - Denies  Sleep Study - Denies  DM: Prediabetic   Blood Thinner Instructions: N/A Aspirin Instructions: Call surgeons office for instructions on aspirin.  ERAS Protcol - NPO PRE-SURGERY Ensure or G2- N/A  COVID TEST- No   Anesthesia review: Yes, cardiac history.  Patient denies shortness of breath, fever, cough and chest pain at PAT appointment   All instructions explained to the patient, with a verbal understanding of the material. Patient agrees to go over the instructions while at home for a better understanding.The opportunity to ask questions was provided.

## 2022-07-31 NOTE — Progress Notes (Addendum)
Surgical Instructions   Your procedure is scheduled on Friday, June 12 at 9:35 AM.  Report to [facility] "A" at 7:35 AM, then check in with the Admitting office.  Call this number if you have problems the morning of surgery:  [phone]   If you have any questions prior to your surgery date call [phone]: Open Monday-Friday 8am-4pm If you experience any cold or flu symptoms such as cough, fever, chills, shortness of breath, etc. between now and your scheduled surgery, please notify us at the above number    Remember:  Do not eat or drink after midnight the night before your surgery.    Take these medicines the morning of surgery with A SIP OF WATER:  amLODipine (NORVASC)  aspirin  atorvastatin (LIPITOR)  carvedilol (COREG)  cilostazol (PLETAL)   If needed:  nitroGLYCERIN (NITROSTAT   As of today, STOP taking any (unless otherwise instructed by your surgeon) Aleve, Naproxen, Ibuprofen, Motrin, Advil, Goody's, BC's, all herbal medications, fish oil, and all vitamins.   Special instructions:    Oral Hygiene is also important to reduce your risk of infection.  Remember - BRUSH YOUR TEETH THE MORNING OF SURGERY WITH YOUR REGULAR TOOTHPASTE  [place]- Preparing For Surgery  Before surgery, you can play an important role. Because skin is not sterile, your skin needs to be as free of germs as possible. You can reduce the number of germs on your skin by washing with CHG (chlorahexidine gluconate) Soap before surgery.  CHG is an antiseptic cleaner which kills germs and bonds with the skin to continue killing germs even after washing.    Please do not use if you have an allergy to CHG or antibacterial soaps. If your skin becomes reddened/irritated stop using the CHG.  Do not shave (including legs and underarms) for at least 48 hours prior to first CHG shower. It is OK to shave your face.  Please follow these instructions carefully.    Shower the NIGHT BEFORE SURGERY  and the MORNING OF SURGERY with CHG Soap.   If you chose to wash your hair, wash your hair first as usual with your normal shampoo. After you shampoo, rinse your hair and body thoroughly to remove the shampoo.  Then [employer] and genitals (private parts) with your normal soap and rinse thoroughly to remove soap.  After that Use CHG Soap as you would any other liquid soap. You can apply CHG directly to the skin and wash gently with a scrungie or a clean washcloth.   Apply the CHG Soap to your body ONLY FROM THE NECK DOWN.  Do not use on open wounds or open sores. Avoid contact with your eyes, ears, mouth and genitals (private parts). Wash Face and genitals (private parts)  with your normal soap.   Wash thoroughly, paying special attention to the area where your surgery will be performed.  Thoroughly rinse your body with warm water from the neck down.  DO NOT shower/wash with your normal soap after using and rinsing off the CHG Soap.  Pat yourself dry with a CLEAN TOWEL.  Wear CLEAN PAJAMAS to bed the night before surgery  Place CLEAN SHEETS on your bed the night before your surgery  DO NOT SLEEP WITH PETS.  Day of Surgery: Take a shower with CHG soap. Wear Clean/Comfortable clothing the morning of surgery Do not apply any deodorants/lotions.   Remember to brush your teeth WITH YOUR REGULAR TOOTHPASTE.  Do not wear jewelry or makeup.  Do not wear lotions, powders, perfumes or deodorant. Do not shave 48 hours prior to surgery.  Do not bring valuables to the hospital. Do not wear nail polish, gel polish, artificial nails, or any other type of covering on natural nails (fingers and toes) If you have artificial nails or gel coating that need to be removed by a nail salon, please have this removed prior to surgery. Artificial nails or gel coating may interfere with anesthesia's ability to adequately monitor your vital signs.  [facility] is not responsible for any belongings or valuables.     Do NOT Smoke (Tobacco/Vaping)  24 hours prior to your procedure   Contacts, glasses, hearing aids, dentures or partials may not be worn into surgery, please bring cases for these belongings   For patients admitted to the hospital, discharge time will be determined by your treatment team.  SURGICAL WAITING ROOM VISITATION Patients having surgery or a procedure may have no more than 2 support people in the waiting area - these visitors may rotate.   Children under the age of 76 must have an adult with them who is not the patient. If the patient needs to stay at the hospital during part of their recovery, the visitor guidelines for inpatient rooms apply. Pre-op nurse will coordinate an appropriate time for 1 support person to accompany patient in pre-op.  This support person may not rotate.   Please refer to [email or web address] for the visitor guidelines for Inpatients (after your surgery is over and you are in a regular room).  If you received a COVID test during your pre-op visit, it is requested that you wear a mask when out in public, stay away from anyone that may not be feeling well, and notify your surgeon if you develop symptoms. If you have been in contact with anyone that has tested positive in the last 10 days, please notify your surgeon.    Please read over the fact sheets that you were given.

## 2022-08-01 NOTE — Anesthesia Preprocedure Evaluation (Addendum)
Anesthesia Evaluation  Patient identified by MRN, date of birth, ID band Patient awake    Reviewed: Allergy & Precautions, NPO status , Patient's Chart, lab work & pertinent test results, reviewed documented beta blocker date and time   History of Anesthesia Complications Negative for: history of anesthetic complications  Airway Mallampati: II  TM Distance: >3 FB Neck ROM: Full    Dental  (+) Edentulous Lower, Edentulous Upper   Pulmonary Current Smoker and Patient abstained from smoking.   Pulmonary exam normal        Cardiovascular hypertension, Pt. on medications and Pt. on home beta blockers + CAD and + Peripheral Vascular Disease  Normal cardiovascular exam   '23 TTE - EF 55 %. There is mild asymmetric left ventricular hypertrophy of the basal-septal segment. Grade I diastolic dysfunction (impaired relaxation). Mild mitral valve regurgitation. Mild aortic valve stenosis. Aortic valve area, by VTI measures 1.20  cm. Aortic valve mean gradient measures 13.0 mmHg. Aortic valve Vmax measures 2.51 m/s.     Neuro/Psych  Headaches PSYCHIATRIC DISORDERS Anxiety        GI/Hepatic negative GI ROS, Neg liver ROS,,,  Endo/Other   Pre-DM   Renal/GU CRFRenal disease     Musculoskeletal negative musculoskeletal ROS (+)    Abdominal   Peds  Hematology negative hematology ROS (+)   Anesthesia Other Findings Lupus  Reproductive/Obstetrics                             Anesthesia Physical Anesthesia Plan  ASA: 3  Anesthesia Plan: General   Post-op Pain Management: Tylenol PO (pre-op)*   Induction: Intravenous  PONV Risk Score and Plan: 2 and Treatment may vary due to age or medical condition, Ondansetron and Dexamethasone  Airway Management Planned: Oral ETT  Additional Equipment: Arterial line  Intra-op Plan:   Post-operative Plan: Extubation in OR  Informed Consent: I have reviewed  the patients History and Physical, chart, labs and discussed the procedure including the risks, benefits and alternatives for the proposed anesthesia with the patient or authorized representative who has indicated his/her understanding and acceptance.     Dental advisory given  Plan Discussed with: CRNA and Anesthesiologist  Anesthesia Plan Comments: (See PAT note )       Anesthesia Quick Evaluation

## 2022-08-01 NOTE — Progress Notes (Signed)
Anesthesia APP Follow-up:  Case: [id number] Date/Time: 08/10/22 1914   Procedure: LEFT COMMON FEMORAL ENDARTERECTOMY (Left)   Anesthesia type: General   Pre-op diagnosis: Critical limb ischemia of left lower extremity with rest pain   [facility]: [facility] ROOM 16 / [facility]   Surgeons: Maeola Harman, MD       DISCUSSION:  Patient is a 62 year old female scheduled for the above procedure. Her RLE rest pain improved following right femoral endarterectomy on 02/20/22. Above procedure recommended now for LLE rest pain with toe pressure of 0. This surgery was intimally scheduled for 06/19/22, but delayed to get her BP better controlled. Since then she has had primary care [facility] follow-up for HTN and titration of medications as indicated. As of 07/13/22 BP improved to 133/86 (had been up to 210/117). Anti-hypertensives include amlodipine 10 mill grams daily, Coreg 25 mg twice daily, losartan-HCTZ 320-25 mg daily.   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   Last visit with cardiologist Dr. Tomie China was on 06/15/21 for episode of chest pain when BP elevated. Also with murmur on exam. Echo in June 2023 showed normal LVEF, no regional wall motion abnormalities, mild asymmetric LV hypertrophy of the basal septal segment, grade 1 diastolic dysfunction, normal RV systolic function, normal PASP, mild MR, mild AS. She had a non-ischemic stress test on 06/23/21. Follow-up in ~ 6 months had been planned. She denied any new or progressive symptoms since then.    HTN control has improved overall. Anesthesia team to evaluate on the day of surgery. Per previous posting in May, she was to continue  ASA and Pletal.   VS:  Wt Readings from Last 3 Encounters:  07/31/22 63 kg  06/15/22 63.5 kg  06/14/22 63 kg   BP Readings from Last 3 Encounters:  07/31/22 (!) 138/93  07/13/22 133/86  06/15/22 (!) 162/116   Pulse Readings from Last 3 Encounters:  07/31/22 67  07/13/22 65  06/15/22 71    PROVIDERS: Marcine Matar, MD is PCP [facility]) - Revankar, Glean Hess, MD is cardiologist - Lemar Livings, MD is vascular surgeon - Charlett Lango, MD is CT surgeon. 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 One year follow-up with chest CT planned.  Di Kindle, MD is urologist. He follows her for atrophic right kidney and left renal mass that had been stable for 18 months, and felt likely benign. Last visit 06/15/22 with continued MRI surveillance recommended.      LABS: 07/31/22 PAT Labs include: Lab Results  Component Value Date   WBC 5.0 07/31/2022   HGB 13.4 07/31/2022   HCT 40.6 07/31/2022   PLT 187 07/31/2022   GLUCOSE 87 07/31/2022   CHOL 144 02/21/2022   TRIG 81 02/21/2022   HDL 41 02/21/2022   LDLCALC 87 02/21/2022   ALT 14 07/31/2022   AST 25 07/31/2022   NA 136 07/31/2022   K 3.8 07/31/2022   CL 100 07/31/2022   CREATININE 1.23 (H) 07/31/2022   BUN 15 07/31/2022   CO2 28 07/31/2022   INR 1.0 07/31/2022   HGBA1C 5.7 (H) 11/10/2021    IMAGES:  MRI Abd 11/03/21: IMPRESSION: 1. Rounded, intermediate signal, nonenhancing lesion which is closely apposed to the medial spleen, superior pole of the left kidney, and left adrenal gland does not appear to be fluid in character and measures 2.0 x 1.3 cm. This is of uncertain nature although almost certainly benign given established stability and lack of contrast enhancement. 2. Small focus of arterial hyperenhancement of the liver dome, hepatic segment VIII,  measuring 1.1 x 0.8 cm, which isoenhancing on subsequent contrast enhanced phases and without underlying intrinsic signal abnormality. This may reflect a benign transient hepatic intensity difference or perhaps a small focal nodular hyperplasia, although almost certainly benign and of no significance in the absence of known chronic high risk liver disease. 3. Markedly atrophic right kidney consistent with prior infectious, obstructive, or ischemic insult.   CT Chest lung cancer screening 09/29/21: IMPRESSION: 1. Lung-RADS 2, benign appearance or behavior. Continue annual screening with low-dose chest CT without contrast in 12 months. 2. Indeterminate 3.2 x 1.6 cm soft tissue mass in the superior aspect of the posterior left mediastinum/medial left pleural space, stable from 06/09/2020 CT, differential includes neurogenic tumor versus solitary fibrous tumor of the pleura, as reported previously. 3. Indeterminate exophytic isodense 1.7 cm upper left renal cortical lesion, stable from 06/17/2020 CT, cannot exclude indolent renal neoplasm. MRI (preferred) or CT abdomen without and with IV contrast may be considered for further characterization at this time. 4. Three-vessel coronary atherosclerosis. 5. Aortic Atherosclerosis (ICD10-I70.0) and Emphysema (ICD10-J43.9).     EKG:  EKG 06/13/22: Normal sinus rhythm Possible Left atrial enlargement Septal infarct , age undetermined Abnormal ECG - Overall, I think her EKG is stable when compared to 06/15/21 tracing.   EKG 06/15/21 ([facility]): Normal sinus rhythm Possible left atrial enlargement Anteroseptal infarct, age undetermined     CV: Echo 07/05/21: IMPRESSIONS   1. Left ventricular ejection fraction by 3D volume is 55 %. The left  ventricle has normal function. The left ventricle has no regional wall  motion abnormalities. There is mild asymmetric left ventricular  hypertrophy of the basal-septal segment. Left  ventricular  diastolic parameters are consistent with Grade I diastolic  dysfunction (impaired relaxation).   2. Right ventricular systolic function is normal. The right ventricular  size is normal. There is normal pulmonary artery systolic pressure. The  estimated right ventricular systolic pressure is 26.8 mmHg.   3. The mitral valve is grossly normal. Mild mitral valve regurgitation.  No evidence of mitral stenosis.   4. The aortic valve is tricuspid. There is moderate calcification of the  aortic valve. There is moderate thickening of the aortic valve. Aortic  valve regurgitation is not visualized. Mild aortic valve stenosis. Aortic  valve area, by VTI measures 1.20  cm. Aortic valve mean gradient measures 13.0 mmHg. Aortic valve Vmax  measures 2.51 m/s.   5. The inferior vena cava is normal in size with greater than 50%  respiratory variability, suggesting right atrial pressure of 3 mmHg.      Nuclear stress test 06/23/21:   The study is normal. Findings are consistent with no prior ischemia and no prior myocardial infarction. The study is low risk.   No ST deviation was noted.   LV perfusion is normal. There is no evidence of ischemia. There is no evidence of infarction.   Left ventricular function is normal. Nuclear stress EF: 54 %. The left ventricular ejection fraction is mildly decreased (45-54%). End diastolic cavity size is normal. End systolic cavity size is normal.  Prior study not available for comparison.     Long term monitor 12/18/19 - 12/21/19: - A ZIO monitor was performed for 3 days beginning 12/18/2019 to assess palpitation. - Cardiac rhythm throughout was sinus with average, minimum of maximum heart rates of 83, 1620 bpm. - There were no pauses of 3 seconds or greater and no episodes of second or third-degree AV block or sinus node exit block. - Ventricular ectopy was rare with isolated PVCs and couplets. - Supraventricular ectopy was rare and there were no episodes of atrial  fibrillation or flutter. - There were 7 triggered events 2 associated with frequent PVCs and the others were an accelerated junctional rhythm at a rate of approximately 100 bpm. Conclusion: Although the frequency of ventricular ectopy is rare there is symptomatic PVCs, also brief accelerated junctional rhythm.    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 Dorris Fetch, patient opted for surveillance imaging (12/12/21)   PAD (peripheral artery disease) (HCC)    Porokeratosis 08/26/2018   Pre-diabetes    PVD (peripheral vascular disease) (HCC) 08/26/2018   Skin lesion 05/26/2021   Tobacco dependence 05/01/2018   Unintended weight loss 01/13/2020    Past Surgical History:  Procedure Laterality Date   ABDOMINAL AORTOGRAM W/LOWER EXTREMITY Bilateral 07/13/2019   Procedure: ABDOMINAL AORTOGRAM W/LOWER EXTREMITY;  Surgeon: Maeola Harman, MD;  Location: [facility] INVASIVE CV LAB;  Service: Cardiovascular;  Laterality: Bilateral;   ABDOMINAL AORTOGRAM W/LOWER EXTREMITY N/A 01/08/2022   Procedure: ABDOMINAL AORTOGRAM W/LOWER EXTREMITY;  Surgeon: Victorino Sparrow, MD;  Location: [facility] INVASIVE CV LAB;  Service: Vascular;  Laterality: N/A;   BREAST EXCISIONAL BIOPSY Left    LESS THAN 5 YEARS AGO   COLONOSCOPY     ENDARTERECTOMY FEMORAL Right 02/20/2022   Procedure: RIGHT COMMON FEMORAL ENDARTERECTOMY WITH VEIN PATCH;  Surgeon: Maeola Harman, MD;  Location: [facility] OR;  Service: Vascular;   Laterality: Right;   PERIPHERAL VASCULAR INTERVENTION Left 07/13/2019   Procedure: PERIPHERAL VASCULAR INTERVENTION;  Surgeon: Maeola Harman, MD;  Location: [facility] INVASIVE CV LAB;  Service: Cardiovascular;  Laterality: Left;  common iliac    MEDICATIONS:  cloNIDine (CATAPRES) tablet 0.1 mg    amLODipine (NORVASC) 10 MG tablet   aspirin 81 MG chewable tablet   atorvastatin (LIPITOR) 20 MG tablet   carvedilol (COREG) 25 MG tablet   cilostazol (PLETAL) 100 MG tablet   ferrous sulfate 325 (65 FE) MG tablet   Multiple Vitamins-Minerals (HAIR SKIN AND NAILS FORMULA) TABS   nitroGLYCERIN (NITROSTAT) 0.4 MG SL tablet   potassium chloride (KLOR-CON) 8 MEQ tablet   topiramate (TOPAMAX) 25 MG tablet   valsartan-hydrochlorothiazide (DIOVAN-HCT) 320-25 MG tablet   Blood Pressure Monitor DEVI    Shonna Chock, PA-C Surgical Short Stay/Anesthesiology [facility] Phone [phone] [facility] Phone [phone] 08/01/2022 4:16 PM

## 2022-08-08 ENCOUNTER — Other Ambulatory Visit: Payer: Medicaid Other

## 2022-08-10 ENCOUNTER — Inpatient Hospital Stay (HOSPITAL_COMMUNITY): Payer: Medicaid Other | Admitting: Physician Assistant

## 2022-08-10 ENCOUNTER — Encounter (HOSPITAL_COMMUNITY)
Admission: RE | Disposition: A | Discharge status: Home / Self Care | Payer: Self-pay | Source: Home / Self Care | Attending: Vascular Surgery

## 2022-08-10 ENCOUNTER — Other Ambulatory Visit: Payer: Self-pay

## 2022-08-10 ENCOUNTER — Inpatient Hospital Stay (HOSPITAL_COMMUNITY)
Admission: RE | Admit: 2022-08-10 | Discharge: 2022-08-11 | DRG: 272 | Disposition: A | Discharge status: Home / Self Care | Payer: Medicaid Other | Attending: Vascular Surgery | Admitting: Vascular Surgery

## 2022-08-10 ENCOUNTER — Inpatient Hospital Stay (HOSPITAL_COMMUNITY): Payer: Medicaid Other | Admitting: Vascular Surgery

## 2022-08-10 ENCOUNTER — Encounter (HOSPITAL_COMMUNITY): Payer: Self-pay | Admitting: Vascular Surgery

## 2022-08-10 DIAGNOSIS — R7303 Prediabetes: Secondary | ICD-10-CM | POA: Diagnosis not present

## 2022-08-10 DIAGNOSIS — I739 Peripheral vascular disease, unspecified: Secondary | ICD-10-CM | POA: Diagnosis present

## 2022-08-10 DIAGNOSIS — N183 Chronic kidney disease, stage 3 unspecified: Secondary | ICD-10-CM

## 2022-08-10 DIAGNOSIS — I70222 Atherosclerosis of native arteries of extremities with rest pain, left leg: Secondary | ICD-10-CM | POA: Diagnosis not present

## 2022-08-10 DIAGNOSIS — Z87891 Personal history of nicotine dependence: Secondary | ICD-10-CM | POA: Diagnosis not present

## 2022-08-10 DIAGNOSIS — Z79899 Other long term (current) drug therapy: Secondary | ICD-10-CM | POA: Diagnosis not present

## 2022-08-10 DIAGNOSIS — D509 Iron deficiency anemia, unspecified: Secondary | ICD-10-CM | POA: Diagnosis not present

## 2022-08-10 DIAGNOSIS — F411 Generalized anxiety disorder: Secondary | ICD-10-CM | POA: Diagnosis not present

## 2022-08-10 DIAGNOSIS — I998 Other disorder of circulatory system: Principal | ICD-10-CM | POA: Diagnosis present

## 2022-08-10 DIAGNOSIS — J439 Emphysema, unspecified: Secondary | ICD-10-CM | POA: Diagnosis not present

## 2022-08-10 DIAGNOSIS — E782 Mixed hyperlipidemia: Secondary | ICD-10-CM

## 2022-08-10 DIAGNOSIS — D631 Anemia in chronic kidney disease: Secondary | ICD-10-CM | POA: Diagnosis not present

## 2022-08-10 DIAGNOSIS — N1832 Chronic kidney disease, stage 3b: Secondary | ICD-10-CM | POA: Diagnosis present

## 2022-08-10 DIAGNOSIS — I129 Hypertensive chronic kidney disease with stage 1 through stage 4 chronic kidney disease, or unspecified chronic kidney disease: Secondary | ICD-10-CM | POA: Diagnosis not present

## 2022-08-10 DIAGNOSIS — E785 Hyperlipidemia, unspecified: Secondary | ICD-10-CM | POA: Diagnosis not present

## 2022-08-10 DIAGNOSIS — I2510 Atherosclerotic heart disease of native coronary artery without angina pectoris: Secondary | ICD-10-CM

## 2022-08-10 DIAGNOSIS — I350 Nonrheumatic aortic (valve) stenosis: Secondary | ICD-10-CM | POA: Diagnosis not present

## 2022-08-10 DIAGNOSIS — N2889 Other specified disorders of kidney and ureter: Secondary | ICD-10-CM | POA: Diagnosis present

## 2022-08-10 DIAGNOSIS — I700 Atherosclerosis of aorta: Secondary | ICD-10-CM | POA: Diagnosis not present

## 2022-08-10 HISTORY — PX: ENDARTERECTOMY FEMORAL: SHX5804

## 2022-08-10 LAB — CREATININE, SERUM
Creatinine, Ser: 1.37 mg/dL — ABNORMAL HIGH (ref 0.44–1.00)
GFR, Estimated: 43 mL/min — ABNORMAL LOW (ref >60)

## 2022-08-10 LAB — CBC
HCT: 38.2 % (ref 36.0–46.0)
Hemoglobin: 12.5 g/dL (ref 12.0–15.0)
MCH: 31.3 pg (ref 26.0–34.0)
MCHC: 32.7 g/dL (ref 30.0–36.0)
MCV: 95.7 fL (ref 80.0–100.0)
Platelets: 171 10*3/uL (ref 150–400)
RBC: 3.99 MIL/uL (ref 3.87–5.11)
RDW: 14.8 % (ref 11.5–15.5)
WBC: 6.6 10*3/uL (ref 4.0–10.5)
nRBC: 0 % (ref 0.0–0.2)

## 2022-08-10 SURGERY — ENDARTERECTOMY, FEMORAL
Anesthesia: General | Site: Groin | Laterality: Left

## 2022-08-10 MED ORDER — STERILE WATER FOR IRRIGATION IR SOLN
Status: DC | PRN
  Administered 2022-08-10: 1000 mL

## 2022-08-10 MED ORDER — ATORVASTATIN CALCIUM 10 MG PO TABS
30.0000 mg | ORAL_TABLET | Freq: Every day | ORAL | Status: DC
  Administered 2022-08-10: 30 mg via ORAL
  Filled 2022-08-10: qty 3

## 2022-08-10 MED ORDER — TOPIRAMATE 25 MG PO TABS
25.0000 mg | ORAL_TABLET | Freq: Every day | ORAL | Status: DC
  Administered 2022-08-10: 25 mg via ORAL
  Filled 2022-08-10: qty 1

## 2022-08-10 MED ORDER — PROTAMINE SULFATE 10 MG/ML IV SOLN
INTRAVENOUS | Status: AC
  Filled 2022-08-10: qty 5

## 2022-08-10 MED ORDER — PHENYLEPHRINE HCL-NACL 20-0.9 MG/250ML-% IV SOLN
INTRAVENOUS | Status: DC | PRN
  Administered 2022-08-10: 25 ug/min via INTRAVENOUS

## 2022-08-10 MED ORDER — IRBESARTAN 300 MG PO TABS
300.0000 mg | ORAL_TABLET | Freq: Every day | ORAL | Status: DC
  Administered 2022-08-11: 300 mg via ORAL
  Filled 2022-08-10: qty 1

## 2022-08-10 MED ORDER — ROCURONIUM BROMIDE 10 MG/ML (PF) SYRINGE
PREFILLED_SYRINGE | INTRAVENOUS | Status: DC | PRN
  Administered 2022-08-10: 50 mg via INTRAVENOUS

## 2022-08-10 MED ORDER — CILOSTAZOL 100 MG PO TABS
100.0000 mg | ORAL_TABLET | Freq: Two times a day (BID) | ORAL | Status: DC
  Administered 2022-08-10 – 2022-08-11 (×2): 100 mg via ORAL
  Filled 2022-08-10 (×3): qty 1

## 2022-08-10 MED ORDER — POTASSIUM CHLORIDE CRYS ER 20 MEQ PO TBCR
20.0000 meq | EXTENDED_RELEASE_TABLET | Freq: Every day | ORAL | Status: DC
  Administered 2022-08-10 – 2022-08-11 (×2): 20 meq via ORAL
  Filled 2022-08-10 (×2): qty 1

## 2022-08-10 MED ORDER — ORAL CARE MOUTH RINSE: 15.0000 mL | Freq: Once | OROMUCOSAL | Status: AC

## 2022-08-10 MED ORDER — HEPARIN SODIUM (PORCINE) 1000 UNIT/ML IJ SOLN
INTRAMUSCULAR | Status: DC | PRN
  Administered 2022-08-10: 7000 [IU] via INTRAVENOUS

## 2022-08-10 MED ORDER — POTASSIUM CHLORIDE CRYS ER 20 MEQ PO TBCR: 20.0000 meq | EXTENDED_RELEASE_TABLET | Freq: Every day | ORAL | Status: DC | PRN

## 2022-08-10 MED ORDER — FENTANYL CITRATE (PF) 100 MCG/2ML IJ SOLN
INTRAMUSCULAR | Status: AC
  Filled 2022-08-10: qty 2

## 2022-08-10 MED ORDER — POTASSIUM CHLORIDE ER 8 MEQ PO TBCR
16.0000 meq | EXTENDED_RELEASE_TABLET | Freq: Every day | ORAL | Status: DC
  Filled 2022-08-10: qty 2

## 2022-08-10 MED ORDER — OXYCODONE HCL 5 MG PO TABS: 5.0000 mg | ORAL_TABLET | Freq: Once | ORAL | Status: DC | PRN

## 2022-08-10 MED ORDER — SODIUM CHLORIDE 0.9 % IV SOLN: INTRAVENOUS | Status: DC

## 2022-08-10 MED ORDER — ACETAMINOPHEN 325 MG PO TABS: 325.0000 mg | ORAL_TABLET | ORAL | Status: DC | PRN

## 2022-08-10 MED ORDER — CHLORHEXIDINE GLUCONATE CLOTH 2 % EX PADS: 6.0000 | MEDICATED_PAD | Freq: Once | CUTANEOUS | Status: DC

## 2022-08-10 MED ORDER — SODIUM CHLORIDE 0.9 % IV SOLN: 500.0000 mL | Freq: Once | INTRAVENOUS | Status: DC | PRN

## 2022-08-10 MED ORDER — FENTANYL CITRATE (PF) 250 MCG/5ML IJ SOLN
INTRAMUSCULAR | Status: AC
  Filled 2022-08-10: qty 5

## 2022-08-10 MED ORDER — VALSARTAN-HYDROCHLOROTHIAZIDE 320-25 MG PO TABS: 1.0000 | ORAL_TABLET | Freq: Every day | ORAL | Status: DC

## 2022-08-10 MED ORDER — CEFAZOLIN SODIUM-DEXTROSE 2-4 GM/100ML-% IV SOLN
2.0000 g | INTRAVENOUS | Status: AC
  Administered 2022-08-10: 2 g via INTRAVENOUS
  Filled 2022-08-10: qty 100

## 2022-08-10 MED ORDER — HEPARIN 6000 UNIT IRRIGATION SOLUTION
Status: DC | PRN
  Administered 2022-08-10: 1

## 2022-08-10 MED ORDER — MIDAZOLAM HCL 2 MG/2ML IJ SOLN
INTRAMUSCULAR | Status: DC | PRN
  Administered 2022-08-10: 2 mg via INTRAVENOUS

## 2022-08-10 MED ORDER — LACTATED RINGERS IV SOLN: INTRAVENOUS | Status: DC

## 2022-08-10 MED ORDER — HEMOSTATIC AGENTS (NO CHARGE) OPTIME
TOPICAL | Status: DC | PRN
  Administered 2022-08-10: 1 via TOPICAL

## 2022-08-10 MED ORDER — PROTAMINE SULFATE 10 MG/ML IV SOLN
INTRAVENOUS | Status: DC | PRN
  Administered 2022-08-10: 50 mg via INTRAVENOUS

## 2022-08-10 MED ORDER — ASPIRIN 81 MG PO TBEC
81.0000 mg | DELAYED_RELEASE_TABLET | Freq: Every day | ORAL | Status: DC
  Administered 2022-08-11: 81 mg via ORAL
  Filled 2022-08-10: qty 1

## 2022-08-10 MED ORDER — LABETALOL HCL 5 MG/ML IV SOLN: 10.0000 mg | INTRAVENOUS | Status: DC | PRN

## 2022-08-10 MED ORDER — HYDROMORPHONE HCL 1 MG/ML IJ SOLN: 0.5000 mg | INTRAMUSCULAR | Status: DC | PRN

## 2022-08-10 MED ORDER — EPHEDRINE SULFATE-NACL 50-0.9 MG/10ML-% IV SOSY
PREFILLED_SYRINGE | INTRAVENOUS | Status: DC | PRN
  Administered 2022-08-10 (×2): 5 mg via INTRAVENOUS

## 2022-08-10 MED ORDER — ONDANSETRON HCL 4 MG/2ML IJ SOLN: 4.0000 mg | Freq: Four times a day (QID) | INTRAMUSCULAR | Status: DC | PRN

## 2022-08-10 MED ORDER — CHLORHEXIDINE GLUCONATE 0.12 % MT SOLN
15.0000 mL | Freq: Once | OROMUCOSAL | Status: AC
  Administered 2022-08-10: 15 mL via OROMUCOSAL
  Filled 2022-08-10: qty 15

## 2022-08-10 MED ORDER — ALUM & MAG HYDROXIDE-SIMETH 200-200-20 MG/5ML PO SUSP: 15.0000 mL | ORAL | Status: DC | PRN

## 2022-08-10 MED ORDER — SENNOSIDES-DOCUSATE SODIUM 8.6-50 MG PO TABS: 1.0000 | ORAL_TABLET | Freq: Every evening | ORAL | Status: DC | PRN

## 2022-08-10 MED ORDER — HYDRALAZINE HCL 20 MG/ML IJ SOLN: 5.0000 mg | INTRAMUSCULAR | Status: DC | PRN

## 2022-08-10 MED ORDER — PROPOFOL 10 MG/ML IV BOLUS
INTRAVENOUS | Status: DC | PRN
  Administered 2022-08-10: 30 mg via INTRAVENOUS
  Administered 2022-08-10: 120 mg via INTRAVENOUS

## 2022-08-10 MED ORDER — ACETAMINOPHEN 500 MG PO TABS
1000.0000 mg | ORAL_TABLET | Freq: Once | ORAL | Status: AC
  Administered 2022-08-10: 1000 mg via ORAL
  Filled 2022-08-10: qty 2

## 2022-08-10 MED ORDER — GUAIFENESIN-DM 100-10 MG/5ML PO SYRP: 15.0000 mL | ORAL_SOLUTION | ORAL | Status: DC | PRN

## 2022-08-10 MED ORDER — CEFAZOLIN SODIUM-DEXTROSE 2-4 GM/100ML-% IV SOLN
2.0000 g | Freq: Three times a day (TID) | INTRAVENOUS | Status: AC
  Administered 2022-08-10 – 2022-08-11 (×2): 2 g via INTRAVENOUS
  Filled 2022-08-10 (×2): qty 100

## 2022-08-10 MED ORDER — ONDANSETRON HCL 4 MG/2ML IJ SOLN: 4.0000 mg | Freq: Once | INTRAMUSCULAR | Status: DC | PRN

## 2022-08-10 MED ORDER — SUGAMMADEX SODIUM 200 MG/2ML IV SOLN
INTRAVENOUS | Status: DC | PRN
  Administered 2022-08-10: 200 mg via INTRAVENOUS

## 2022-08-10 MED ORDER — OXYCODONE HCL 5 MG/5ML PO SOLN: 5.0000 mg | Freq: Once | ORAL | Status: DC | PRN

## 2022-08-10 MED ORDER — LIDOCAINE 2% (20 MG/ML) 5 ML SYRINGE
INTRAMUSCULAR | Status: DC | PRN
  Administered 2022-08-10: 60 mg via INTRAVENOUS

## 2022-08-10 MED ORDER — PANTOPRAZOLE SODIUM 40 MG PO TBEC
40.0000 mg | DELAYED_RELEASE_TABLET | Freq: Every day | ORAL | Status: DC
  Administered 2022-08-10 – 2022-08-11 (×2): 40 mg via ORAL
  Filled 2022-08-10 (×2): qty 1

## 2022-08-10 MED ORDER — BISACODYL 5 MG PO TBEC: 5.0000 mg | DELAYED_RELEASE_TABLET | Freq: Every day | ORAL | Status: DC | PRN

## 2022-08-10 MED ORDER — FLEET ENEMA 7-19 GM/118ML RE ENEM: 1.0000 | ENEMA | Freq: Once | RECTAL | Status: DC | PRN

## 2022-08-10 MED ORDER — MIDAZOLAM HCL 2 MG/2ML IJ SOLN
INTRAMUSCULAR | Status: AC
  Filled 2022-08-10: qty 2

## 2022-08-10 MED ORDER — MAGNESIUM SULFATE 2 GM/50ML IV SOLN: 2.0000 g | Freq: Every day | INTRAVENOUS | Status: DC | PRN

## 2022-08-10 MED ORDER — CARVEDILOL 25 MG PO TABS
25.0000 mg | ORAL_TABLET | Freq: Two times a day (BID) | ORAL | Status: DC
  Administered 2022-08-10 – 2022-08-11 (×2): 25 mg via ORAL
  Filled 2022-08-10 (×2): qty 1

## 2022-08-10 MED ORDER — ROCURONIUM BROMIDE 10 MG/ML (PF) SYRINGE
PREFILLED_SYRINGE | INTRAVENOUS | Status: AC
  Filled 2022-08-10: qty 10

## 2022-08-10 MED ORDER — HEPARIN SODIUM (PORCINE) 1000 UNIT/ML IJ SOLN
INTRAMUSCULAR | Status: AC
  Filled 2022-08-10: qty 10

## 2022-08-10 MED ORDER — DOCUSATE SODIUM 100 MG PO CAPS
100.0000 mg | ORAL_CAPSULE | Freq: Every day | ORAL | Status: DC
  Administered 2022-08-11: 100 mg via ORAL
  Filled 2022-08-10: qty 1

## 2022-08-10 MED ORDER — DEXAMETHASONE SODIUM PHOSPHATE 10 MG/ML IJ SOLN
INTRAMUSCULAR | Status: DC | PRN
  Administered 2022-08-10: 10 mg via INTRAVENOUS

## 2022-08-10 MED ORDER — FENTANYL CITRATE (PF) 100 MCG/2ML IJ SOLN
25.0000 ug | INTRAMUSCULAR | Status: DC | PRN
  Administered 2022-08-10: 50 ug via INTRAVENOUS

## 2022-08-10 MED ORDER — LIDOCAINE 2% (20 MG/ML) 5 ML SYRINGE
INTRAMUSCULAR | Status: AC
  Filled 2022-08-10: qty 5

## 2022-08-10 MED ORDER — PROPOFOL 10 MG/ML IV BOLUS
INTRAVENOUS | Status: AC
  Filled 2022-08-10: qty 20

## 2022-08-10 MED ORDER — AMLODIPINE BESYLATE 10 MG PO TABS
10.0000 mg | ORAL_TABLET | Freq: Every day | ORAL | Status: DC
  Administered 2022-08-11: 10 mg via ORAL
  Filled 2022-08-10: qty 1

## 2022-08-10 MED ORDER — METOPROLOL TARTRATE 5 MG/5ML IV SOLN: 2.0000 mg | INTRAVENOUS | Status: DC | PRN

## 2022-08-10 MED ORDER — ONDANSETRON HCL 4 MG/2ML IJ SOLN
INTRAMUSCULAR | Status: AC
  Filled 2022-08-10: qty 2

## 2022-08-10 MED ORDER — PHENOL 1.4 % MT LIQD: 1.0000 | OROMUCOSAL | Status: DC | PRN

## 2022-08-10 MED ORDER — FENTANYL CITRATE (PF) 250 MCG/5ML IJ SOLN
INTRAMUSCULAR | Status: DC | PRN
  Administered 2022-08-10 (×2): 50 ug via INTRAVENOUS

## 2022-08-10 MED ORDER — HEPARIN SODIUM (PORCINE) 5000 UNIT/ML IJ SOLN
5000.0000 [IU] | Freq: Three times a day (TID) | INTRAMUSCULAR | Status: DC
  Administered 2022-08-10 – 2022-08-11 (×2): 5000 [IU] via SUBCUTANEOUS
  Filled 2022-08-10 (×2): qty 1

## 2022-08-10 MED ORDER — ONDANSETRON HCL 4 MG/2ML IJ SOLN
INTRAMUSCULAR | Status: DC | PRN
  Administered 2022-08-10: 4 mg via INTRAVENOUS

## 2022-08-10 MED ORDER — ACETAMINOPHEN 650 MG RE SUPP: 325.0000 mg | RECTAL | Status: DC | PRN

## 2022-08-10 MED ORDER — ASPIRIN 81 MG PO CHEW: 81.0000 mg | CHEWABLE_TABLET | Freq: Every day | ORAL | Status: DC

## 2022-08-10 MED ORDER — 0.9 % SODIUM CHLORIDE (POUR BTL) OPTIME
TOPICAL | Status: DC | PRN
  Administered 2022-08-10 (×2): 1000 mL

## 2022-08-10 MED ORDER — OXYCODONE-ACETAMINOPHEN 5-325 MG PO TABS
1.0000 | ORAL_TABLET | ORAL | Status: DC | PRN
  Administered 2022-08-10: 1 via ORAL
  Filled 2022-08-10: qty 1

## 2022-08-10 MED ORDER — HEPARIN 6000 UNIT IRRIGATION SOLUTION
Status: AC
  Filled 2022-08-10: qty 500

## 2022-08-10 MED ORDER — HYDROCHLOROTHIAZIDE 25 MG PO TABS
25.0000 mg | ORAL_TABLET | Freq: Every day | ORAL | Status: DC
  Administered 2022-08-11: 25 mg via ORAL
  Filled 2022-08-10: qty 1

## 2022-08-10 SURGICAL SUPPLY — 39 items
ADH SKN CLS APL DERMABOND .7 (GAUZE/BANDAGES/DRESSINGS) ×1
BAG COUNTER SPONGE SURGICOUNT (BAG) ×1 IMPLANT
BAG SPNG CNTER NS LX DISP (BAG) ×1
BOOT SUTURE AID YELLOW STND (SUTURE) IMPLANT
CANISTER SUCT 3000ML PPV (MISCELLANEOUS) ×1 IMPLANT
CATH EMB LATEX FREE 4FRX80 (CATHETERS) ×1
CATH EMB LATEX FREE 4FRX80CM (CATHETERS) ×1
CATH EMB LF 4FRX80 (CATHETERS) IMPLANT
CLIP LIGATING EXTRA MED SLVR (CLIP) ×1 IMPLANT
CLIP LIGATING EXTRA SM BLUE (MISCELLANEOUS) ×1 IMPLANT
DERMABOND ADVANCED .7 DNX12 (GAUZE/BANDAGES/DRESSINGS) ×1 IMPLANT
DRAIN CHANNEL 15F RND FF W/TCR (WOUND CARE) IMPLANT
ELECT REM PT RETURN 9FT ADLT (ELECTROSURGICAL) ×1
ELECTRODE REM PT RTRN 9FT ADLT (ELECTROSURGICAL) ×1 IMPLANT
EVACUATOR SILICONE 100CC (DRAIN) IMPLANT
GLOVE BIO SURGEON STRL SZ7.5 (GLOVE) ×1 IMPLANT
GLOVE BIOGEL PI IND STRL 7.5 (GLOVE) IMPLANT
GOWN STRL REUS W/ TWL LRG LVL3 (GOWN DISPOSABLE) ×2 IMPLANT
GOWN STRL REUS W/ TWL XL LVL3 (GOWN DISPOSABLE) ×1 IMPLANT
GOWN STRL REUS W/TWL LRG LVL3 (GOWN DISPOSABLE) ×2
GOWN STRL REUS W/TWL XL LVL3 (GOWN DISPOSABLE) ×1
KIT BASIN OR (CUSTOM PROCEDURE TRAY) ×1 IMPLANT
KIT TURNOVER KIT B (KITS) ×1 IMPLANT
NS IRRIG 1000ML POUR BTL (IV SOLUTION) ×2 IMPLANT
PACK PERIPHERAL VASCULAR (CUSTOM PROCEDURE TRAY) ×1 IMPLANT
PAD ARMBOARD 7.5X6 YLW CONV (MISCELLANEOUS) ×2 IMPLANT
POWDER SURGICEL 3.0 GRAM (HEMOSTASIS) IMPLANT
STOPCOCK 4 WAY LG BORE MALE ST (IV SETS) IMPLANT
SUT MNCRL AB 4-0 PS2 18 (SUTURE) ×1 IMPLANT
SUT PROLENE 5 0 C 1 24 (SUTURE) ×1 IMPLANT
SUT PROLENE 6 0 BV (SUTURE) ×1 IMPLANT
SUT VIC AB 2-0 CT1 27 (SUTURE) ×1
SUT VIC AB 2-0 CT1 TAPERPNT 27 (SUTURE) ×1 IMPLANT
SUT VIC AB 3-0 SH 27 (SUTURE) ×2
SUT VIC AB 3-0 SH 27X BRD (SUTURE) ×1 IMPLANT
TOWEL GREEN STERILE (TOWEL DISPOSABLE) ×2 IMPLANT
TOWEL GREEN STERILE FF (TOWEL DISPOSABLE) ×1 IMPLANT
UNDERPAD 30X36 HEAVY ABSORB (UNDERPADS AND DIAPERS) ×1 IMPLANT
WATER STERILE IRR 1000ML POUR (IV SOLUTION) ×1 IMPLANT

## 2022-08-10 NOTE — Transfer of Care (Signed)
Immediate Anesthesia Transfer of Care Note  Patient: Brooke Chapman  Procedure(s) Performed: LEFT COMMON FEMORAL ENDARTERECTOMY (Left: Groin)  Patient Location: PACU  Anesthesia Type:General  Level of Consciousness: awake and alert   Airway & Oxygen Therapy: Patient Spontanous Breathing  Post-op Assessment: Report given to RN and Post -op Vital signs reviewed and stable  Post vital signs: Reviewed and stable  Last Vitals:  Vitals Value Taken Time  BP 176/104 08/10/22 1156  Temp    Pulse 76 08/10/22 1157  Resp 22 08/10/22 1158  SpO2 91 % 08/10/22 1157  Vitals shown include unfiled device data.  Last Pain:  Vitals:   08/10/22 0744  TempSrc:   PainSc: 0-No pain      Patients Stated Pain Goal: 0 (08/10/22 0744)  Complications: No notable events documented.

## 2022-08-10 NOTE — Op Note (Signed)
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 Randie Heinz, MD Assistant: Clinton Gallant, PA Procedure Performed: 1.  Harvest left greater saphenous vein 2.  Extensive left common femoral endarterectomy with external iliac, superficial femoral and profundofemoral endarterectomy with vein patch angioplasty  Indications: 62 year old female with a history of bilateral lower extremity rest pain he has previously undergone right common femoral endarterectomy and has a history of a left common iliac artery stent.  She is does have rest pain on the left with a toe pressure of 0 and is indicated for left common femoral endarterectomy.  She does not have suitable vein for bypass so we have elected for vein harvest for patch angioplasty.  Findings: Common femoral artery was subtotally occluded with heavily calcified plaque extending into the inguinal ligament into the external iliac artery and the profunda also was heavily calcified initially could not be clamped until endarterectomy was performed.  We also performed endarterectomy of the SFA and had trickle backbleeding.  At completion there was a high resistance waveform in the SFA with low resistance signal in the profunda and very strong pulsatile inflow of the common femoral artery.   Procedure:  The patient was identified in the holding area and taken to the operating room where she was placed supine on the operative table and general anesthesia was induced.  She was sterilely prepped draped in the left groin in usual fashion, antibiotics were up-to-date timeout was called.  We began with a vertical incision in the left groin dissected down to the very calcified common femoral artery under the inguinal ligament we divided the crossing veins.  There was 1 site amenable for  clamping under the inguinal ligament but other than that was very calcified.  We dissected out the profunda and the SFA.  The same incision we identified the great saphenous vein and dissected this for a total of 8 cm.  This was transected distally and tied off.  At the saphenofemoral junction we clamped with a side-biting clamp transected oversewed the junction with a 5-0 Prolene suture in running mattress fashion.  Patient was then fully heparinized.  After heparin circulated for 3 minutes we clamped the outflow SFA and profunda although was heavily calcified did not fully clamp as well as the external iliac artery.  We then opened the vessel longitudinally.  I placed a 4 Fogarty retrograde to the extrailiac artery and inflated this with a stopcock to obtain hemostasis.  I then dissected and performed endarterectomy of the entire common femoral artery down to the profunda and SFA.  I was able to establish strong backbleeding from the profunda and then was able to clamp the artery.  Under the inguinal ligament I dissected for several centimeters around the Fogarty c catheter and perform endarterectomy hide.  The vein that was harvested was then spatulated and sewn in place with 5-0 Prolene suture.  Prior completion without flushing and irrigation in all directions.  On completion there is very strong pulsatile inflow and a strong signal in the profunda with high resistance signal in the SFA.  50 mg of protamine was administered we diligently obtain hemostasis irrigated the wound and closed in layers with Vicryl and Monocryl.  Dermabond was placed at the skin level.  Patient was awakened from anesthesia having tolerated the procedure well without immediate complication.  All counts were  correct at completion.   EBL: 100cc  Lunden Stieber C. Randie Heinz, MD [facility] [place] Office: [phone] Pager: [phone]

## 2022-08-10 NOTE — Progress Notes (Signed)
        Left femoral incision healing well without hematoma Palpable DP pulse left foot, motor and sensation intact Lungs non labored breathing  S/P Left CFA endarterectomy Stable post op disposition     Mosetta Pigeon PA-C

## 2022-08-10 NOTE — Anesthesia Postprocedure Evaluation (Signed)
Anesthesia Post Note  Patient: Brooke Chapman  Procedure(s) Performed: LEFT COMMON FEMORAL ENDARTERECTOMY (Left: Groin)     Patient location during evaluation: PACU Anesthesia Type: General Level of consciousness: awake and alert Pain management: pain level controlled Vital Signs Assessment: post-procedure vital signs reviewed and stable Respiratory status: spontaneous breathing, nonlabored ventilation, respiratory function stable and patient connected to nasal cannula oxygen Cardiovascular status: blood pressure returned to baseline and stable Postop Assessment: no apparent nausea or vomiting Anesthetic complications: no   No notable events documented.  Last Vitals:  Vitals:   08/10/22 1300 08/10/22 1315  BP: 133/84 (!) 142/85  Pulse: 64 (!) 59  Resp: 13 13  Temp:    SpO2: 100% 97%                  Beryle Lathe

## 2022-08-10 NOTE — Anesthesia Procedure Notes (Signed)
Procedure Name: Intubation Date/Time: 08/10/2022 10:05 AM  Performed by: Sheppard Evens, CRNAPre-anesthesia Checklist: Patient identified, Emergency Drugs available, Suction available and Patient being monitored Patient Re-evaluated:Patient Re-evaluated prior to induction Oxygen Delivery Method: Circle System Utilized Preoxygenation: Pre-oxygenation with 100% oxygen Induction Type: IV induction Ventilation: Mask ventilation without difficulty Laryngoscope Size: Mac and 3 Grade View: Grade I Tube type: Oral Tube size: 7.5 mm Number of attempts: 1 Airway Equipment and Method: Stylet and Oral airway Placement Confirmation: ETT inserted through vocal cords under direct vision, positive ETCO2 and breath sounds checked- equal and bilateral Secured at: 21 cm Tube secured with: Tape Dental Injury: Teeth and Oropharynx as per pre-operative assessment

## 2022-08-10 NOTE — H&P (Signed)
HPI 62 year old female status post right common femoral endarterectomy for rest pain.  She states the pain is now resolved.  She does have pain in the left foot which wakes her at night sometimes.  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     Review of Systems Left foot pain     Objective:    Vitals:   08/10/22 0735  BP: (!) 150/97  Pulse: 70  Resp: 18  Temp: 98 F (36.7 C)  SpO2: 96%    Awake alert and oriented Nonlabored respirations Right dorsalis pedis and posterior tibial signals much stronger than the left   ABI Findings:  +---------+------------------+-----+-------------------+--------+  Right   Rt Pressure (mmHg)IndexWaveform           Comment   +---------+------------------+-----+-------------------+--------+  Brachial 136                                                 +---------+------------------+-----+-------------------+--------+  PTA     77                0.54 dampened monophasic          +---------+------------------+-----+-------------------+--------+  DP      84                0.59 monophasic                   +---------+------------------+-----+-------------------+--------+  Great Toe63                0.44 Abnormal                     +---------+------------------+-----+-------------------+--------+   +---------+------------------+-----+-------------------+-------+  Left    Lt Pressure (mmHg)IndexWaveform           Comment  +---------+------------------+-----+-------------------+-------+  Brachial 142                                                +---------+------------------+-----+-------------------+-------+  PTA     72                0.51 dampened monophasic         +---------+------------------+-----+-------------------+-------+  DP      69                0.49 monophasic                  +---------+------------------+-----+-------------------+-------+  Great Toe                        Absent                      +---------+------------------+-----+-------------------+-------+   +-------+-----------+-----------+------------+------------+  ABI/TBIToday's ABIToday's TBIPrevious ABIPrevious TBI  +-------+-----------+-----------+------------+------------+  Right 0.59       0.44       0.63        0.34          +-------+-----------+-----------+------------+------------+  Left  0.51       absent     0.45        0.35          +-------+-----------+-----------+------------+------------+       Right ABIs  appear decreased. Left ABIs appear increased.    Summary:  Right: Resting right ankle-brachial index indicates moderate right lower  extremity arterial disease. The right toe-brachial index is abnormal.   Left: Resting left ankle-brachial index indicates moderate left lower  extremity arterial disease.      Assessment/Plan    62 year old female status post right common femoral endarterectomy with well-healed wound.  She has a stent on the left side and does have rest pain with toe pressure of 0.  Plan will be for left common femoral endarterectomy today in OR.  We again discussed the risk benefits alternatives and she demonstrates good understanding.  She does not have satisfactory vein for bypass and so likely will use vein as a patch as we did on the right side.  All questions were answered today.           Merlon Alcorta C. Randie Heinz, MD [facility] [place] Office: [phone] Pager: [phone]

## 2022-08-10 NOTE — Anesthesia Procedure Notes (Signed)
Arterial Line Insertion Start/End7/12/2022 10:00 AM, 08/10/2022 10:20 AM Performed by: Beryle Lathe, MD, anesthesiologist  Patient location: OR. Preanesthetic checklist: patient identified, IV checked, risks and benefits discussed, surgical consent, monitors and equipment checked, pre-op evaluation, timeout performed and anesthesia consent Patient sedated Left, radial was placed Catheter size: 20 G Hand hygiene performed   Attempts: 4 (Previous attempts by CRNA unsuccessful) Procedure performed using ultrasound guided technique. Ultrasound Notes:anatomy identified, needle tip was noted to be adjacent to the nerve/plexus identified and no ultrasound evidence of intravascular and/or intraneural injection Post procedure complications: unsuccessful attempts and second provider assisted. Patient tolerated the procedure well with no immediate complications. Additional procedure comments: Multiple attempts by CRNA and MD both with and without ultrasound after induction of GA, unable to successfully catheretize left radial artery. Procedure aborted.Marland Kitchen

## 2022-08-11 ENCOUNTER — Encounter (HOSPITAL_COMMUNITY): Payer: Self-pay | Admitting: Vascular Surgery

## 2022-08-11 LAB — CBC
HCT: 35.9 % — ABNORMAL LOW (ref 36.0–46.0)
Hemoglobin: 11.9 g/dL — ABNORMAL LOW (ref 12.0–15.0)
MCH: 31.5 pg (ref 26.0–34.0)
MCHC: 33.1 g/dL (ref 30.0–36.0)
MCV: 95 fL (ref 80.0–100.0)
Platelets: 163 10*3/uL (ref 150–400)
RBC: 3.78 MIL/uL — ABNORMAL LOW (ref 3.87–5.11)
RDW: 14.6 % (ref 11.5–15.5)
WBC: 6.2 10*3/uL (ref 4.0–10.5)
nRBC: 0 % (ref 0.0–0.2)

## 2022-08-11 LAB — LIPID PANEL
Cholesterol: 167 mg/dL (ref 0–200)
HDL: 46 mg/dL (ref >40)
LDL Cholesterol: 109 mg/dL — ABNORMAL HIGH (ref 0–99)
Total CHOL/HDL Ratio: 3.6 RATIO
Triglycerides: 62 mg/dL (ref <150)
VLDL: 12 mg/dL (ref 0–40)

## 2022-08-11 LAB — BASIC METABOLIC PANEL WITH GFR
Anion gap: 8 (ref 5–15)
BUN: 16 mg/dL (ref 8–23)
CO2: 24 mmol/L (ref 22–32)
Calcium: 8.3 mg/dL — ABNORMAL LOW (ref 8.9–10.3)
Chloride: 102 mmol/L (ref 98–111)
Creatinine, Ser: 1.29 mg/dL — ABNORMAL HIGH (ref 0.44–1.00)
GFR, Estimated: 46 mL/min — ABNORMAL LOW
Glucose, Bld: 116 mg/dL — ABNORMAL HIGH (ref 70–99)
Potassium: 4.2 mmol/L (ref 3.5–5.1)
Sodium: 134 mmol/L — ABNORMAL LOW (ref 135–145)

## 2022-08-11 MED ORDER — ATORVASTATIN CALCIUM 40 MG PO TABS: 40.0000 mg | ORAL_TABLET | Freq: Every day | ORAL | 1 refills | Status: DC

## 2022-08-11 MED ORDER — OXYCODONE-ACETAMINOPHEN 5-325 MG PO TABS: 1.0000 | ORAL_TABLET | Freq: Four times a day (QID) | ORAL | 0 refills | Status: DC | PRN

## 2022-08-11 MED ORDER — ATORVASTATIN CALCIUM 40 MG PO TABS
40.0000 mg | ORAL_TABLET | Freq: Every day | ORAL | Status: DC
  Administered 2022-08-11: 40 mg via ORAL
  Filled 2022-08-11: qty 1

## 2022-08-11 MED ORDER — OXYCODONE-ACETAMINOPHEN 5-325 MG PO TABS: 1.0000 | ORAL_TABLET | ORAL | 0 refills | Status: DC | PRN

## 2022-08-11 NOTE — Progress Notes (Signed)
Pt ambulated x 150 feet with front wheel walker, pt tolerated well

## 2022-08-11 NOTE — Progress Notes (Addendum)
[facility] [place]  Subjective  - doing well   Objective 130/68 (!) 59 98.2 F (36.8 C) (Oral) 17 95%  Intake/Output Summary (Last 24 hours) at 08/11/2022 0724 Last data filed at 08/11/2022 0343 Gross per 24 hour  Intake 2250 ml  Output 900 ml  Net 1350 ml   Left groin/femoral incision healing well without hematoma Palpable DP pulse left foot, motor and sensation intact Lungs non labored breathing    Assessment/Planning: 62 year old female with a history of bilateral lower extremity rest pain he has previously undergone right common femoral endarterectomy and has a history of a left common iliac artery stent. She is does have rest pain on the left with a toe pressure of 0 and is indicated for left common femoral endarterectomy.   POD # 1 S/P Left CFA endarterectomy   Palpable left DP History of CKD with Cr near baseline 1.29 down from 1.37.  Urine OP 700 last 24 hours Discharge home ion stable condition with palpable DP and well healing groin incision. F/U in 2-3 weeks for incision check    Mosetta Pigeon 08/11/2022 7:24 AM --  VASCULAR STAFF ADDENDUM: I have independently interviewed and examined the patient. I agree with the above.  Home today  Fara Olden, MD [facility] Phone Number: [phone] 08/11/2022 9:17 AM    Laboratory Lab Results: Recent Labs    08/10/22 1423 08/11/22 0117  WBC 6.6 6.2  HGB 12.5 11.9*  HCT 38.2 35.9*  PLT 171 163   BMET Recent Labs    08/10/22 1423 08/11/22 0117  NA  --  134*  K  --  4.2  CL  --  102  CO2  --  24  GLUCOSE  --  116*  BUN  --  16  CREATININE 1.37* 1.29*  CALCIUM  --  8.3*    COAG Lab Results  Component Value Date   INR 1.0 07/31/2022   INR 1.1 06/13/2022   INR 1.1 02/16/2022   No results found for: "PTT"

## 2022-08-11 NOTE — Evaluation (Signed)
Physical Therapy Evaluation Patient Details Name: MADYSN FARACE MRN: [id number] DOB: 1960/09/19 Today's Date: 08/11/2022  History of Present Illness  62 year old female who underwent L common femoral endarterectomy on 7/12. XLK:GMWNU common femoral endarterectomy on 02/20/22. PMH: smoking, HTN, HLD, aortic stenosis, PAD (left CIA stent 07/13/19; anemia, pre-diabetes, CKD with atropic right kidney , left renal mass, mediastinal mass   Clinical Impression  Pt admitted with above. Pt functioning mod I at this time with use of RW for long distance ambulation. Pt educated on gentle ROM to L LE at hip and knee to aide in minimizing tightness from the surgery. Pt will not need follow up PT post d/c however with refer pt to mobility team and Acute PT will continue to follow while in hospital.        Assistance Recommended at Discharge Intermittent Supervision/Assistance  If plan is discharge home, recommend the following:  Can travel by private vehicle  A little help with bathing/dressing/bathroom;Help with stairs or ramp for entrance;Assist for transportation        Equipment Recommendations Rolling walker (2 wheels);BSC/3in1  Recommendations for Other Services       Functional Status Assessment Patient has had a recent decline in their functional status and demonstrates the ability to make significant improvements in function in a reasonable and predictable amount of time.     Precautions / Restrictions Precautions Precautions: Fall Restrictions Weight Bearing Restrictions: Yes LLE Weight Bearing: Weight bearing as tolerated      Mobility  Bed Mobility               General bed mobility comments: pt was in chair upon PT arrival    Transfers Overall transfer level: Needs assistance Equipment used: None Transfers: Sit to/from Stand Sit to Stand: Supervision           General transfer comment: no difficulty, supervision for line management     Ambulation/Gait Ambulation/Gait assistance: Supervision Gait Distance (Feet): 250 Feet Assistive device: Rolling walker (2 wheels), None Gait Pattern/deviations: Step-through pattern, Decreased stride length, Trunk flexed Gait velocity: dec, but better with RW Gait velocity interpretation: 1.31 - 2.62 ft/sec, indicative of limited community ambulator   General Gait Details: pt able to ambulate in room and short distances without RW however pt with decreased step height and length and progressive antalgic gait pattern due to L LE pain. When amb with RW pt with fluid reciprocal gait pattern and able to maintain upright posture longer.  Stairs Stairs: Yes Stairs assistance: Min guard Stair Management: One rail Left, Step to pattern Number of Stairs: 3 (x2) General stair comments: educated pt on sequencing "up with the good, down with the bad", pt able to return demonstrate  Wheelchair Mobility     Tilt Bed    Modified Rankin (Stroke Patients Only)       Balance Overall balance assessment: Mild deficits observed, not formally tested                                           Pertinent Vitals/Pain Pain Assessment Pain Assessment: 0-10 Pain Score: 4  Pain Location: L groin during ambulation when hip in extension Pain Descriptors / Indicators: Aching Pain Intervention(s): Limited activity within patient's tolerance    Home Living Family/patient expects to be discharged to:: Private residence Living Arrangements: Spouse/significant other Available Help at Discharge: Family;Available 24 hours/day Type of Home: House  Home Access: Stairs to enter Entrance Stairs-Rails: Left Entrance Stairs-Number of Steps: 3   Home Layout: One level Home Equipment: None      Prior Function Prior Level of Function : Independent/Modified Independent;Driving             Mobility Comments: works at [employer], 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, now works in the lobby ADLs  Comments: indep     Higher education careers adviser   Dominant Hand: Right    Extremity/Trunk Assessment   Upper Extremity Assessment Upper Extremity Assessment: Overall WFL for tasks assessed    Lower Extremity Assessment Lower Extremity Assessment: LLE deficits/detail LLE Deficits / Details: limited hip/knee ROM due to pain from surgery    Cervical / Trunk Assessment Cervical / Trunk Assessment: Normal  Communication   Communication: No difficulties  Cognition Arousal/Alertness: Awake/alert Behavior During Therapy: WFL for tasks assessed/performed Overall Cognitive Status: Within Functional Limits for tasks assessed                                          General Comments General comments (skin integrity, edema, etc.): VSS    Exercises Other Exercises Other Exercises: educated on gentle AROM to L hip and knee   Assessment/Plan    PT Assessment Patient needs continued PT services  PT Problem List Decreased strength;Decreased range of motion;Decreased activity tolerance;Decreased balance;Decreased mobility;Decreased coordination       PT Treatment Interventions DME instruction;Gait training;Stair training;Functional mobility training;Therapeutic activities;Therapeutic exercise;Balance training    PT Goals (Current goals can be found in the Care Plan section)  Acute Rehab PT Goals Patient Stated Goal: home PT Goal Formulation: With patient Time For Goal Achievement: 08/25/22 Potential to Achieve Goals: Good    Frequency Min 1X/week     Co-evaluation               AM-PAC PT "6 Clicks" Mobility  Outcome Measure Help needed turning from your back to your side while in a flat bed without using bedrails?: None Help needed moving from lying on your back to sitting on the side of a flat bed without using bedrails?: None Help needed moving to and from a bed to a chair (including a wheelchair)?: None Help needed standing up from a chair using your arms  (e.g., wheelchair or bedside chair)?: None Help needed to walk in hospital room?: A Little Help needed climbing 3-5 steps with a railing? : A Little 6 Click Score: 22    End of Session   Activity Tolerance: Patient tolerated treatment well Patient left: in chair;with call bell/phone within reach Nurse Communication: Mobility status PT Visit Diagnosis: Unsteadiness on feet (R26.81);Muscle weakness (generalized) (M62.81);Difficulty in walking, not elsewhere classified (R26.2)    Time: 1610-9604 PT Time Calculation (min) (ACUTE ONLY): 18 min   Charges:   PT Evaluation $PT Eval Moderate Complexity: 1 Mod   PT General Charges $$ ACUTE PT VISIT: 1 Visit         Lewis Shock, PT, DPT Acute Rehabilitation Services Secure chat preferred Office #: [phone]   Iona Hansen 08/11/2022, 1:24 PM

## 2022-08-11 NOTE — Progress Notes (Signed)
1:55 pm - Pt has been DC. Contacted pt at [phone] to discuss PT recommendations for DME (RW, 3-in-1 BSC), left VM.  3:04 pm - #2 call. Left VM.

## 2022-08-11 NOTE — Progress Notes (Signed)
PHARMACIST LIPID MONITORING   Brooke Chapman is a 63 y.o. female admitted on 08/10/2022 with a history of bilateral lower extremity rest pain who is now s/p Left CFA endarterectomy. Pharmacy has been consulted to optimize lipid-lowering therapy with the indication of secondary prevention for clinical ASCVD.  Recent Labs:  Lipid Panel (last 6 months):   Lab Results  Component Value Date   CHOL 167 08/11/2022   TRIG 62 08/11/2022   HDL 46 08/11/2022   CHOLHDL 3.6 08/11/2022   VLDL 12 08/11/2022   LDLCALC 109 (H) 08/11/2022    Hepatic function panel (last 6 months):   Lab Results  Component Value Date   AST 25 07/31/2022   ALT 14 07/31/2022   ALKPHOS 58 07/31/2022   BILITOT 0.7 07/31/2022    SCr (since admission):   Serum creatinine: 1.29 mg/dL (H) 57/84/69 6295 Estimated creatinine clearance: 38 mL/min (A)  Current therapy and lipid therapy tolerance Current lipid-lowering therapy: lipitor Previous lipid-lowering therapies (if applicable): none noted Documented or reported allergies or intolerances to lipid-lowering therapies (if applicable): none noted  Assessment:   Patient agrees with changes to lipid-lowering therapy  Plan:    1.Statin intensity (high intensity recommended for all patients regardless of the LDL):  Add or increase statin to high intensity.  2.Add ezetimibe (if any one of the following):   Not indicated at this time.  3.Refer to lipid clinic:   No  4.Follow-up with:  PCP  5.Follow-up labs after discharge:  Changes in lipid therapy were made. Check a lipid panel in 8-12 weeks then annually.       Greta Doom BS, PharmD, BCPS Clinical Pharmacist 08/11/2022 8:49 AM  Contact: [phone] after 3 PM  "Be curious, not judgmental..." -Debbora Dus

## 2022-08-11 NOTE — Discharge Instructions (Addendum)
You may shower 48 hours after surgery.  Walk alternating with elevation of her legs for edema PRN

## 2022-08-13 ENCOUNTER — Telehealth: Payer: Self-pay

## 2022-08-13 NOTE — Transitions of Care (Post Inpatient/ED Visit) (Signed)
08/13/2022  Name: Brooke Chapman MRN: [id number] DOB: 08-Dec-1960  Today's TOC FU Call Status: Today's TOC FU Call Status:: Successful TOC FU Call Competed TOC FU Call Complete Date: 08/13/22  Transition Care Management Follow-up Telephone Call Date of Discharge: 08/11/22 Discharge Facility: [facility]) Type of Discharge: Inpatient Admission Primary Inpatient Discharge Diagnosis:: s/p left CFA endarterectomy How have you been since you were released from the hospital?: Better Any questions or concerns?: Yes Patient Questions/Concerns:: She would like a cane.  I eplained to her that the hospital case manager tried to reach her about DME.  She said she never received a call.  I instructed her to call the floor she was on - 4E and request to speak to the case manager.  I told her that her PCP would need an appointment with her prior to ordering a cane because we need to have documentation to support the need for the equipment. Patient Questions/Concerns Addressed: Other: (instructed patient as noted above.)  Items Reviewed: Did you receive and understand the discharge instructions provided?: Yes Medications obtained,verified, and reconciled?: Partial Review Completed Reason for Partial Mediation Review: She said she has all of her medications and did not need to review the complete list of meds and she did not have any questions about the med regime.  Regarding the atorvastatin, she said she was instructed not to the the 30  mg and to take the 40 mg instead. Any new allergies since your discharge?: No Dietary orders reviewed?: Yes Type of Diet Ordered:: heart healthy Do you have support at home?: Yes Name of Support/Comfort Primary Source: family and friends  Medications Reviewed Today: Medications Reviewed Today     Reviewed by Sheppard Evens, CRNA (Certified Registered Nurse Anesthetist) on 08/10/22 at 0830  Med List Status: Complete   Medication Order Taking? Sig Documenting  Provider Last Dose Status Informant  amLODipine (NORVASC) 10 MG tablet [id number] Yes Take 1 tablet (10 mg total) by mouth daily. Marcine Matar, MD 08/10/2022 0600 Active Self  aspirin 81 MG chewable tablet [id number] Yes Chew 1 tablet (81 mg total) by mouth daily. Anders Simmonds, [place] 08/10/2022 0600 Active Self  atorvastatin (LIPITOR) 20 MG tablet [id number] Yes Take 1.5 tablets (30 mg total) by mouth daily. Marcine Matar, MD 08/09/2022 Active Self  Blood Pressure Monitor DEVI [id number]  Use as directed to check home blood pressure 2-3 times a week Marcine Matar, MD  Active Self  carvedilol (COREG) 25 MG tablet [id number] Yes Take 1 tablet (25 mg total) by mouth 2 (two) times daily. Marcine Matar, MD 08/10/2022 0600 Active Self  cilostazol (PLETAL) 100 MG tablet [id number] Yes Take 1 tablet (100 mg total) by mouth 2 (two) times daily. Marcine Matar, MD 08/10/2022 0600 Active Self  cloNIDine (CATAPRES) tablet 0.1 mg [id number]   Anders Simmonds, PA-C  Consider Medication Status and Discontinue   ferrous sulfate 325 (65 FE) MG tablet [id number] Yes Take 1 tablet (325 mg total) by mouth 2 (two) times daily with a meal.  Patient taking differently: Take 325 mg by mouth once a week.   Marcine Matar, MD Past Month Active Self           Med Note Darl Householder Feb 14, 2022  4:23 PM)    Multiple Vitamins-Minerals (HAIR SKIN AND NAILS FORMULA) TABS [id number] No Take 2 tablets by mouth daily. [provider] More than a month Active Self  nitroGLYCERIN (NITROSTAT) 0.4 MG SL tablet [id number] Yes Place 1 tablet (0.4 mg total) under the tongue every 5 (five) minutes as needed. Garwin Brothers, MD  Expired 07/30/22 2359 Self           Med Note (DAVIS, NATALIE M   Fri Aug 10, 2022  7:43 AM) Never used per patient  potassium chloride (KLOR-CON) 8 MEQ tablet [id number] Yes Take 2 tablets (16 mEq total) by mouth daily. Marcine Matar, MD 08/09/2022 Active Self  topiramate  (TOPAMAX) 25 MG tablet [id number] Yes Take 1 tablet (25 mg total) by mouth at bedtime. Marcine Matar, MD 08/09/2022 Active Self  valsartan-hydrochlorothiazide (DIOVAN-HCT) 320-25 MG tablet [id number] Yes Take 1 tablet by mouth once daily Marcine Matar, MD  Active Self            Home Care and Equipment/Supplies: Were Home Health Services Ordered?: No Any new equipment or medical supplies ordered?: No  Functional Questionnaire: Do you need assistance with bathing/showering or dressing?: No Do you need assistance with meal preparation?: Yes (She currently has some assistance) Do you need assistance with eating?: No Do you have difficulty maintaining continence: No Do you need assistance with getting out of bed/getting out of a chair/moving?: No (She said she is ambulating independently without an assistive device; but she thinks she may benefit from a cane.) Do you have difficulty managing or taking your medications?: No  Follow up appointments reviewed: PCP Follow-up appointment confirmed?: Yes Date of PCP follow-up appointment?: 10/18/22 Follow-up Provider: Dr Laural Benes -  she does not want to schedule an appointment to be seen sooner Specialist Hospital Follow-up appointment confirmed?: No Reason Specialist Follow-Up Not Confirmed: Patient has Specialist Provider Number and will Call for Appointment (She needs a follow up with [facility].  The patient said she just got home and they told her they would be calling her to schedule the appointment) Do you need transportation to your follow-up appointment?: No Do you understand care options if your condition(s) worsen?: Yes-patient verbalized understanding    SIGNATURE  Robyne Peers, RN

## 2022-08-15 NOTE — Discharge Summary (Signed)
[facility] Discharge Summary   Patient ID:  Brooke Chapman MRN: [id number] DOB/AGE: 64-Oct-1962 64 y.o.  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 Dorris Fetch, patient opted for surveillance imaging (12/12/21)   PAD (peripheral artery disease) (HCC)    Porokeratosis 08/26/2018   Pre-diabetes    PVD (peripheral vascular disease) (HCC) 08/26/2018   Skin lesion 05/26/2021   Tobacco dependence 05/01/2018   Unintended weight loss 01/13/2020    Procedure(s): LEFT COMMON FEMORAL ENDARTERECTOMY  Discharged Condition: good  HPI: 62 year old female with a history of bilateral lower extremity rest pain he has previously undergone right common femoral endarterectomy and has a history of a left common iliac artery stent. She is does have rest pain on the left with a toe pressure of 0 and is indicated for left common femoral endarterectomy.   She was schedule for surgical intervention left femoral endarterectomy.    Hospital Course:  Brooke Chapman is a 62 y.o. female is S/P Left Procedure(s): LEFT COMMON FEMORAL ENDARTERECTOMY  Left femoral incision  healing well without hematoma Palpable DP pulse left foot, motor and sensation intact.  She is stable , ambulating and tolerating PO's.  She was discharged post op day 1.   Significant Diagnostic Studies: CBC Lab Results  Component Value Date   WBC 6.2 08/11/2022   HGB 11.9 (L) 08/11/2022   HCT 35.9 (L) 08/11/2022   MCV 95.0 08/11/2022   PLT 163 08/11/2022    BMET    Component Value Date/Time   NA 134 (L) 08/11/2022 0117   NA 136 02/08/2022 1211   K 4.2 08/11/2022 0117   CL 102 08/11/2022 0117   CO2 24 08/11/2022 0117   GLUCOSE 116 (H) 08/11/2022 0117   BUN 16 08/11/2022 0117   BUN 19 02/08/2022 1211   CREATININE 1.29 (H) 08/11/2022 0117   CALCIUM 8.3 (L) 08/11/2022 0117   GFRNONAA 46 (L) 08/11/2022 0117   GFRAA 79 05/11/2019 1534   COAG Lab Results  Component Value Date   INR 1.0 07/31/2022   INR 1.1 06/13/2022   INR 1.1 02/16/2022     Disposition:  Discharge to :Home Discharge Instructions     Call MD for:  redness, tenderness, or signs of infection (pain, swelling, bleeding, redness, odor or green/yellow discharge around incision site)   Complete by: As directed    Call MD for:  redness, tenderness, or signs of infection (pain, swelling, bleeding, redness, odor or green/yellow discharge around incision site)   Complete by: As directed    Call MD for:  severe or increased pain, loss or decreased feeling  in affected limb(s)   Complete by: As directed    Call MD for:  severe or increased pain, loss or decreased feeling  in affected limb(s)   Complete by: As directed    Call MD for:  temperature >100.5   Complete by: As directed    Call MD for:  temperature >100.5   Complete by: As directed    Resume previous diet   Complete by: As directed    Resume previous diet   Complete by: As directed       Allergies as of 08/11/2022       Reactions   Latex Rash        Medication List     TAKE these medications    amLODipine 10 MG tablet Commonly known  as: NORVASC Take 1 tablet (10 mg total) by mouth daily.   aspirin 81 MG chewable tablet Chew 1 tablet (81 mg total) by mouth daily.   atorvastatin 20 MG tablet Commonly known as: LIPITOR Take 1.5 tablets (30 mg total) by mouth daily. What changed: Another medication with the same name was added. Make sure you understand how and when to take each.   atorvastatin 40 MG tablet Commonly known as: LIPITOR Take 1 tablet (40 mg total) by mouth daily. What changed: You were already taking a medication with the same name, and this prescription was added. Make sure you understand how and when to take each.   Blood Pressure Monitor Devi Use as directed to check home blood pressure 2-3 times a week   carvedilol 25 MG tablet Commonly known as: COREG Take 1 tablet (25 mg total) by mouth 2 (two) times daily.   cilostazol 100 MG tablet Commonly known as: PLETAL Take 1 tablet (100 mg total) by mouth 2 (two) times daily.   ferrous sulfate 325 (65 FE) MG tablet Take 1 tablet (325 mg total) by mouth 2 (two) times daily with a meal. What changed: when to take this   Hair Skin and Nails Formula Tabs Take 2 tablets by mouth daily.   nitroGLYCERIN 0.4 MG SL tablet Commonly known as: NITROSTAT Place 1 tablet (0.4 mg total) under the tongue every 5 (five) minutes as needed.   oxyCODONE-acetaminophen 5-325 MG tablet Commonly known as: PERCOCET/ROXICET Take 1-2 tablets by mouth every 4 (four) hours as needed for moderate pain.   oxyCODONE-acetaminophen 5-325 MG tablet Commonly known as: PERCOCET/ROXICET Take 1 tablet by mouth every 6 (six) hours as needed.   potassium chloride 8 MEQ tablet Commonly known as: KLOR-CON Take 2 tablets (16 mEq total) by mouth daily.   topiramate 25 MG tablet Commonly known as: Topamax Take 1 tablet (25 mg total) by mouth at bedtime.   valsartan-hydrochlorothiazide 320-25 MG tablet Commonly known as: DIOVAN-HCT Take 1 tablet by mouth once daily        Verbal and written Discharge instructions given to the patient. Wound care per Discharge AVS F/U in 2-3 weeks for incision check office will call  Signed: Mosetta Pigeon 08/15/2022, 4:41 PM

## 2022-08-16 ENCOUNTER — Telehealth: Payer: Self-pay | Admitting: Physician Assistant

## 2022-08-16 NOTE — Telephone Encounter (Signed)
Appt has been scheduled.

## 2022-08-16 NOTE — Telephone Encounter (Signed)
-----   Message from Mosetta Pigeon sent at 08/15/2022  4:47 PM EDT ----- Karin Lieu s/p left femoral endarterectomy f/u wound check in 2-3 weeks pa schedule on a Friday

## 2022-09-07 ENCOUNTER — Encounter: Payer: Medicaid Other | Admitting: Vascular Surgery

## 2022-09-28 ENCOUNTER — Encounter: Payer: Medicaid Other | Admitting: Vascular Surgery

## 2022-10-03 ENCOUNTER — Other Ambulatory Visit: Payer: Self-pay | Admitting: Physician Assistant

## 2022-10-03 ENCOUNTER — Encounter: Payer: Self-pay | Admitting: Vascular Surgery

## 2022-10-03 ENCOUNTER — Ambulatory Visit (INDEPENDENT_AMBULATORY_CARE_PROVIDER_SITE_OTHER): Payer: Medicaid Other | Admitting: Vascular Surgery

## 2022-10-03 VITALS — BP 115/81 | HR 69 | Temp 98.3°F | Resp 20 | Ht 64.0 in | Wt 142.0 lb

## 2022-10-03 DIAGNOSIS — I70222 Atherosclerosis of native arteries of extremities with rest pain, left leg: Secondary | ICD-10-CM

## 2022-10-03 NOTE — Progress Notes (Signed)
     Subjective:     Patient ID: Brooke Chapman, female   DOB: 11-Apr-1960, 62 y.o.   MRN: [id number]  HPI 62 year old female status post left common femoral endarterectomy.  She now has bilateral common femoral endarterectomies and states that she is back to work at [employer] at [employer] and she denies any leg pain.  She states that claudication is totally resolved.   Review of Systems As above    Objective:   Physical Exam Vitals:   10/03/22 1547  BP: 115/81  Pulse: 69  Resp: 20  Temp: 98.3 F (36.8 C)  SpO2: 98%   awake alert and oriented Bilateral groin incisions well-healed with palpable common femoral pulses    Assessment/plan     62 year old female status post left common femoral endarterectomy with vein patch angioplasty for rest pain.  She is doing well and her incisions are well-healed.  She will follow-up in 3 months with [facility]Is.        Skyley Grandmaison C. Randie Heinz, MD [facility] [place] Office: [phone] Pager: [phone]

## 2022-10-08 ENCOUNTER — Other Ambulatory Visit: Payer: Self-pay | Admitting: Internal Medicine

## 2022-10-08 DIAGNOSIS — I10 Essential (primary) hypertension: Secondary | ICD-10-CM

## 2022-10-08 NOTE — Telephone Encounter (Signed)
Medication Refill - Medication: amLODipine (NORVASC) 10 MG tablet Pt has one pill left.    Has the patient contacted their pharmacy? Yes.   Pharmacy advised pt to reach out to her PCP.   Preferred Pharmacy (with phone number or street name): [id number] - [place], [place] - [address]  Phone: [phone] Fax: [phone]  Has the patient been seen for an appointment in the last year OR does the patient have an upcoming appointment? Yes.    Agent: Please be advised that RX refills may take up to 3 business days. We ask that you follow-up with your pharmacy.

## 2022-10-09 MED ORDER — AMLODIPINE BESYLATE 10 MG PO TABS
10.0000 mg | ORAL_TABLET | Freq: Every day | ORAL | 2 refills | Status: DC
Start: 2022-10-09 — End: 2023-03-20

## 2022-10-09 NOTE — Telephone Encounter (Signed)
Requested Prescriptions  Pending Prescriptions Disp Refills   amLODipine (NORVASC) 10 MG tablet 30 tablet 2    Sig: Take 1 tablet (10 mg total) by mouth daily.     Cardiovascular: Calcium Channel Blockers 2 Passed - 10/08/2022 12:07 PM      Passed - Last BP in normal range    BP Readings from Last 1 Encounters:  10/03/22 115/81         Passed - Last Heart Rate in normal range    Pulse Readings from Last 1 Encounters:  10/03/22 69         Passed - Valid encounter within last 6 months    Recent Outpatient Visits           2 months ago Essential hypertension   [place] [facility] [place], Cornelius Moras, RPH-CPP   3 months ago Essential hypertension   [place] [facility] Marcine Matar, MD   8 months ago Essential hypertension   [place] [facility] Marcine Matar, MD   11 months ago Essential hypertension   [facility] [place], Cornelius Moras, RPH-CPP   1 year ago Pap smear for cervical cancer screening   [place] [facility] Marcine Matar, MD       Future Appointments             In 1 week Marcine Matar, MD [facility]   In 1 month Drema Dallas, DO [facility]

## 2022-10-15 ENCOUNTER — Ambulatory Visit: Payer: Self-pay | Admitting: Internal Medicine

## 2022-10-16 ENCOUNTER — Other Ambulatory Visit: Payer: Self-pay

## 2022-10-16 DIAGNOSIS — I739 Peripheral vascular disease, unspecified: Secondary | ICD-10-CM

## 2022-10-18 ENCOUNTER — Ambulatory Visit: Payer: Medicaid Other | Admitting: Internal Medicine

## 2022-10-19 ENCOUNTER — Other Ambulatory Visit (HOSPITAL_BASED_OUTPATIENT_CLINIC_OR_DEPARTMENT_OTHER): Payer: Self-pay | Admitting: Internal Medicine

## 2022-10-19 DIAGNOSIS — Z1231 Encounter for screening mammogram for malignant neoplasm of breast: Secondary | ICD-10-CM

## 2022-10-25 ENCOUNTER — Encounter (HOSPITAL_BASED_OUTPATIENT_CLINIC_OR_DEPARTMENT_OTHER): Payer: Self-pay

## 2022-10-25 ENCOUNTER — Ambulatory Visit (HOSPITAL_BASED_OUTPATIENT_CLINIC_OR_DEPARTMENT_OTHER)
Admission: RE | Admit: 2022-10-25 | Discharge: 2022-10-25 | Disposition: A | Discharge status: Home / Self Care | Payer: Medicaid Other | Source: Ambulatory Visit | Attending: Internal Medicine | Admitting: Internal Medicine

## 2022-10-25 DIAGNOSIS — Z1231 Encounter for screening mammogram for malignant neoplasm of breast: Secondary | ICD-10-CM | POA: Diagnosis not present

## 2022-10-29 ENCOUNTER — Other Ambulatory Visit: Payer: Self-pay | Admitting: Internal Medicine

## 2022-10-29 DIAGNOSIS — R928 Other abnormal and inconclusive findings on diagnostic imaging of breast: Secondary | ICD-10-CM

## 2022-11-07 NOTE — Progress Notes (Unsigned)
NEUROLOGY CONSULTATION NOTE  Brooke Chapman MRN: [id number] DOB: 18-Dec-1960  Referring provider: Jonah Blue, MD Primary care provider: Jonah Blue, MD  Reason for consult:  migraine  Assessment/Plan:   New onset headache over 50, query cluster headache vs migraine.      As these are new headaches, secondary etiologies must be ruled out: MRI brain with and without contrast and MRA head Sed rate and CRP Advised to take topiramate 25mg  every night at bedtime (not as needed).  If no improvement in 4 weeks, increase to 50mg  at bedtime Keep headache diary Follow up with PCP regarding blood pressure Follow up with me in 5-6 months.   Subjective:  Brooke Chapman is a 62 year old right-handed female with HTN, HLD, CKD, iron-deficiency anemia, PAD and GAD who presents for migraines.  History supplemented by referring provider's note.  Onset:  April-May 2024.   Location:  left retro-orbital/left fronto-temporal Quality:  throbbing Intensity:  10/10.  She denies new headache, thunderclap headache or severe headache that wakes her from sleep. Aura:  absent Prodrome:  absent Associated symptoms:  Photophobia, phonophobia, eye lacrimation, conjunctival injection (unsure if there is ptosis).  She denies associated nausea, unilateral numbness or weakness.  Unsure if she has visual disturbance Duration:  an hour but goes to sleep.  Occurs at night 8-9 PM Frequency:  varies.  On average 3 times a week Triggers:  unsure.  Have not noticed if alcohol triggers it.   Relieving factors:  cold pack Activity:  can't move.  Needs to be still  Past NSAIDS/analgesics:  Tylenol Past abortive triptans:  none Past abortive ergotamine:  none Past muscle relaxants:  none Past anti-emetic:  none Past antihypertensive medications:  clonidine, valsartan-hydrochlorothiazide, losartan Past antidepressant medications:  sertraline, bupropion Past anticonvulsant medications:  none Past anti-CGRP:   none  Current NSAIDS/analgesics:  ASA 81mg  daily Current triptans:  none Current ergotamine:  none Current anti-emetic:  none Current muscle relaxants:  none Current Antihypertensive medications:  carvedilol, amlodipine, valsartan-HCTZ Current Antidepressant medications:  none Current Anticonvulsant medications:  topiramate 25mg  at bedtime (helpful) Current anti-CGRP:  none Current Vitamins/Herbal/Supplements:  ferrous sulfate, MVI, KCl Current Antihistamines/Decongestants:  none Other therapy:  ice pack Other medications:  atorvastatin, Pletal, NTG  No prior history of headaches      PAST MEDICAL HISTORY: Past Medical History:  Diagnosis Date   Anemia    Antiplatelet or antithrombotic long-term use 01/13/2020   Aortic stenosis 07/05/2021   mild AS by echo   Black stools 01/13/2020   CKD (chronic kidney disease)    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 Dorris Fetch, patient opted for surveillance imaging (12/12/21)   PAD (peripheral artery disease) (HCC)    Porokeratosis 08/26/2018   Pre-diabetes    PVD (peripheral vascular disease) (HCC) 08/26/2018   Skin lesion 05/26/2021   Tobacco dependence 05/01/2018   Unintended weight loss 01/13/2020    PAST SURGICAL HISTORY: Past Surgical History:  Procedure Laterality Date   ABDOMINAL AORTOGRAM W/LOWER EXTREMITY Bilateral 07/13/2019   Procedure: ABDOMINAL AORTOGRAM W/LOWER EXTREMITY;  Surgeon: Maeola Harman, MD;  Location: [facility] INVASIVE CV LAB;  Service: Cardiovascular;  Laterality: Bilateral;   ABDOMINAL AORTOGRAM W/LOWER EXTREMITY N/A  01/08/2022   Procedure: ABDOMINAL AORTOGRAM W/LOWER EXTREMITY;  Surgeon: Victorino Sparrow, MD;   Location: [facility] INVASIVE CV LAB;  Service: Vascular;  Laterality: N/A;   BREAST EXCISIONAL BIOPSY Left    LESS THAN 5 YEARS AGO   COLONOSCOPY     ENDARTERECTOMY FEMORAL Right 02/20/2022   Procedure: RIGHT COMMON FEMORAL ENDARTERECTOMY WITH VEIN PATCH;  Surgeon: Maeola Harman, MD;  Location: [facility] OR;  Service: Vascular;  Laterality: Right;   ENDARTERECTOMY FEMORAL Left 08/10/2022   Procedure: LEFT COMMON FEMORAL ENDARTERECTOMY;  Surgeon: Maeola Harman, MD;  Location: [facility] OR;  Service: Vascular;  Laterality: Left;   PERIPHERAL VASCULAR INTERVENTION Left 07/13/2019   Procedure: PERIPHERAL VASCULAR INTERVENTION;  Surgeon: Maeola Harman, MD;  Location: [facility] INVASIVE CV LAB;  Service: Cardiovascular;  Laterality: Left;  common iliac    MEDICATIONS: Current Outpatient Medications on File Prior to Visit  Medication Sig Dispense Refill   amLODipine (NORVASC) 10 MG tablet Take 1 tablet (10 mg total) by mouth daily. 30 tablet 2   aspirin 81 MG chewable tablet Chew 1 tablet (81 mg total) by mouth daily. 100 tablet 1   atorvastatin (LIPITOR) 20 MG tablet Take 1.5 tablets (30 mg total) by mouth daily. 135 tablet 3   atorvastatin (LIPITOR) 40 MG tablet Take 1 tablet (40 mg total) by mouth daily. 30 tablet 1   Blood Pressure Monitor DEVI Use as directed to check home blood pressure 2-3 times a week 1 Device 0   carvedilol (COREG) 25 MG tablet Take 1 tablet (25 mg total) by mouth 2 (two) times daily. 180 tablet 3   cilostazol (PLETAL) 100 MG tablet Take 1 tablet (100 mg total) by mouth 2 (two) times daily. 180 tablet 3   ferrous sulfate 325 (65 FE) MG tablet Take 1 tablet (325 mg total) by mouth 2 (two) times daily with a meal. (Patient taking differently: Take 325 mg by mouth once a week.) 100 tablet 1   Multiple Vitamins-Minerals (HAIR SKIN AND NAILS FORMULA) TABS Take 2 tablets by mouth daily.     nitroGLYCERIN (NITROSTAT) 0.4 MG SL tablet Place 1 tablet (0.4 mg total) under the  tongue every 5 (five) minutes as needed. 25 tablet 6   oxyCODONE-acetaminophen (PERCOCET/ROXICET) 5-325 MG tablet Take 1-2 tablets by mouth every 4 (four) hours as needed for moderate pain. 30 tablet 0   oxyCODONE-acetaminophen (PERCOCET/ROXICET) 5-325 MG tablet Take 1 tablet by mouth every 6 (six) hours as needed. 30 tablet 0   potassium chloride (KLOR-CON) 8 MEQ tablet Take 2 tablets (16 mEq total) by mouth daily. 180 tablet 1   topiramate (TOPAMAX) 25 MG tablet Take 1 tablet (25 mg total) by mouth at bedtime. 30 tablet 2   valsartan-hydrochlorothiazide (DIOVAN-HCT) 320-25 MG tablet Take 1 tablet by mouth once daily 90 tablet 0   Current Facility-Administered Medications on File Prior to Visit  Medication Dose Route Frequency Provider Last Rate Last Admin   cloNIDine (CATAPRES) tablet 0.1 mg  0.1 mg Oral Once McClung, Angela M, PA-C        ALLERGIES: Allergies  Allergen Reactions   Latex Rash    FAMILY HISTORY: Family History  Problem Relation Age of Onset   Heart disease Mother    Hypertension Mother    Hyperlipidemia Mother    Hypertension Brother    Heart disease Maternal Aunt    Hypertension Maternal Aunt    Hyperlipidemia Maternal Aunt    Colon cancer Neg Hx    Liver cancer  Neg Hx    Rectal cancer Neg Hx    Stomach cancer Neg Hx     Objective:  Blood pressure 139/85, pulse 72, height 5\' 5"  (1.651 m), weight 143 lb (64.9 kg), SpO2 99%. General: No acute distress.  Patient appears well-groomed.   Head:  Normocephalic/atraumatic Eyes:  fundi examined but not visualized Neck: supple, no paraspinal tenderness, full range of motion Heart: regular rate and rhythm Neurological Exam: Mental status: alert and oriented to person, place, and time, speech fluent and not dysarthric, language intact. Cranial nerves: CN I: not tested CN II: pupils equal, round and reactive to light, visual fields intact CN III, IV, VI:  full range of motion, no nystagmus, no ptosis CN V: facial  sensation intact. CN VII: upper and lower face symmetric CN VIII: hearing intact CN IX, X: gag intact, uvula midline CN XI: sternocleidomastoid and trapezius muscles intact CN XII: tongue midline Bulk & Tone: normal, no fasciculations. Motor:  muscle strength 5/5 throughout Sensation:  Pinprick and vibratory sensation intact. Deep Tendon Reflexes:  2+ throughout,  toes downgoing.   Finger to nose testing:  Without dysmetria.   Gait:  Normal station and stride.  Romberg negative.    Thank you for allowing me to take part in the care of this patient.  Shon Millet, DO  CC: Jonah Blue, MD

## 2022-11-08 ENCOUNTER — Ambulatory Visit: Payer: Medicaid Other

## 2022-11-08 ENCOUNTER — Ambulatory Visit: Payer: Medicaid Other | Admitting: Neurology

## 2022-11-08 ENCOUNTER — Encounter: Payer: Self-pay | Admitting: Neurology

## 2022-11-08 ENCOUNTER — Ambulatory Visit
Admission: RE | Admit: 2022-11-08 | Discharge: 2022-11-08 | Disposition: A | Discharge status: Home / Self Care | Payer: Medicaid Other | Source: Ambulatory Visit | Attending: Internal Medicine | Admitting: Internal Medicine

## 2022-11-08 ENCOUNTER — Other Ambulatory Visit: Payer: Self-pay | Admitting: Thoracic Surgery (Cardiothoracic Vascular Surgery)

## 2022-11-08 ENCOUNTER — Other Ambulatory Visit: Payer: Medicaid Other

## 2022-11-08 VITALS — BP 139/85 | HR 72 | Ht 65.0 in | Wt 143.0 lb

## 2022-11-08 DIAGNOSIS — G44019 Episodic cluster headache, not intractable: Secondary | ICD-10-CM | POA: Diagnosis not present

## 2022-11-08 DIAGNOSIS — I10 Essential (primary) hypertension: Secondary | ICD-10-CM | POA: Diagnosis not present

## 2022-11-08 DIAGNOSIS — R928 Other abnormal and inconclusive findings on diagnostic imaging of breast: Secondary | ICD-10-CM

## 2022-11-08 DIAGNOSIS — J9859 Other diseases of mediastinum, not elsewhere classified: Secondary | ICD-10-CM

## 2022-11-08 LAB — C-REACTIVE PROTEIN: CRP: <1 mg/dL (ref 0.5–20.0)

## 2022-11-08 LAB — SEDIMENTATION RATE: Sed Rate: 27 mm/h (ref 0–30)

## 2022-11-08 NOTE — Patient Instructions (Signed)
Take topiramate 25mg  EVERY NIGHT AT BEDTIME.  If no improvement in 4 weeks, contact me and we can increase dose Check MRI of brain with and without contrast and MRA of head Check sed rate and CRP Keep headache diary Follow up 5-6 months. Follow up with PCP regarding blood pressure

## 2022-11-14 ENCOUNTER — Encounter: Payer: Self-pay | Admitting: Thoracic Surgery (Cardiothoracic Vascular Surgery)

## 2022-11-15 ENCOUNTER — Encounter: Payer: Self-pay | Admitting: Thoracic Surgery (Cardiothoracic Vascular Surgery)

## 2022-11-26 ENCOUNTER — Other Ambulatory Visit: Payer: Self-pay | Admitting: Physician Assistant

## 2022-12-10 ENCOUNTER — Ambulatory Visit
Admission: RE | Admit: 2022-12-10 | Discharge: 2022-12-10 | Disposition: A | Discharge status: Home / Self Care | Payer: Medicaid Other | Source: Ambulatory Visit | Attending: Thoracic Surgery (Cardiothoracic Vascular Surgery) | Admitting: Thoracic Surgery (Cardiothoracic Vascular Surgery)

## 2022-12-10 DIAGNOSIS — J439 Emphysema, unspecified: Secondary | ICD-10-CM | POA: Diagnosis not present

## 2022-12-10 DIAGNOSIS — J9859 Other diseases of mediastinum, not elsewhere classified: Secondary | ICD-10-CM

## 2022-12-10 DIAGNOSIS — I700 Atherosclerosis of aorta: Secondary | ICD-10-CM | POA: Diagnosis not present

## 2022-12-10 DIAGNOSIS — R918 Other nonspecific abnormal finding of lung field: Secondary | ICD-10-CM | POA: Diagnosis not present

## 2022-12-10 DIAGNOSIS — I2510 Atherosclerotic heart disease of native coronary artery without angina pectoris: Secondary | ICD-10-CM | POA: Diagnosis not present

## 2022-12-18 ENCOUNTER — Encounter: Payer: Self-pay | Admitting: Thoracic Surgery (Cardiothoracic Vascular Surgery)

## 2022-12-18 ENCOUNTER — Ambulatory Visit: Payer: Medicaid Other | Admitting: Thoracic Surgery (Cardiothoracic Vascular Surgery)

## 2022-12-18 VITALS — BP 122/79 | HR 67 | Resp 20 | Ht 65.0 in | Wt 139.8 lb

## 2022-12-18 DIAGNOSIS — J9859 Other diseases of mediastinum, not elsewhere classified: Secondary | ICD-10-CM | POA: Diagnosis not present

## 2022-12-18 NOTE — Progress Notes (Signed)
[address]       Brooke Chapman [address]             [phone]    HPI: Brooke Chapman returns for follow-up of a paraspinal mass  Brooke Chapman is a 62 year old woman with a history of tobacco use, hypertension, hyperlipidemia, PAD, anemia, posterior mediastinal mass, stage III chronic kidney disease, heart murmur, and CAD.  Had a low-dose CT for lung cancer screening in 2022.  No suspicious lung nodules but there was a smoothly marginated posterior mediastinal mass.  Differential diagnosis was schwannoma versus solitary fibrous tumor.  I saw her then but recommended resection but she refused and wanted to do radiographic follow-up.  I last saw her in November 2023.  The posterior mediastinal mass was stable.  In the interim since that visit she has had bilateral common femoral endarterectomies.  She is back at work.  Continues to smoke about 5 cigarettes a day.  Her overall smoking history is about 50 pack years.  No chest pain, pressure, tightness, or shortness of breath.  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 Dorris Fetch, patient opted for surveillance imaging (12/12/21)   PAD (peripheral artery disease) (HCC)    Porokeratosis 08/26/2018   Pre-diabetes    PVD (peripheral vascular disease) (HCC) 08/26/2018   Skin lesion 05/26/2021   Tobacco dependence 05/01/2018   Unintended weight loss 01/13/2020    Current Outpatient Medications  Medication Sig Dispense  Refill   amLODipine (NORVASC) 10 MG tablet Take 1 tablet (10 mg total) by mouth daily. 30 tablet 2   aspirin 81 MG chewable tablet Chew 1 tablet (81 mg total) by mouth daily. 100 tablet 1   atorvastatin (LIPITOR) 40 MG tablet TAKE 1 TABLET BY MOUTH ONCE DAILY. REFILLS MUST COME FROM PCP OR CARDIOLOGY 30 tablet 4   Blood Pressure Monitor DEVI Use as directed to check home blood pressure 2-3 times a week 1 Device 0   carvedilol (COREG) 25 MG tablet Take 1 tablet (25 mg total) by mouth 2 (two) times daily. 180 tablet 3   cilostazol (PLETAL) 100 MG tablet Take 1 tablet (100 mg total) by mouth 2 (two) times daily. 180 tablet 3   ferrous sulfate 325 (65 FE) MG tablet Take 1 tablet (325 mg total) by mouth 2 (two) times daily with a meal. (Patient taking differently: Take 325 mg by mouth once a week.) 100 tablet 1   Multiple Vitamins-Minerals (HAIR SKIN AND NAILS FORMULA) TABS Take 2 tablets by mouth daily.     potassium chloride (KLOR-CON) 8 MEQ tablet Take 2 tablets (16 mEq total) by mouth daily. 180 tablet 1   topiramate (TOPAMAX) 25 MG tablet Take 1 tablet (25 mg total) by mouth at bedtime. 30 tablet 2   valsartan-hydrochlorothiazide (DIOVAN-HCT) 320-25 MG tablet Take 1 tablet by mouth once daily 90 tablet 0   nitroGLYCERIN (NITROSTAT) 0.4 MG SL tablet Place 1 tablet (0.4  mg total) under the tongue every 5 (five) minutes as needed. 25 tablet 6   Current Facility-Administered Medications  Medication Dose Route Frequency Provider Last Rate Last Admin   cloNIDine (CATAPRES) tablet 0.1 mg  0.1 mg Oral Once McClung, Angela M, PA-C        Physical Exam BP 122/79 (BP Location: Left Arm, Patient Position: Sitting, Cuff Size: Normal)   Pulse 67   Resp 20   Ht 5\' 5"  (1.651 m)   Wt 139 lb 12.8 oz (63.4 kg)   SpO2 100% Comment: RA  BMI 23.56 kg/m  61 year old woman in no acute distress Alert and oriented x 3 with no focal deficits Lungs clear bilaterally Cardiac regular rate and rhythm with a 2/6  systolic murmur No peripheral edema  Diagnostic Tests: CT CHEST WITHOUT CONTRAST   TECHNIQUE: Multidetector CT imaging of the chest was performed following the standard protocol without IV contrast.   RADIATION DOSE REDUCTION: This exam was performed according to the departmental dose-optimization program which includes automated exposure control, adjustment of the mA and/or kV according to patient size and/or use of iterative reconstruction technique.   COMPARISON:  Multiple priors, most recent chest CT dated September 29, 2021   FINDINGS: Cardiovascular: Normal heart size. No pericardial effusion. Normal caliber thoracic aorta with mild calcified plaque. Severe coronary artery calcifications.   Mediastinum/Nodes: Esophagus and thyroid are unremarkable. No enlarged lymph nodes seen in the chest. Stable posterior left pleural/mediastinal mass measuring 3.4 x 1.7 cm (remeasured in similar plane).   Lungs/Pleura: Central airways are patent. Mild emphysema. No consolidation, pleural effusion or pneumothorax. Unchanged numerous ill-defined centrilobular ground-glass nodules primarily in the upper lungs. Stable scattered small solid pulmonary nodules. Reference solid nodule of the right lower lobe measuring 4 mm on series 3, image 81.   Upper Abdomen: Atrophic right kidney. Unchanged Hyperattenuating lesion located adjacent to the upper pole of the left kidney, previously evaluated with MRI.   Musculoskeletal: No suspicious bone lesions identified.   IMPRESSION: 1. 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 Contrast-enhanced MRI of the thoracic spine could be performed for complete characterization. 2. Unchanged numerous ill-defined centrilobular ground-glass nodules primarily in the upper lungs, findings are likely due to smoking-related respiratory bronchiolitis. 3. Stable scattered small  solid pulmonary nodules. Recommend attention on annual lung cancer screening CT. 4. Coronary artery calcifications, aortic Atherosclerosis (ICD10-I70.0) and Emphysema (ICD10-J43.9).     Electronically Signed   By: Allegra Lai M.D.   On: 12/10/2022 13:37 I personally reviewed the CT images.  Stable smoothly marginated posterior mediastinal mass.  Groundglass lung nodules unchanged.  Emphysema, coronary and aortic atherosclerosis.  Impression: Brooke Chapman is a 62 year old woman with a history of tobacco use, hypertension, hyperlipidemia, PAD, anemia, posterior mediastinal mass, stage III chronic kidney disease, heart murmur, and CAD.    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  Stable over time with possible 1 to 2 mm growth over the past 3 years.  Consistent with a benign etiology.  Tobacco use-corrected tobacco use in epic as it is much more extensive than was indicated.  Not a 50-pack-year history of smoking.  Currently smoking about 1/4 pack/day.  And says the importance of smoking cessation.  Needs continued annual follow-up for lung cancer screening.  Can follow-up posterior mediastinal mass at same time.  Plan: Return in 1 year with CT chest  I spent over  20 minutes in review of records, images, and in consultation with Ms. Yunker today. Loreli Slot, MD [facility] [phone]

## 2022-12-19 ENCOUNTER — Encounter: Payer: Self-pay | Admitting: Neurology

## 2022-12-25 ENCOUNTER — Other Ambulatory Visit: Payer: Medicaid Other

## 2023-01-03 ENCOUNTER — Ambulatory Visit: Payer: Medicaid Other | Admitting: Physician Assistant

## 2023-01-16 ENCOUNTER — Ambulatory Visit (HOSPITAL_COMMUNITY)
Admission: RE | Admit: 2023-01-16 | Discharge: 2023-01-16 | Disposition: A | Discharge status: Home / Self Care | Payer: Medicaid Other | Source: Ambulatory Visit | Attending: Vascular Surgery | Admitting: Vascular Surgery

## 2023-01-16 ENCOUNTER — Encounter: Payer: Self-pay | Admitting: Vascular Surgery

## 2023-01-16 ENCOUNTER — Ambulatory Visit: Payer: Medicaid Other | Admitting: Vascular Surgery

## 2023-01-16 ENCOUNTER — Other Ambulatory Visit: Payer: Self-pay | Admitting: Vascular Surgery

## 2023-01-16 VITALS — BP 123/90 | HR 77 | Temp 98.3°F | Resp 20 | Ht 65.0 in | Wt 138.0 lb

## 2023-01-16 DIAGNOSIS — I739 Peripheral vascular disease, unspecified: Secondary | ICD-10-CM

## 2023-01-16 LAB — VAS US ABI WITH/WO TBI
Left ABI: 0.56
Right ABI: 0.7

## 2023-01-16 MED ORDER — CILOSTAZOL 100 MG PO TABS: 100.0000 mg | ORAL_TABLET | Freq: Two times a day (BID) | ORAL | 3 refills | Status: DC

## 2023-01-16 NOTE — Progress Notes (Signed)
Patient ID: Brooke Chapman, female   DOB: 1960/06/19, 62 y.o.   MRN: [id number]  Reason for Consult: Follow-up   Referred by Marcine Matar, MD  Subjective:     HPI:  Brooke ZIRKELBACH is a 62 year old female with history of bilateral common femoral endarterectomies for rest pain in the left common iliac artery stent prior for life-limiting claudication.  She is doing well today without any return of symptoms.  She does have calluses on the right foot which cause her pain but she denies any rest pain at this time and works on her feet in the cafeteria at [employer] without any claudication symptoms.  She continues to smoke unfortunately which we discussed today.  She takes aspirin and statin and Pletal which she is requesting refill for today.  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 Dorris Fetch, patient opted for surveillance imaging (12/12/21)   PAD (peripheral artery disease) (HCC)    Porokeratosis 08/26/2018   Pre-diabetes    PVD (peripheral vascular disease) (HCC) 08/26/2018   Skin lesion 05/26/2021   Tobacco dependence 05/01/2018   Unintended weight loss 01/13/2020   Family History  Problem Relation Age of Onset   Heart disease Mother    Hypertension Mother    Hyperlipidemia Mother    Hypertension Brother    Heart disease Maternal Aunt    Hypertension Maternal Aunt    Hyperlipidemia Maternal Aunt    Colon cancer Neg Hx    Liver cancer Neg Hx    Rectal cancer Neg Hx     Stomach cancer Neg Hx    Past Surgical History:  Procedure Laterality Date   ABDOMINAL AORTOGRAM W/LOWER EXTREMITY Bilateral 07/13/2019   Procedure: ABDOMINAL AORTOGRAM W/LOWER EXTREMITY;  Surgeon: Maeola Harman, MD;  Location: [facility] INVASIVE CV LAB;  Service: Cardiovascular;  Laterality: Bilateral;   ABDOMINAL AORTOGRAM W/LOWER EXTREMITY N/A 01/08/2022   Procedure: ABDOMINAL AORTOGRAM W/LOWER EXTREMITY;  Surgeon: Victorino Sparrow, MD;  Location: [facility] INVASIVE CV LAB;  Service: Vascular;  Laterality: N/A;   BREAST EXCISIONAL BIOPSY Left    LESS THAN 5 YEARS AGO   COLONOSCOPY     ENDARTERECTOMY FEMORAL Right 02/20/2022   Procedure: RIGHT COMMON FEMORAL ENDARTERECTOMY WITH VEIN PATCH;  Surgeon: Maeola Harman, MD;  Location: [facility] OR;  Service: Vascular;  Laterality: Right;   ENDARTERECTOMY FEMORAL Left 08/10/2022   Procedure: LEFT COMMON FEMORAL ENDARTERECTOMY;  Surgeon: Maeola Harman, MD;  Location: [facility] OR;  Service: Vascular;  Laterality: Left;   PERIPHERAL VASCULAR INTERVENTION Left 07/13/2019   Procedure: PERIPHERAL VASCULAR INTERVENTION;  Surgeon: Maeola Harman, MD;  Location: [facility] INVASIVE CV LAB;  Service: Cardiovascular;  Laterality: Left;  common iliac    Short Social History:  Social History   Tobacco Use   Smoking status: Every Day    Current packs/day: 1.00    Average packs/day: 1 pack/day for 50.0 years (50.0 ttl pk-yrs)    Types: Cigarettes  Start date: 1975   Smokeless tobacco: Never   Tobacco comments:    5 cigs daily  Substance Use Topics   Alcohol use: Yes    Alcohol/week: 2.0 standard drinks of alcohol    Types: 2 Standard drinks or equivalent per week    Comment: occasional    Allergies  Allergen Reactions   Latex Rash    Current Outpatient Medications  Medication Sig Dispense Refill   amLODipine (NORVASC) 10 MG tablet Take 1 tablet (10 mg total) by mouth daily. 30 tablet 2   aspirin 81 MG chewable tablet Chew 1  tablet (81 mg total) by mouth daily. 100 tablet 1   atorvastatin (LIPITOR) 40 MG tablet TAKE 1 TABLET BY MOUTH ONCE DAILY. REFILLS MUST COME FROM PCP OR CARDIOLOGY 30 tablet 4   Blood Pressure Monitor DEVI Use as directed to check home blood pressure 2-3 times a week 1 Device 0   carvedilol (COREG) 25 MG tablet Take 1 tablet (25 mg total) by mouth 2 (two) times daily. 180 tablet 3   cilostazol (PLETAL) 100 MG tablet Take 1 tablet (100 mg total) by mouth 2 (two) times daily. 180 tablet 3   ferrous sulfate 325 (65 FE) MG tablet Take 1 tablet (325 mg total) by mouth 2 (two) times daily with a meal. (Patient taking differently: Take 325 mg by mouth once a week.) 100 tablet 1   Multiple Vitamins-Minerals (HAIR SKIN AND NAILS FORMULA) TABS Take 2 tablets by mouth daily.     potassium chloride (KLOR-CON) 8 MEQ tablet Take 2 tablets (16 mEq total) by mouth daily. 180 tablet 1   topiramate (TOPAMAX) 25 MG tablet Take 1 tablet (25 mg total) by mouth at bedtime. 30 tablet 2   valsartan-hydrochlorothiazide (DIOVAN-HCT) 320-25 MG tablet Take 1 tablet by mouth once daily 90 tablet 0   nitroGLYCERIN (NITROSTAT) 0.4 MG SL tablet Place 1 tablet (0.4 mg total) under the tongue every 5 (five) minutes as needed. 25 tablet 6   Current Facility-Administered Medications  Medication Dose Route Frequency Provider Last Rate Last Admin   cloNIDine (CATAPRES) tablet 0.1 mg  0.1 mg Oral Once Anders Simmonds, PA-C        Review of Systems  Constitutional:  Constitutional negative. HENT: HENT negative.  Eyes: Eyes negative.  Respiratory: Respiratory negative.  Cardiovascular: Cardiovascular negative.  GI: Gastrointestinal negative.  Musculoskeletal:       Foot pain with calluses on right Skin: Skin negative.  Neurological: Neurological negative. Hematologic: Hematologic/lymphatic negative.  Psychiatric: Psychiatric negative.        Objective:  Objective   Vitals:   01/16/23 1336  BP: (!) 123/90  Pulse: 77   Resp: 20  Temp: 98.3 F (36.8 C)  SpO2: 98%  Weight: 138 lb (62.6 kg)  Height: 5\' 5"  (1.651 m)   Body mass index is 22.96 kg/m.  Physical Exam HENT:     Head: Normocephalic.     Nose: Nose normal.  Eyes:     Pupils: Pupils are equal, round, and reactive to light.  Cardiovascular:     Pulses:          Femoral pulses are 2+ on the right side and 2+ on the left side.      Popliteal pulses are 0 on the right side and 0 on the left side.  Pulmonary:     Effort: Pulmonary effort is normal.  Abdominal:     General: Abdomen is flat.  Skin:    Capillary Refill:  Capillary refill takes less than 2 seconds.  Neurological:     Mental Status: She is alert.  Psychiatric:        Mood and Affect: Mood normal.     Data: ABI Findings:  +---------+------------------+-----+----------+--------+  Right   Rt Pressure (mmHg)IndexWaveform  Comment   +---------+------------------+-----+----------+--------+  Brachial 149                                        +---------+------------------+-----+----------+--------+  PTA     88                0.59 monophasic          +---------+------------------+-----+----------+--------+  DP      104               0.70 monophasic          +---------+------------------+-----+----------+--------+  Great Toe24                0.16 Abnormal            +---------+------------------+-----+----------+--------+   +---------+------------------+-----+----------+-------+  Left    Lt Pressure (mmHg)IndexWaveform  Comment  +---------+------------------+-----+----------+-------+  Brachial 134                                       +---------+------------------+-----+----------+-------+  PTA     84                0.56 monophasic         +---------+------------------+-----+----------+-------+  DP      81                0.54 monophasic         +---------+------------------+-----+----------+-------+  Great Toe21                 0.14 Abnormal           +---------+------------------+-----+----------+-------+   +-------+-----------+-----------+------------+------------+  ABI/TBIToday's ABIToday's TBIPrevious ABIPrevious TBI  +-------+-----------+-----------+------------+------------+  Right 0.70       0.16       0.59        0.44          +-------+-----------+-----------+------------+------------+  Left  0.56       0.14       0.51        absent        +-------+-----------+-----------+------------+------------+         Right ABIs appear increased. Right TBIs appear decreased. Left ABI is  essentially unchanged. Left TBI is increased.         Assessment/Plan:     62 year old female status post bilateral common femoral endarterectomies for rest pain with a history of the left common iliac artery stent as well with bounding femoral pulses but still has decreased ABIs with toe pressures in the 20s bilaterally which remain asymptomatic.  We discussed following her up in 6 more months with repeat studies.  We again discussed smoking cessation but seems unlikely.       Maeola Harman MD [facility]

## 2023-01-22 ENCOUNTER — Encounter: Payer: Self-pay | Admitting: Physician Assistant

## 2023-01-22 ENCOUNTER — Ambulatory Visit: Payer: Medicaid Other | Attending: Physician Assistant | Admitting: Physician Assistant

## 2023-01-22 VITALS — BP 148/84 | HR 64 | Wt 145.4 lb

## 2023-01-22 DIAGNOSIS — E876 Hypokalemia: Secondary | ICD-10-CM | POA: Diagnosis not present

## 2023-01-22 DIAGNOSIS — I10 Essential (primary) hypertension: Secondary | ICD-10-CM

## 2023-01-22 DIAGNOSIS — G43109 Migraine with aura, not intractable, without status migrainosus: Secondary | ICD-10-CM | POA: Diagnosis not present

## 2023-01-22 DIAGNOSIS — R5383 Other fatigue: Secondary | ICD-10-CM | POA: Diagnosis not present

## 2023-01-22 DIAGNOSIS — D508 Other iron deficiency anemias: Secondary | ICD-10-CM | POA: Diagnosis not present

## 2023-01-22 DIAGNOSIS — E559 Vitamin D deficiency, unspecified: Secondary | ICD-10-CM | POA: Diagnosis not present

## 2023-01-22 MED ORDER — VALSARTAN-HYDROCHLOROTHIAZIDE 320-25 MG PO TABS: 1.0000 | ORAL_TABLET | Freq: Every day | ORAL | 1 refills | Status: DC

## 2023-01-22 MED ORDER — TOPIRAMATE 25 MG PO TABS: 25.0000 mg | ORAL_TABLET | Freq: Every day | ORAL | 2 refills | Status: DC

## 2023-01-22 MED ORDER — POTASSIUM CHLORIDE ER 8 MEQ PO TBCR: 16.0000 meq | EXTENDED_RELEASE_TABLET | Freq: Every day | ORAL | 1 refills | Status: DC

## 2023-01-22 NOTE — Progress Notes (Signed)
Patient ID: Brooke Chapman, female   DOB: [birth year], 62 y.o.   MRN: [id number]   Dabney Gitchell, is a 62 y.o. female  BJY:[id number]  [id number]  DOB - 1960-03-20  Chief Complaint  Patient presents with   Medication Refill   Hypertension       Subjective:   Brooke Chapman is a 62 y.o. female here today for med RF.  She also wants to recheck vitamin D deficiency bc she was on prescription strength for a while and it hasn't been checked in a while.  Also wants vitamin D and folate checked bc feeling tired.  She did have to have a transfusion in the summer when she had to have surgery for PAD.  She is not compliant with iron which she is supposed to be taking at least once a week.  She has not had any melena or hematochezia.   No problems updated.  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 Dorris Fetch, patient opted for surveillance imaging (12/12/21)   PAD (peripheral artery disease) (HCC)    Porokeratosis 08/26/2018   Pre-diabetes    PVD (peripheral vascular disease) (HCC) 08/26/2018   Skin lesion 05/26/2021   Tobacco dependence 05/01/2018   Unintended weight loss 01/13/2020    MEDICATIONS AT HOME: Prior to Admission medications   Medication Sig Start Date End Date Taking? Authorizing Provider  amLODipine (NORVASC) 10 MG tablet Take 1 tablet (10 mg total) by mouth daily. 10/09/22  Yes Marcine Matar, MD   aspirin 81 MG chewable tablet Chew 1 tablet (81 mg total) by mouth daily. 11/09/20  Yes Osei Anger M, PA-C  atorvastatin (LIPITOR) 40 MG tablet TAKE 1 TABLET BY MOUTH ONCE DAILY. REFILLS MUST COME FROM PCP OR CARDIOLOGY 11/28/22  Yes Marcine Matar, MD  Blood Pressure Monitor DEVI Use as directed to check home blood pressure 2-3 times a week 07/28/18  Yes Marcine Matar, MD  carvedilol (COREG) 25 MG tablet Take 1 tablet (25 mg total) by mouth 2 (two) times daily. 02/08/22  Yes Marcine Matar, MD  cilostazol (PLETAL) 100 MG tablet Take 1 tablet (100 mg total) by mouth 2 (two) times daily. 01/16/23  Yes Maeola Harman, MD  ferrous sulfate 325 (65 FE) MG tablet Take 1 tablet (325 mg total) by mouth 2 (two) times daily with a meal. Patient taking differently: Take 325 mg by mouth once a week. 12/01/19  Yes Marcine Matar, MD  Multiple Vitamins-Minerals (HAIR SKIN AND NAILS FORMULA) TABS Take 2 tablets by mouth daily.   Yes [provider]  nitroGLYCERIN (NITROSTAT) 0.4 MG SL tablet Place 1 tablet (0.4 mg total) under the tongue every 5 (five) minutes as needed. 06/15/21 07/30/22  Revankar, Aundra Dubin, MD  potassium chloride (KLOR-CON) 8 MEQ tablet Take 2 tablets (16 mEq total) by mouth daily. 01/22/23   Anders Simmonds,  PA-C  topiramate (TOPAMAX) 25 MG tablet Take 1 tablet (25 mg total) by mouth at bedtime. 01/22/23   Anders Simmonds, PA-C  valsartan-hydrochlorothiazide (DIOVAN-HCT) 320-25 MG tablet Take 1 tablet by mouth daily. 01/22/23   Orie Cuttino, Marzella Schlein, PA-C    ROS: Neg HEENT Neg resp Neg cardiac Neg GI Neg GU Neg MS Neg psych Neg neuro  Objective:   Vitals:   01/22/23 1038 01/22/23 1055  BP: (!) 157/91 (!) 148/84  Pulse: 64   SpO2: 100%   Weight: 145 lb 6.4 oz (66 kg)    Exam General appearance : Awake, alert, not in any distress. Speech Clear. Not toxic looking HEENT: Atraumatic and Normocephalic Neck: Supple, no JVD. No cervical  lymphadenopathy.  Chest: Good air entry bilaterally, CTAB.  No rales/rhonchi/wheezing CVS: S1 S2 regular, no murmurs.  Extremities: B/L Lower Ext shows no edema, both legs are warm to touch Neurology: Awake alert, and oriented X 3, CN II-XII intact, Non focal Skin: No Rash  Data Review Lab Results  Component Value Date   HGBA1C 5.7 (H) 11/10/2021   HGBA1C 5.3 (A) 04/29/2020   HGBA1C 5.8 (H) 11/26/2019    Assessment & Plan   1. Migraine with aura and without status migrainosus, not intractable stable - topiramate (TOPAMAX) 25 MG tablet; Take 1 tablet (25 mg total) by mouth at bedtime.  Dispense: 30 tablet; Refill: 2  2. Hypokalemia - potassium chloride (KLOR-CON) 8 MEQ tablet; Take 2 tablets (16 mEq total) by mouth daily.  Dispense: 180 tablet; Refill: 1  3. Other fatigue (Primary) - B12 and Folate Panel  4. Vitamin D deficiency - Vitamin D, 25-hydroxy  5. Hypertension, unspecified type Continue current regimen. blood pressure goal at home is <130/85.  Check blood pressure several times a week.  Sit still and quiet and take deep breaths prior to checking it.  Bring recordings to next visit - valsartan-hydrochlorothiazide (DIOVAN-HCT) 320-25 MG tablet; Take 1 tablet by mouth daily.  Dispense: 90 tablet; Refill: 1  6. Other iron deficiency anemia Take iron as directed - CBC with Differential    Return in about 4 months (around 05/23/2023) for PCP for chronic conditions-[place].  The patient was given clear instructions to go to ER or return to medical center if symptoms don't improve, worsen or new problems develop. The patient verbalized understanding. The patient was told to call to get lab results if they haven't heard anything in the next week.      Georgian Co, PA-C [facility] and [facility] [place], [place] [phone]   01/22/2023, 11:01 AM

## 2023-01-22 NOTE — Patient Instructions (Signed)
blood pressure goal at home is <130/85.  Check blood pressure several times a week.  Sit still and quiet and take deep breaths prior to checking it.  Bring recordings to next visit

## 2023-01-23 LAB — CBC WITH DIFFERENTIAL/PLATELET
Basophils Absolute: 0 10*3/uL (ref 0.0–0.2)
Basos: 0 %
EOS (ABSOLUTE): 0.1 10*3/uL (ref 0.0–0.4)
Eos: 2 %
Hematocrit: 39.1 % (ref 34.0–46.6)
Hemoglobin: 12.9 g/dL (ref 11.1–15.9)
Immature Grans (Abs): 0 10*3/uL (ref 0.0–0.1)
Immature Granulocytes: 0 %
Lymphocytes Absolute: 2.3 10*3/uL (ref 0.7–3.1)
Lymphs: 49 %
MCH: 32.2 pg (ref 26.6–33.0)
MCHC: 33 g/dL (ref 31.5–35.7)
MCV: 98 fL — ABNORMAL HIGH (ref 79–97)
Monocytes Absolute: 0.4 10*3/uL (ref 0.1–0.9)
Monocytes: 10 %
Neutrophils Absolute: 1.8 10*3/uL (ref 1.4–7.0)
Neutrophils: 39 %
Platelets: 220 10*3/uL (ref 150–450)
RBC: 4.01 x10E6/uL (ref 3.77–5.28)
RDW: 13.6 % (ref 11.7–15.4)
WBC: 4.6 10*3/uL (ref 3.4–10.8)

## 2023-01-23 LAB — B12 AND FOLATE PANEL
Folate: 12.7 ng/mL (ref >3.0)
Vitamin B-12: 648 pg/mL (ref 232–1245)

## 2023-01-23 LAB — VITAMIN D 25 HYDROXY (VIT D DEFICIENCY, FRACTURES): Vit D, 25-Hydroxy: 22.1 ng/mL — ABNORMAL LOW (ref 30.0–100.0)

## 2023-01-25 ENCOUNTER — Other Ambulatory Visit: Payer: Self-pay

## 2023-01-25 ENCOUNTER — Other Ambulatory Visit: Payer: Self-pay | Admitting: Physician Assistant

## 2023-01-25 DIAGNOSIS — E559 Vitamin D deficiency, unspecified: Secondary | ICD-10-CM

## 2023-01-25 DIAGNOSIS — I739 Peripheral vascular disease, unspecified: Secondary | ICD-10-CM

## 2023-01-25 MED ORDER — VITAMIN D (ERGOCALCIFEROL) 1.25 MG (50000 UNIT) PO CAPS: 50000.0000 [IU] | ORAL_CAPSULE | ORAL | 0 refills | Status: DC

## 2023-02-12 ENCOUNTER — Encounter: Payer: Self-pay | Admitting: Neurology

## 2023-03-01 ENCOUNTER — Other Ambulatory Visit: Payer: Self-pay | Admitting: Physician Assistant

## 2023-03-01 DIAGNOSIS — I10 Essential (primary) hypertension: Secondary | ICD-10-CM

## 2023-03-01 NOTE — Telephone Encounter (Signed)
Requested Prescriptions  Pending Prescriptions Disp Refills   carvedilol (COREG) 25 MG tablet [Pharmacy Med Name: Carvedilol 25 MG Oral Tablet] 180 tablet 1    Sig: Take 1 tablet by mouth twice daily     Cardiovascular: Beta Blockers 3 Failed - 03/01/2023 12:09 PM      Failed - Cr in normal range and within 360 days    Creatinine, Ser  Date Value Ref Range Status  08/11/2022 1.29 (H) 0.44 - 1.00 mg/dL Final         Failed - Last BP in normal range    BP Readings from Last 1 Encounters:  01/22/23 (!) 148/84         Passed - AST in normal range and within 360 days    AST  Date Value Ref Range Status  07/31/2022 25 15 - 41 U/L Final         Passed - ALT in normal range and within 360 days    ALT  Date Value Ref Range Status  07/31/2022 14 0 - 44 U/L Final         Passed - Last Heart Rate in normal range    Pulse Readings from Last 1 Encounters:  01/22/23 64         Passed - Valid encounter within last 6 months    Recent Outpatient Visits           1 month ago Other fatigue   [facility] [place] [facility] [place]. [facility], Marylene Land M, [place]   7 months ago Essential hypertension   [facility] [facility] Of [place]. [facility] Drucilla Chalet, RPH-CPP   8 months ago Essential hypertension   [facility] [facility] Of [place]. [facility] Marcine Matar, MD   1 year ago Essential hypertension   [facility] [place] [place] - [facility] Of [place]. [facility] Marcine Matar, MD   1 year ago Essential hypertension   [facility] [place] [facility] Of [place]. [facility] Drucilla Chalet, RPH-CPP       Future Appointments             In 2 months Laural Benes, Binnie Rail, MD [facility] - A Dept Of Eligha Bridegroom. [facility]

## 2023-03-12 DIAGNOSIS — L03012 Cellulitis of left finger: Secondary | ICD-10-CM | POA: Diagnosis not present

## 2023-03-20 ENCOUNTER — Other Ambulatory Visit: Payer: Self-pay | Admitting: Internal Medicine

## 2023-03-20 DIAGNOSIS — I10 Essential (primary) hypertension: Secondary | ICD-10-CM

## 2023-04-04 ENCOUNTER — Other Ambulatory Visit: Payer: Self-pay | Admitting: Internal Medicine

## 2023-04-04 DIAGNOSIS — I10 Essential (primary) hypertension: Secondary | ICD-10-CM

## 2023-05-21 NOTE — Progress Notes (Deleted)
 NEUROLOGY FOLLOW UP OFFICE NOTE  Brooke Chapman [id number]  Assessment/Plan:   New onset headache over 50, query cluster headache vs migraine.      As these are new headaches, secondary etiologies must be ruled out:  MRI brain with and without contrast and MRA head Advised to take topiramate  25mg  every night at bedtime (not as needed).  If no improvement in 4 weeks, increase to 50mg  at bedtime *** Keep headache diary Follow up with PCP regarding blood pressure Follow up with me in 5-6 months.   Subjective:  Brooke Chapman is a 63 year old right-handed female with HTN, HLD, CKD, iron-deficiency anemia, PAD and GAD who follows up for headaches.  UPDATE: Labs from 11/08/2022 revealed sed rate 27 and CRP <1.0. MRI and MRA of head ordered but not performed.  ***  Intensity:  *** Duration:  *** Frequency:  *** Frequency of abortive medication: *** Current NSAIDS/analgesics:  ASA 81mg  daily Current triptans:  none Current ergotamine:  none Current anti-emetic:  none Current muscle relaxants:  none Current Antihypertensive medications:  carvedilol , amlodipine , valsartan -HCTZ Current Antidepressant medications:  none Current Anticonvulsant medications:  topiramate  25mg  at bedtime  Current anti-CGRP:  none Current Vitamins/Herbal/Supplements:  ferrous sulfate , MVI, KCl Current Antihistamines/Decongestants:  none Other therapy:  ice pack Other medications:  atorvastatin , Pletal , NTG  HISTORY: Onset:  April-May 2024.   Location:  left retro-orbital/left fronto-temporal Quality:  throbbing Intensity:  10/10.  She denies new headache, thunderclap headache or severe headache that wakes her from sleep. Aura:  absent Prodrome:  absent Associated symptoms:  Photophobia, phonophobia, eye lacrimation, conjunctival injection (unsure if there is ptosis).  She denies associated nausea, unilateral numbness or weakness.  Unsure if she has visual disturbance Duration:  an hour but goes to  sleep.  Occurs at night 8-9 PM Frequency:  varies.  On average 3 times a week Triggers:  unsure.  Have not noticed if alcohol triggers it.   Relieving factors:  cold pack Activity:  can't move.  Needs to be still  Past NSAIDS/analgesics:  Tylenol  Past abortive triptans:  none Past abortive ergotamine:  none Past muscle relaxants:  none Past anti-emetic:  none Past antihypertensive medications:  clonidine , valsartan -hydrochlorothiazide , losartan  Past antidepressant medications:  sertraline , bupropion  Past anticonvulsant medications:  none Past anti-CGRP:  none    No prior history of headaches  PAST MEDICAL HISTORY: Past Medical History:  Diagnosis Date   Anemia    Antiplatelet or antithrombotic long-term use 01/13/2020   Aortic stenosis 07/05/2021   mild AS by echo   Black stools 01/13/2020   CKD (chronic kidney disease)    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 Luna Salinas, patient opted for surveillance imaging (12/12/21)   PAD (peripheral artery disease) (HCC)    Porokeratosis 08/26/2018   Pre-diabetes    PVD (peripheral vascular disease) (HCC) 08/26/2018   Skin lesion 05/26/2021   Tobacco dependence 05/01/2018   Unintended weight loss 01/13/2020    MEDICATIONS: Current Outpatient Medications on File Prior to Visit  Medication Sig Dispense Refill   amLODipine  (NORVASC ) 10 MG tablet Take 1 tablet by mouth once daily 30 tablet 1   aspirin  81 MG chewable tablet Chew 1 tablet (  81 mg total) by mouth daily. 100 tablet 1   atorvastatin  (LIPITOR) 40 MG tablet TAKE 1 TABLET BY MOUTH ONCE DAILY. REFILLS MUST COME FROM PCP OR CARDIOLOGY 30 tablet 4   Blood Pressure Monitor DEVI Use as directed to  check home blood pressure 2-3 times a week 1 Device 0   carvedilol  (COREG ) 25 MG tablet Take 1 tablet by mouth twice daily 180 tablet 1   cilostazol  (PLETAL ) 100 MG tablet Take 1 tablet (100 mg total) by mouth 2 (two) times daily. 180 tablet 3   ferrous sulfate  325 (65 FE) MG tablet Take 1 tablet (325 mg total) by mouth 2 (two) times daily with a meal. (Patient taking differently: Take 325 mg by mouth once a week.) 100 tablet 1   Multiple Vitamins-Minerals (HAIR SKIN AND NAILS FORMULA) TABS Take 2 tablets by mouth daily.     nitroGLYCERIN  (NITROSTAT ) 0.4 MG SL tablet Place 1 tablet (0.4 mg total) under the tongue every 5 (five) minutes as needed. 25 tablet 6   potassium chloride  (KLOR-CON ) 8 MEQ tablet Take 2 tablets (16 mEq total) by mouth daily. 180 tablet 1   topiramate  (TOPAMAX ) 25 MG tablet Take 1 tablet (25 mg total) by mouth at bedtime. 30 tablet 2   valsartan -hydrochlorothiazide  (DIOVAN -HCT) 320-25 MG tablet Take 1 tablet by mouth daily. 90 tablet 1   Vitamin D , Ergocalciferol , (DRISDOL ) 1.25 MG (50000 UNIT) CAPS capsule Take 1 capsule (50,000 Units total) by mouth every 7 (seven) days. 16 capsule 0   Current Facility-Administered Medications on File Prior to Visit  Medication Dose Route Frequency Provider Last Rate Last Admin   cloNIDine  (CATAPRES ) tablet 0.1 mg  0.1 mg Oral Once McClung, Angela M, PA-C        ALLERGIES: Allergies  Allergen Reactions   Latex Rash    FAMILY HISTORY: Family History  Problem Relation Age of Onset   Heart disease Mother    Hypertension Mother    Hyperlipidemia Mother    Hypertension Brother    Heart disease Maternal Aunt    Hypertension Maternal Aunt    Hyperlipidemia Maternal Aunt    Colon cancer Neg Hx    Liver cancer Neg Hx    Rectal cancer Neg Hx    Stomach cancer Neg Hx       Objective:  *** General: No acute distress.  Patient appears ***-groomed.   Head:  Normocephalic/atraumatic Eyes:  Fundi examined but not visualized Neck:  supple, no paraspinal tenderness, full range of motion Heart:  Regular rate and rhythm Lungs:  Clear to auscultation bilaterally Back: No paraspinal tenderness Neurological Exam: alert and oriented.  Speech fluent and not dysarthric, language intact.  CN II-XII intact. Bulk and tone normal, muscle strength 5/5 throughout.  Sensation to light touch intact.  Deep tendon reflexes 2+ throughout, toes downgoing.  Finger to nose testing intact.  Gait normal, Romberg negative.   Janne Members, DO  CC: ***

## 2023-05-22 ENCOUNTER — Ambulatory Visit: Payer: Medicaid Other | Admitting: Neurology

## 2023-05-23 ENCOUNTER — Ambulatory Visit: Payer: Medicaid Other | Attending: Internal Medicine | Admitting: Internal Medicine

## 2023-05-23 ENCOUNTER — Encounter: Payer: Self-pay | Admitting: Internal Medicine

## 2023-05-23 VITALS — BP 117/78 | HR 67 | Temp 98.1°F | Ht 65.0 in | Wt 145.0 lb

## 2023-05-23 DIAGNOSIS — Z2821 Immunization not carried out because of patient refusal: Secondary | ICD-10-CM

## 2023-05-23 DIAGNOSIS — J9859 Other diseases of mediastinum, not elsewhere classified: Secondary | ICD-10-CM

## 2023-05-23 DIAGNOSIS — N1832 Chronic kidney disease, stage 3b: Secondary | ICD-10-CM

## 2023-05-23 DIAGNOSIS — D508 Other iron deficiency anemias: Secondary | ICD-10-CM | POA: Diagnosis not present

## 2023-05-23 DIAGNOSIS — I10 Essential (primary) hypertension: Secondary | ICD-10-CM

## 2023-05-23 DIAGNOSIS — Z780 Asymptomatic menopausal state: Secondary | ICD-10-CM

## 2023-05-23 DIAGNOSIS — I739 Peripheral vascular disease, unspecified: Secondary | ICD-10-CM

## 2023-05-23 DIAGNOSIS — L930 Discoid lupus erythematosus: Secondary | ICD-10-CM | POA: Diagnosis not present

## 2023-05-23 DIAGNOSIS — I129 Hypertensive chronic kidney disease with stage 1 through stage 4 chronic kidney disease, or unspecified chronic kidney disease: Secondary | ICD-10-CM | POA: Diagnosis not present

## 2023-05-23 DIAGNOSIS — F17210 Nicotine dependence, cigarettes, uncomplicated: Secondary | ICD-10-CM | POA: Diagnosis not present

## 2023-05-23 DIAGNOSIS — F17200 Nicotine dependence, unspecified, uncomplicated: Secondary | ICD-10-CM

## 2023-05-23 MED ORDER — AMLODIPINE BESYLATE 10 MG PO TABS: 10.0000 mg | ORAL_TABLET | Freq: Every day | ORAL | 1 refills | Status: DC

## 2023-05-23 NOTE — Progress Notes (Signed)
 Patient ID: Brooke Chapman, female    DOB: 12/16/1960  MRN: [id number]  CC: Hypertension (HTN f/u. Med refill. [facility] routine blood work /)   Subjective: Bro[facility] is a 63 y.o. female who presents for chronic ds management. Her concerns today include:  Patient with history of PAD (s/P RT CFA endarterectomy), aortic atherosclerosis, emphysema changes seen on CT 05/2020, HTN, tob dep, HL, urge incont, IDA, RT LLL nodule and posterior mediastinal mass on CT 05/2020 (followed by CT surgeon [facility], questionably benign schwannoma.  Patient not wanting biopsy.  Plan to monitor with repeat imaging 09/2022), left renal mass (Saw urologist [facility] 01/26/2022.  Given stability in size, he thinks it is likely benign).   Discussed the use of AI scribe software for clinical note transcription with the patient, who gave verbal consent to proceed.  History of Present Illness Ms. Brooke Chapman, a patient with a history of hypertension, chronic kidney disease, peripheral artery disease, and a mediastinal mass, presents for a routine follow-up.   HTN/CKD/PAD: She reports no chest pain, shortness of breath, or leg swelling. She is currently taking carvedilol  25mg  twice daily, amlodipine  10mg  daily, Diovan /hydrochlorothiazide  320/25mg  daily, and potassium supplement 8 milliequivalents 2 daily. She is also on cilostazol  (Pletal ) twice daily for peripheral artery disease. She is compliant with her medications and has been limiting her salt intake. -She has a history of surgeries on both legs due to peripheral artery disease and reports no pain in her legs at rest or when walking.  Saw her vascular surgeon Dr. Vikki Graves in December and had ABIs that reportedly showed decreased toe pressures bilaterally but patient was asymptomatic. - Kidney function has not been 100% with GFR's in the 40s.  Last GFR in July of last year was 57.  She is not on NSAIDs.  Tobacco dependence she is a current smoker, consuming half  to one pack of cigarettes daily.  Not ready to give a trial of quitting.  Mediastinal mass: Saw [facility] in follow-up 11/2022.  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 She is due for a repeat CT scan in one year.  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  I do not think we ever got the pathology report.  I will have her sign a release today for us  to get it.  History of iron deficiency: Supposed to be taking ferrous sulfate  325 mg daily.  Instead she is taking an over-the-counter iron gummy supplement because the ferrous sulfate  tablets causes constipation for her.    HM: She is due for a bone density study to screen for osteoporosis.  She declines pneumonia vaccine and shingles vaccine.    Patient Active Problem List   Diagnosis Date Noted   Limb ischemia 08/10/2022   Migraine with aura and without status migrainosus, not intractable 06/14/2022   Lupus 02/08/2022   Kidney lesion, native, left 02/08/2022   Paraspinal mass 02/08/2022   Renal mass; left - non-enhancing, stable in size x 18 months 01/25/2022   Atrophic kidney, right 01/25/2022   CKD (chronic kidney disease) stage 3, GFR 30-59 ml/min (HCC) 11/11/2021   Coronary artery disease 06/15/2021   Chest pain of uncertain etiology 06/15/2021   Cardiac murmur 06/15/2021  Anemia 06/13/2021   Skin lesion 05/26/2021   GAD (generalized anxiety disorder) 05/01/2020   Black stools 01/13/2020   Unintended weight loss 01/13/2020   Antiplatelet or antithrombotic long-term use 01/13/2020   Hypertension    Frontal balding 12/28/2019   Iron deficiency anemia 12/28/2019   Essential hypertension    Porokeratosis 08/26/2018   PVD (peripheral vascular disease) (HCC) 08/26/2018    Hyperlipidemia 05/02/2018   HTN (hypertension) 05/01/2018   Tobacco dependence 05/01/2018   PAD (peripheral artery disease) (HCC)      Current Outpatient Medications on File Prior to Visit  Medication Sig Dispense Refill   aspirin  81 MG chewable tablet Chew 1 tablet (81 mg total) by mouth daily. 100 tablet 1   atorvastatin  (LIPITOR) 40 MG tablet TAKE 1 TABLET BY MOUTH ONCE DAILY. REFILLS MUST COME FROM PCP OR CARDIOLOGY 30 tablet 4   Blood Pressure Monitor DEVI Use as directed to check home blood pressure 2-3 times a week 1 Device 0   carvedilol  (COREG ) 25 MG tablet Take 1 tablet by mouth twice daily 180 tablet 1   cilostazol  (PLETAL ) 100 MG tablet Take 1 tablet (100 mg total) by mouth 2 (two) times daily. 180 tablet 3   ferrous sulfate  325 (65 FE) MG tablet Take 1 tablet (325 mg total) by mouth 2 (two) times daily with a meal. (Patient taking differently: Take 325 mg by mouth once a week.) 100 tablet 1   Multiple Vitamins-Minerals (HAIR SKIN AND NAILS FORMULA) TABS Take 2 tablets by mouth daily.     potassium chloride  (KLOR-CON ) 8 MEQ tablet Take 2 tablets (16 mEq total) by mouth daily. 180 tablet 1   valsartan -hydrochlorothiazide  (DIOVAN -HCT) 320-25 MG tablet Take 1 tablet by mouth daily. 90 tablet 1   Vitamin D , Ergocalciferol , (DRISDOL ) 1.25 MG (50000 UNIT) CAPS capsule Take 1 capsule (50,000 Units total) by mouth every 7 (seven) days. 16 capsule 0   nitroGLYCERIN  (NITROSTAT ) 0.4 MG SL tablet Place 1 tablet (0.4 mg total) under the tongue every 5 (five) minutes as needed. 25 tablet 6   topiramate  (TOPAMAX ) 25 MG tablet Take 1 tablet (25 mg total) by mouth at bedtime. (Patient not taking: Reported on 05/23/2023) 30 tablet 2   Current Facility-Administered Medications on File Prior to Visit  Medication Dose Route Frequency Provider Last Rate Last Admin   cloNIDine  (CATAPRES ) tablet 0.1 mg  0.1 mg Oral Once Jonelle Neri, Angela M, PA-C        Allergies  Allergen Reactions   Latex Rash     Social History   Socioeconomic History   Marital status: Single    Spouse name: Not on file   Number of children: 0   Years of education: Not on file   Highest education level: GED or equivalent  Occupational History   Occupation: Prep cook  Tobacco Use   Smoking status: Every Day    Current packs/day: 1.00    Average packs/day: 1 pack/day for 50.3 years (50.3 ttl pk-yrs)    Types: Cigarettes    Start date: 1975   Smokeless tobacco: Never   Tobacco comments:    5 cigs daily  Vaping Use   Vaping status: Never Used  Substance and Sexual Activity   Alcohol use: Yes    Alcohol/week: 2.0 standard drinks of alcohol    Types: 2 Standard drinks or equivalent per week    Comment: occasional   Drug use: Never   Sexual activity: Not Currently  Other Topics Concern   Not on  file  Social History Narrative   Are you right handed or left handed? Right   Are you currently employed ? Y   What is your current occupation? [employer]   Do you live at home alone? N   Who lives with you?    What type of home do you live in: 1 story or 2 story? 1       Social Drivers of Corporate investment banker Strain: Low Risk  (05/23/2023)   Overall Financial Resource Strain (CARDIA)    Difficulty of Paying Living Expenses: Not very hard  Food Insecurity: No Food Insecurity (05/23/2023)   Hunger Vital Sign    Worried About Running Out of Food in the Last Year: Never true    Ran Out of Food in the Last Year: Never true  Transportation Needs: No Transportation Needs (05/23/2023)   PRAPARE - Administrator, Civil Service (Medical): No    Lack of Transportation (Non-Medical): No  Physical Activity: Insufficiently Active (01/19/2023)   Exercise Vital Sign    Days of Exercise per Week: 2 days    Minutes of Exercise per Session: 30 min  Stress: No Stress Concern Present (05/23/2023)   [employer] of Occupational Health - Occupational Stress Questionnaire    Feeling of Stress : Not  at all  Social Connections: Moderately Isolated (05/23/2023)   Social Connection and Isolation Panel [NHANES]    Frequency of Communication with Friends and Family: Three times a week    Frequency of Social Gatherings with Friends and Family: Twice a week    Attends Religious Services: More than 4 times per year    Active Member of [employer] or Organizations: No    Attends Banker Meetings: Never    Marital Status: Never married  Intimate Partner Violence: Not At Risk (05/23/2023)   Humiliation, Afraid, Rape, and Kick questionnaire    Fear of Current or Ex-Partner: No    Emotionally Abused: No    Physically Abused: No    Sexually Abused: No    Family History  Problem Relation Age of Onset   Heart disease Mother    Hypertension Mother    Hyperlipidemia Mother    Hypertension Brother    Heart disease Maternal Aunt    Hypertension Maternal Aunt    Hyperlipidemia Maternal Aunt    Colon cancer Neg Hx    Liver cancer Neg Hx    Rectal cancer Neg Hx    Stomach cancer Neg Hx     Past Surgical History:  Procedure Laterality Date   ABDOMINAL AORTOGRAM W/LOWER EXTREMITY Bilateral 07/13/2019   Procedure: ABDOMINAL AORTOGRAM W/LOWER EXTREMITY;  Surgeon: Adine Hoof, MD;  Location: [facility] INVASIVE CV LAB;  Service: Cardiovascular;  Laterality: Bilateral;   ABDOMINAL AORTOGRAM W/LOWER EXTREMITY N/A 01/08/2022   Procedure: ABDOMINAL AORTOGRAM W/LOWER EXTREMITY;  Surgeon: Kayla Part, MD;  Location: [facility] INVASIVE CV LAB;  Service: Vascular;  Laterality: N/A;   BREAST EXCISIONAL BIOPSY Left    LESS THAN 5 YEARS AGO   COLONOSCOPY     ENDARTERECTOMY FEMORAL Right 02/20/2022   Procedure: RIGHT COMMON FEMORAL ENDARTERECTOMY WITH VEIN PATCH;  Surgeon: Adine Hoof, MD;  Location: [facility] OR;  Service: Vascular;  Laterality: Right;   ENDARTERECTOMY FEMORAL Left 08/10/2022   Procedure: LEFT COMMON FEMORAL ENDARTERECTOMY;  Surgeon: Adine Hoof, MD;  Location:  [facility] OR;  Service: Vascular;  Laterality: Left;   PERIPHERAL VASCULAR INTERVENTION Left 07/13/2019   Procedure: PERIPHERAL VASCULAR  INTERVENTION;  Surgeon: Adine Hoof, MD;  Location: [facility] INVASIVE CV LAB;  Service: Cardiovascular;  Laterality: Left;  common iliac    ROS: Review of Systems Negative except as stated above  PHYSICAL EXAM: BP 117/78   Pulse 67   Temp 98.1 F (36.7 C) (Oral)   Ht 5\' 5"  (1.651 m)   Wt 145 lb (65.8 kg)   SpO2 98%   BMI 24.13 kg/m   Physical Exam  General appearance - alert, well appearing, and in no distress Mental status - normal mood, behavior, speech, dress, motor activity, and thought processes Neck - supple, no significant adenopathy Chest - clear to auscultation, no wheezes, rales or rhonchi, symmetric air entry Heart - normal rate, regular rhythm, normal S1, S2, no murmurs, rubs, clicks or gallops Extremities - peripheral pulses normal, no pedal edema, no clubbing or cyanosis Skin: Has scarring dermatitis over the left cheek which is chronic and appears unchanged     Latest Ref Rng & Units 08/11/2022    1:17 AM 08/10/2022    2:23 PM 07/31/2022    1:15 PM  CMP  Glucose 70 - 99 mg/dL 045   87   BUN 8 - 23 mg/dL 16   15   Creatinine 4.09 - 1.00 mg/dL 8.11  9.14  7.82   Sodium 135 - 145 mmol/L 134   136   Potassium 3.5 - 5.1 mmol/L 4.2   3.8   Chloride 98 - 111 mmol/L 102   100   CO2 22 - 32 mmol/L 24   28   Calcium  8.9 - 10.3 mg/dL 8.3   9.3   Total Protein 6.5 - 8.1 g/dL   7.9   Total Bilirubin 0.3 - 1.2 mg/dL   0.7   Alkaline Phos 38 - 126 U/L   58   AST 15 - 41 U/L   25   ALT 0 - 44 U/L   14    Lipid Panel     Component Value Date/Time   CHOL 167 08/11/2022 0117   CHOL 173 03/02/2021 1523   TRIG 62 08/11/2022 0117   HDL 46 08/11/2022 0117   HDL 58 03/02/2021 1523   CHOLHDL 3.6 08/11/2022 0117   VLDL 12 08/11/2022 0117   LDLCALC 109 (H) 08/11/2022 0117   LDLCALC 96 03/02/2021 1523    CBC    Component Value  Date/Time   WBC 4.6 01/22/2023 1104   WBC 6.2 08/11/2022 0117   RBC 4.01 01/22/2023 1104   RBC 3.78 (L) 08/11/2022 0117   HGB 12.9 01/22/2023 1104   HCT 39.1 01/22/2023 1104   PLT 220 01/22/2023 1104   MCV 98 (H) 01/22/2023 1104   MCH 32.2 01/22/2023 1104   MCH 31.5 08/11/2022 0117   MCHC 33.0 01/22/2023 1104   MCHC 33.1 08/11/2022 0117   RDW 13.6 01/22/2023 1104   LYMPHSABS 2.3 01/22/2023 1104   EOSABS 0.1 01/22/2023 1104   BASOSABS 0.0 01/22/2023 1104    ASSESSMENT AND PLAN: 1. Essential hypertension At goal.  Continue current medications carvedilol  25 mg twice a day, amlodipine  10 mg daily, Diovan /hydrochlorothiazide  320/25 mg daily, and potassium supplement. - amLODipine  (NORVASC ) 10 MG tablet; Take 1 tablet (10 mg total) by mouth daily.  Dispense: 90 tablet; Refill: 1 - Comprehensive metabolic panel with GFR  2. PAD (peripheral artery disease) (HCC) Stable without rest pain or pain with ambulation.  Continue Pletal   3. Tobacco dependence Strongly advised to quit.  Not ready to give a trial  of quitting  4. CKD stage 3b, GFR 30-44 ml/min (HCC) Stable.  Will continue to monitor.  Good blood pressure control.  Avoid NSAIDs.  5. Other iron deficiency anemia Not taking prescribed iron.  Taking over-the-counter gummy iron to daily.  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 Herpes zoster vaccination declined   8. Pneumococcal vaccination declined   9. Postmenopausal estrogen deficiency (Primary) - DG Bone Density; Future  10.  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  Being followed by CT surgeon.  Will follow-up again with him in November of this year with repeat CAT scan.  Patient was given the opportunity to ask questions.  Patient verbalized understanding of the plan and was able to repeat key elements of the plan.    This documentation was completed using Paediatric nurse.  Any transcriptional errors are unintentional.  Orders Placed This Encounter  Procedures   DG Bone Density   CBC   Comprehensive metabolic panel with GFR   Iron, TIBC and Ferritin Panel     Requested Prescriptions   Signed Prescriptions Disp Refills   amLODipine  (NORVASC ) 10 MG tablet 90 tablet 1    Sig: Take 1 tablet (10 mg total) by mouth daily.    Return for sign a release to get dermatology record and biopsy from [facility].  Concetta Dee, MD, FACP

## 2023-05-24 ENCOUNTER — Ambulatory Visit: Payer: Medicaid Other | Admitting: Neurology

## 2023-05-24 LAB — COMPREHENSIVE METABOLIC PANEL WITH GFR
ALT: 12 IU/L (ref 0–32)
AST: 25 IU/L (ref 0–40)
Albumin: 4.5 g/dL (ref 3.9–4.9)
Alkaline Phosphatase: 72 IU/L (ref 44–121)
BUN/Creatinine Ratio: 16 (ref 12–28)
BUN: 21 mg/dL (ref 8–27)
Bilirubin Total: 0.2 mg/dL (ref 0.0–1.2)
CO2: 26 mmol/L (ref 20–29)
Calcium: 9.4 mg/dL (ref 8.7–10.3)
Chloride: 98 mmol/L (ref 96–106)
Creatinine, Ser: 1.3 mg/dL — ABNORMAL HIGH (ref 0.57–1.00)
Globulin, Total: 3.1 g/dL (ref 1.5–4.5)
Glucose: 93 mg/dL (ref 70–99)
Potassium: 4.2 mmol/L (ref 3.5–5.2)
Sodium: 139 mmol/L (ref 134–144)
Total Protein: 7.6 g/dL (ref 6.0–8.5)
eGFR: 46 mL/min/{1.73_m2} — ABNORMAL LOW (ref >59)

## 2023-05-24 LAB — CBC
Hematocrit: 41.1 % (ref 34.0–46.6)
Hemoglobin: 14.3 g/dL (ref 11.1–15.9)
MCH: 33.1 pg — ABNORMAL HIGH (ref 26.6–33.0)
MCHC: 34.8 g/dL (ref 31.5–35.7)
MCV: 95 fL (ref 79–97)
Platelets: 202 10*3/uL (ref 150–450)
RBC: 4.32 x10E6/uL (ref 3.77–5.28)
RDW: 12.8 % (ref 11.7–15.4)
WBC: 5.1 10*3/uL (ref 3.4–10.8)

## 2023-05-24 LAB — IRON,TIBC AND FERRITIN PANEL
Ferritin: 51 ng/mL (ref 15–150)
Iron Saturation: 17 % (ref 15–55)
Iron: 50 ug/dL (ref 27–139)
Total Iron Binding Capacity: 296 ug/dL (ref 250–450)
UIBC: 246 ug/dL (ref 118–369)

## 2023-05-25 ENCOUNTER — Encounter: Payer: Self-pay | Admitting: Internal Medicine

## 2023-06-10 ENCOUNTER — Telehealth: Payer: Self-pay | Admitting: Urology

## 2023-06-10 NOTE — Telephone Encounter (Signed)
-----   Message from [facility] sent at 06/10/2023 11:35 AM EDT ----- Please schedule f/u for this patient in the next 1-2 months.

## 2023-06-10 NOTE — Telephone Encounter (Signed)
 LVM for patient to call office back to schedule appt. Also sent mychart message.

## 2023-07-15 ENCOUNTER — Telehealth: Payer: Self-pay | Admitting: Internal Medicine

## 2023-07-15 ENCOUNTER — Other Ambulatory Visit: Payer: Self-pay | Admitting: Pharmacist

## 2023-07-15 MED ORDER — ATORVASTATIN CALCIUM 40 MG PO TABS: 40.0000 mg | ORAL_TABLET | Freq: Every day | ORAL | 2 refills | Status: DC

## 2023-07-15 NOTE — Telephone Encounter (Signed)
 Noted

## 2023-07-15 NOTE — Telephone Encounter (Signed)
 Copied from CRM [id number]. Topic: Clinical - Medication Question >> Jul 15, 2023 10:36 AM Donald Frost wrote:  Reason for CRM: [facility] the patients health coach from [place] called in asking to see if all future refills for her atorvastatin  (LIPITOR) 40 MG tablet can be in a #90 day supply as all of her other medications already are. She has an appt on June 23rd to follow up with hr primary for a Welcome to Medicare appt.

## 2023-07-15 NOTE — Telephone Encounter (Signed)
 Noted! Thank you

## 2023-07-17 ENCOUNTER — Ambulatory Visit (INDEPENDENT_AMBULATORY_CARE_PROVIDER_SITE_OTHER): Payer: Medicaid Other | Admitting: Vascular Surgery

## 2023-07-17 ENCOUNTER — Ambulatory Visit (HOSPITAL_COMMUNITY)
Admission: RE | Admit: 2023-07-17 | Discharge: 2023-07-17 | Disposition: A | Discharge status: Home / Self Care | Payer: Medicaid Other | Source: Ambulatory Visit | Attending: Vascular Surgery | Admitting: Vascular Surgery

## 2023-07-17 ENCOUNTER — Encounter: Payer: Self-pay | Admitting: Vascular Surgery

## 2023-07-17 VITALS — BP 119/76 | HR 68 | Temp 97.9°F | Ht 65.0 in | Wt 143.0 lb

## 2023-07-17 DIAGNOSIS — I739 Peripheral vascular disease, unspecified: Secondary | ICD-10-CM

## 2023-07-17 LAB — VAS US ABI WITH/WO TBI
Left ABI: 0.89
Right ABI: 0.88

## 2023-07-17 NOTE — Progress Notes (Signed)
 Patient ID: Brooke Chapman, female   DOB: Jan 16, 1961, 63 y.o.   MRN: [id number]  Reason for Consult: Follow-up   Referred by Lawrance Presume, MD  Subjective:     HPI:  Brooke Chapman is a 63 y.o. female has a history of bilateral common femoral endarterectomies and left common iliac artery stenting for rest pain and claudication.  She remains working at [employer] in [employer] and is off for the summer.  She has been active denies any recurrent claudication and is without tissue loss or ulceration.  She continues to smoke 10 cigarettes/day.  She is taking aspirin , statin and Pletal .  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 Luna Salinas, patient opted for surveillance imaging (12/12/21)   PAD (peripheral artery disease) (HCC)    Porokeratosis 08/26/2018   Pre-diabetes    PVD (peripheral vascular disease) (HCC) 08/26/2018   Skin lesion 05/26/2021   Tobacco dependence 05/01/2018   Unintended weight loss 01/13/2020   Family History  Problem Relation Age of Onset   Heart disease Mother    Hypertension Mother    Hyperlipidemia Mother    Hypertension Brother    Heart disease Maternal Aunt    Hypertension Maternal Aunt    Hyperlipidemia Maternal Aunt    Colon cancer Neg Hx    Liver cancer Neg Hx    Rectal cancer Neg Hx    Stomach cancer Neg Hx    Past Surgical History:  Procedure Laterality Date   ABDOMINAL AORTOGRAM W/LOWER EXTREMITY Bilateral 07/13/2019   Procedure:  ABDOMINAL AORTOGRAM W/LOWER EXTREMITY;  Surgeon: Adine Hoof, MD;  Location: [facility] INVASIVE CV LAB;  Service: Cardiovascular;  Laterality: Bilateral;   ABDOMINAL AORTOGRAM W/LOWER EXTREMITY N/A 01/08/2022   Procedure: ABDOMINAL AORTOGRAM W/LOWER EXTREMITY;  Surgeon: Kayla Part, MD;  Location: [facility] INVASIVE CV LAB;  Service: Vascular;  Laterality: N/A;   BREAST EXCISIONAL BIOPSY Left    LESS THAN 5 YEARS AGO   COLONOSCOPY     ENDARTERECTOMY FEMORAL Right 02/20/2022   Procedure: RIGHT COMMON FEMORAL ENDARTERECTOMY WITH VEIN PATCH;  Surgeon: Adine Hoof, MD;  Location: [facility] OR;  Service: Vascular;  Laterality: Right;   ENDARTERECTOMY FEMORAL Left 08/10/2022   Procedure: LEFT COMMON FEMORAL ENDARTERECTOMY;  Surgeon: Adine Hoof, MD;  Location: [facility] OR;  Service: Vascular;  Laterality: Left;   PERIPHERAL VASCULAR INTERVENTION Left 07/13/2019   Procedure: PERIPHERAL VASCULAR INTERVENTION;  Surgeon: Adine Hoof, MD;  Location: [facility] INVASIVE CV LAB;  Service: Cardiovascular;  Laterality: Left;  common iliac    Short Social History:  Social History   Tobacco Use   Smoking status: Every Day    Current packs/day: 1.00    Average packs/day: 1 pack/day for 50.5 years (50.5 ttl pk-yrs)    Types: Cigarettes    Start date: 72   Smokeless tobacco: Never   Tobacco comments:    5 cigs daily  Substance Use Topics   Alcohol  use: Yes    Alcohol/week: 2.0 standard drinks of alcohol    Types: 2 Standard drinks or equivalent per week    Comment: occasional    Allergies  Allergen Reactions   Latex Rash    Current Outpatient Medications  Medication Sig Dispense Refill   amLODipine  (NORVASC ) 10 MG tablet Take 1 tablet (10 mg total) by mouth daily. 90 tablet 1   aspirin  81 MG chewable tablet Chew 1 tablet (81 mg total) by mouth daily. 100 tablet 1   atorvastatin  (LIPITOR) 40 MG tablet Take 1 tablet (40 mg total) by mouth daily. 90 tablet 2   Blood  Pressure Monitor DEVI Use as directed to check home blood pressure 2-3 times a week 1 Device 0   carvedilol  (COREG ) 25 MG tablet Take 1 tablet by mouth twice daily 180 tablet 1   cilostazol  (PLETAL ) 100 MG tablet Take 1 tablet (100 mg total) by mouth 2 (two) times daily. 180 tablet 3   ferrous sulfate  325 (65 FE) MG tablet Take 1 tablet (325 mg total) by mouth 2 (two) times daily with a meal. (Patient taking differently: Take 325 mg by mouth once a week.) 100 tablet 1   Multiple Vitamins-Minerals (HAIR SKIN AND NAILS FORMULA) TABS Take 2 tablets by mouth daily.     nitroGLYCERIN  (NITROSTAT ) 0.4 MG SL tablet Place 1 tablet (0.4 mg total) under the tongue every 5 (five) minutes as needed. 25 tablet 6   potassium chloride  (KLOR-CON ) 8 MEQ tablet Take 2 tablets (16 mEq total) by mouth daily. 180 tablet 1   topiramate  (TOPAMAX ) 25 MG tablet Take 1 tablet (25 mg total) by mouth at bedtime. (Patient not taking: Reported on 05/23/2023) 30 tablet 2   valsartan -hydrochlorothiazide  (DIOVAN -HCT) 320-25 MG tablet Take 1 tablet by mouth daily. 90 tablet 1   Vitamin D , Ergocalciferol , (DRISDOL ) 1.25 MG (50000 UNIT) CAPS capsule Take 1 capsule (50,000 Units total) by mouth every 7 (seven) days. 16 capsule 0   Current Facility-Administered Medications  Medication Dose Route Frequency Provider Last Rate Last Admin   cloNIDine  (CATAPRES ) tablet 0.1 mg  0.1 mg Oral Once Hassie Lint, PA-C        Review of Systems  Constitutional:  Constitutional negative. HENT: HENT negative.  Eyes: Eyes negative.  Respiratory: Respiratory negative.  Cardiovascular: Cardiovascular negative.  GI: Gastrointestinal negative.  Musculoskeletal: Musculoskeletal negative.  Skin: Skin negative.  Neurological: Neurological negative. Hematologic: Hematologic/lymphatic negative.  Psychiatric: Psychiatric negative.        Objective:  Objective   Vitals:   07/17/23 1515  BP: 119/76  Pulse: 68  Temp: 97.9 F (36.6 C)   SpO2: 95%  Weight: 143 lb (64.9 kg)  Height: 5' 5 (1.651 m)   Body mass index is 23.8 kg/m.  Physical Exam HENT:     Head: Normocephalic.     Nose: Nose normal.   Eyes:     Pupils: Pupils are equal, round, and reactive to light.    Cardiovascular:     Pulses:          Femoral pulses are 2+ on the right side and 2+ on the left side.      Popliteal pulses are 0 on the right side and 0 on the left side.  Pulmonary:     Effort: Pulmonary effort is normal.  Abdominal:     General: Abdomen is flat.   Skin:    General: Skin is warm.     Capillary Refill: Capillary refill takes less  than 2 seconds.   Neurological:     General: No focal deficit present.     Mental Status: She is alert.   Psychiatric:        Mood and Affect: Mood normal.        Thought Content: Thought content normal.        Judgment: Judgment normal.     Data: ABI Findings:  +---------+------------------+-----+-------------------+--------+  Right   Rt Pressure (mmHg)IndexWaveform           Comment   +---------+------------------+-----+-------------------+--------+  Brachial 119                                                 +---------+------------------+-----+-------------------+--------+  ATA     79                0.66 dampened monophasic          +---------+------------------+-----+-------------------+--------+  PTA     105               0.88 dampened monophasic          +---------+------------------+-----+-------------------+--------+  Great Toe35                0.29                              +---------+------------------+-----+-------------------+--------+   +---------+------------------+-----+-------------------+-------+  Left    Lt Pressure (mmHg)IndexWaveform           Comment  +---------+------------------+-----+-------------------+-------+  Brachial 119                                                 +---------+------------------+-----+-------------------+-------+  ATA     75                0.63 dampened monophasic         +---------+------------------+-----+-------------------+-------+  PTA     106               0.89 dampened monophasic         +---------+------------------+-----+-------------------+-------+  Great Toe44                0.37                             +---------+------------------+-----+-------------------+-------+   +-------+-----------+-----------+------------+------------+  ABI/TBIToday's ABIToday's TBIPrevious ABIPrevious TBI  +-------+-----------+-----------+------------+------------+  Right 0.88       0.29       0.70        0.16          +-------+-----------+-----------+------------+------------+  Left  0.89       0.37       0.56        0.14          +-------+-----------+-----------+------------+------------+      Bilateral ABIs and TBIs appear increased compared to prior study on  01/16/2023, however this may be secondary to medial arterial  calcification.    Summary:  Right: Resting right ankle-brachial index indicates mild right lower  extremity arterial disease, however given severity of disease this may be  falsely elevated.   Left: Resting right ankle-brachial index indicates mild  right lower  extremity arterial disease, however given severity of disease this may be  falsely elevated.   RIGHT      PSV    RatioStenosisWaveform  Comments                                      cm/s                                                             +-----------+-------+-----+--------+----------+----------------------------  ----+  CFA Distal 48                  monophasic                                   +-----------+-------+-----+--------+----------+----------------------------  ----+  DFA       37                  monophasic                                    +-----------+-------+-----+--------+----------+----------------------------  ----+  SFA Prox   100                                                              +-----------+-------+-----+--------+----------+----------------------------  ----+  SFA Mid                occluded          occludes shortly after the                                                  takeoff                            +-----------+-------+-----+--------+----------+----------------------------  ----+  SFA Distal             occluded                                             +-----------+-------+-----+--------+----------+----------------------------  ----+  POP Prox               occluded                                             +-----------+-------+-----+--------+----------+----------------------------  ----+  POP Distal 11                  monophasicreconstitutes via  collateral      +-----------+-------+-----+--------+----------+----------------------------  ----+  TP Trunk   11                  monophasic                                   +-----------+-------+-----+--------+----------+----------------------------  ----+  ATA Prox   20                  monophasic                                   +-----------+-------+-----+--------+----------+----------------------------  ----+  ATA Mid    19                  monophasic                                   +-----------+-------+-----+--------+----------+----------------------------  ----+  ATA Distal 23                  monophasic                                   +-----------+-------+-----+--------+----------+----------------------------  ----+  PTA Prox   17                  monophasic                                   +-----------+-------+-----+--------+----------+----------------------------  ----+  PTA Mid    18                  monophasic                                    +-----------+-------+-----+--------+----------+----------------------------  ----+  PTA Distal 13                  monophasic                                   +-----------+-------+-----+--------+----------+----------------------------  ----+  PERO Mid   17                  monophasic                                   +-----------+-------+-----+--------+----------+----------------------------  ----+  PERO Distal12                  monophasic                                   +-----------+-------+-----+--------+----------+----------------------------  ----+     +----------+--------+-----+--------------+----------+----------------------  ----+  LEFT     PSV cm/sRatioStenosis      Waveform  Comments                     +----------+--------+-----+--------------+----------+----------------------  ----+  EIA [id number]  50-74%        monophasic                                                    stenosis                                             +----------+--------+-----+--------------+----------+----------------------  ----+  CFA Prox  69                         monophasic1.65 x 1.61 cm               +----------+--------+-----+--------------+----------+----------------------  ----+  CFA Distal35                         monophasic                             +----------+--------+-----+--------------+----------+----------------------  ----+  DFA      49                         monophasic                             +----------+--------+-----+--------------+----------+----------------------  ----+  SFA Prox  349                        monophasicoccludes just after                                                         takeoff                      +----------+--------+-----+--------------+----------+----------------------  ----+  SFA Mid                occluded                                              +----------+--------+-----+--------------+----------+----------------------  ----+  SFA Distal             occluded                                             +----------+--------+-----+--------------+----------+----------------------  ----+  POP Prox               occluded                                             +----------+--------+-----+--------------+----------+----------------------  ----+  POP Distal  occluded                                             +----------+--------+-----+--------------+----------+----------------------  ----+  TP Trunk  18                         monophasicreconstitutes via                                                           collateral                   +----------+--------+-----+--------------+----------+----------------------  ----+  ATA Prox  17                         monophasic                             +----------+--------+-----+--------------+----------+----------------------  ----+  ATA Mid   17                         monophasic                             +----------+--------+-----+--------------+----------+----------------------  ----+  ATA Distal16                         monophasic                             +----------+--------+-----+--------------+----------+----------------------  ----+  PTA Prox  20                         monophasic                             +----------+--------+-----+--------------+----------+----------------------  ----+  PTA Mid   18                         monophasic                             +----------+--------+-----+--------------+----------+----------------------  ----+  PTA Distal22                         monophasic                             +----------+--------+-----+--------------+----------+----------------------  ----+  PERO Prox 9                           monophasic                             +----------+--------+-----+--------------+----------+----------------------  ----+  PERO Mid  occluded                                             +----------+--------+-----+--------------+----------+----------------------  ----+  PERO                  occluded                                             Distal                                                                      +----------+--------+-----+--------------+----------+----------------------  ----+     Summary:  Right: Total occlusion noted in the superficial femoral artery.   Left: 50-74% stenosis noted in the iliac segment. Total occlusion noted in  the superficial femoral artery. Total occlusion noted in the peroneal  artery.          Assessment/Plan:    63 year old female history of bilateral common femoral endarterectomies with readily palpable common femoral pulses and stable ABIs with known occluded SFAs.  She remains asymptomatic at this time and remains on appropriate medical therapy.  As such I will have her follow-up in 1 year with repeat noninvasive studies unless she has issues before then we will certainly be happy to see her prior.  All questions were answered she demonstrates good understanding.     Adine Hoof MD [facility]

## 2023-07-22 ENCOUNTER — Ambulatory Visit: Attending: Internal Medicine | Admitting: Internal Medicine

## 2023-07-22 ENCOUNTER — Encounter: Payer: Self-pay | Admitting: Internal Medicine

## 2023-07-22 VITALS — BP 134/84 | HR 72 | Temp 98.3°F | Ht 65.0 in | Wt 144.0 lb

## 2023-07-22 DIAGNOSIS — I10 Essential (primary) hypertension: Secondary | ICD-10-CM | POA: Diagnosis not present

## 2023-07-22 DIAGNOSIS — Z7189 Other specified counseling: Secondary | ICD-10-CM

## 2023-07-22 DIAGNOSIS — Z28310 Unvaccinated for covid-19: Secondary | ICD-10-CM | POA: Diagnosis not present

## 2023-07-22 DIAGNOSIS — Z2821 Immunization not carried out because of patient refusal: Secondary | ICD-10-CM

## 2023-07-22 DIAGNOSIS — Z0000 Encounter for general adult medical examination without abnormal findings: Secondary | ICD-10-CM | POA: Diagnosis not present

## 2023-07-22 DIAGNOSIS — Z780 Asymptomatic menopausal state: Secondary | ICD-10-CM

## 2023-07-22 NOTE — Progress Notes (Signed)
 Subjective:    Brooke Chapman is a 63 y.o. female who presents for a Welcome to Medicare exam.  Patient with history of PAD (s/P RT CFA endarterectomy), aortic atherosclerosis, emphysema changes seen on CT 05/2020, HTN, tob dep, HL, urge incont, IDA, RT LLL nodule and posterior mediastinal mass on CT 05/2020 (followed by CT surgeon Dr. Kerrin, questionably benign schwannoma.  Patient not wanting biopsy.), left renal mass (Saw urologist Dr. Roseann 01/26/2022.  Given stability in size, he thinks it is likely benign), CKD 3b .  Cardiac Risk Factors include: advanced age (>65men, >73 women);hypertension      Objective:    Today's Vitals   07/22/23 1019 07/22/23 1351  BP: (!) 157/84 134/84  Pulse: 72   Temp: 98.3 F (36.8 C)   TempSrc: Oral   SpO2: 100%   Weight: 144 lb (65.3 kg)   Height: 5' 5 (1.651 m)   Body mass index is 23.96 kg/m. Gen: Older African-American female in NAD Chest: Clear to auscultation bilaterally CVS: Regular rate rhythm, no gallops Extremities: No lower extremity edema.  Good peripheral pulses. Medications Outpatient Encounter Medications as of 07/22/2023  Medication Sig   amLODipine  (NORVASC ) 10 MG tablet Take 1 tablet (10 mg total) by mouth daily.   aspirin  81 MG chewable tablet Chew 1 tablet (81 mg total) by mouth daily.   atorvastatin  (LIPITOR) 40 MG tablet Take 1 tablet (40 mg total) by mouth daily.   Blood Pressure Monitor DEVI Use as directed to check home blood pressure 2-3 times a week   carvedilol  (COREG ) 25 MG tablet Take 1 tablet by mouth twice daily   cilostazol  (PLETAL ) 100 MG tablet Take 1 tablet (100 mg total) by mouth 2 (two) times daily.   ferrous sulfate  325 (65 FE) MG tablet Take 1 tablet (325 mg total) by mouth 2 (two) times daily with a meal. (Patient taking differently: Take 325 mg by mouth once a week.)   Multiple Vitamins-Minerals (HAIR SKIN AND NAILS FORMULA) TABS Take 2 tablets by mouth daily.   nitroGLYCERIN  (NITROSTAT ) 0.4  MG SL tablet Place 1 tablet (0.4 mg total) under the tongue every 5 (five) minutes as needed.   potassium chloride  (KLOR-CON ) 8 MEQ tablet Take 2 tablets (16 mEq total) by mouth daily.   topiramate  (TOPAMAX ) 25 MG tablet Take 1 tablet (25 mg total) by mouth at bedtime.   valsartan -hydrochlorothiazide  (DIOVAN -HCT) 320-25 MG tablet Take 1 tablet by mouth daily.   Vitamin D , Ergocalciferol , (DRISDOL ) 1.25 MG (50000 UNIT) CAPS capsule Take 1 capsule (50,000 Units total) by mouth every 7 (seven) days.   [DISCONTINUED] cloNIDine  (CATAPRES ) tablet 0.1 mg    No facility-administered encounter medications on file as of 07/22/2023.     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 Kerrin, patient opted for surveillance imaging (12/12/21)   PAD (peripheral artery disease) (HCC)    Porokeratosis 08/26/2018   Pre-diabetes    PVD (peripheral vascular disease) (HCC)  08/26/2018   Skin lesion 05/26/2021   Tobacco dependence 05/01/2018   Unintended weight loss 01/13/2020   Past Surgical History:  Procedure Laterality Date   ABDOMINAL AORTOGRAM W/LOWER EXTREMITY Bilateral 07/13/2019   Procedure: ABDOMINAL AORTOGRAM W/LOWER EXTREMITY;  Surgeon: Sheree Penne Bruckner, MD;  Location: [facility] INVASIVE CV LAB;  Service: Cardiovascular;  Laterality: Bilateral;   ABDOMINAL AORTOGRAM W/LOWER EXTREMITY N/A 01/08/2022   Procedure: ABDOMINAL AORTOGRAM W/LOWER EXTREMITY;  Surgeon: Lanis Fonda BRAVO, MD;  Location: [facility]  INVASIVE CV LAB;  Service: Vascular;  Laterality: N/A;   BREAST EXCISIONAL BIOPSY Left    LESS THAN 5 YEARS AGO   COLONOSCOPY     ENDARTERECTOMY FEMORAL Right 02/20/2022   Procedure: RIGHT COMMON FEMORAL ENDARTERECTOMY WITH VEIN PATCH;  Surgeon: Sheree Penne Bruckner, MD;  Location: [facility] OR;  Service: Vascular;  Laterality: Right;   ENDARTERECTOMY FEMORAL Left 08/10/2022   Procedure: LEFT COMMON FEMORAL ENDARTERECTOMY;  Surgeon: Sheree Penne Bruckner, MD;  Location: [facility] OR;  Service: Vascular;  Laterality: Left;   PERIPHERAL VASCULAR INTERVENTION Left 07/13/2019   Procedure: PERIPHERAL VASCULAR INTERVENTION;  Surgeon: Sheree Penne Bruckner, MD;  Location: [facility] INVASIVE CV LAB;  Service: Cardiovascular;  Laterality: Left;  common iliac    Family History  Problem Relation Age of Onset   Heart disease Mother    Hypertension Mother    Hyperlipidemia Mother    Hypertension Brother    Heart disease Maternal Aunt    Hypertension Maternal Aunt    Hyperlipidemia Maternal Aunt    Colon cancer Neg Hx    Liver cancer Neg Hx    Rectal cancer Neg Hx    Stomach cancer Neg Hx    Social History   Occupational History   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  Not ready to quit as yet but set goal to quit in 1 yr. Had screen CT 11/2022.   Immunizations and Health Maintenance Immunization History  Administered Date(s)  Administered   Tdap 07/27/2020  Declines COVID vaccine series  Health Maintenance Due  Topic Date Due   COVID-19 Vaccine (1 - 2024-25 season) Never done   Cervical Cancer Screening (Pap smear)  10/07/2022   DEXA SCAN  Never done    Activities of Daily Living    07/22/2023   10:22 AM 07/19/2023   12:36 PM  In your present state of health, do you have any difficulty performing the following activities:  Hearing? 0 0  Vision? 0 0  Difficulty concentrating or making decisions? 0 0  Walking or climbing stairs? 0 0  Dressing or bathing? 0 0  Doing errands, shopping? 0 0  Preparing Food and eating ? N N  Using the Toilet? N N  In the past six months, have you accidently leaked urine? N N  Do  you have problems with loss of bowel control? N N  Managing your Medications? N N  Managing your Finances? N N  Housekeeping or managing your Housekeeping? N N    Physical Exam   Physical Exam (optional), or other factors deemed appropriate based on the beneficiary's medical and social history and current clinical standards.   Advanced Directives: Does Patient Have a Medical Advance Directive?: No Does patient want to make changes to medical advance directive?: Yes (MAU/Ambulatory/Procedural Areas - Information given) Would patient like information on creating a medical advance directive?: Yes (MAU/Ambulatory/Procedural Areas - Information given)  EKG:  Pt declines      Assessment:    This is a routine wellness examination for this patient .   Vision/Hearing screen Vision Screening   Right eye Left eye Both eyes  Without correction     With correction 220/20 20/15 20/20   Whisper test: Normal on both sides   Goals      Quit Smoking        Depression Screen    07/22/2023   10:21 AM 05/23/2023    2:25 PM 06/14/2022    9:52 AM 02/08/2022   11:16 AM  PHQ 2/9 Scores  PHQ - 2 Score 0 0 0 0  PHQ- 9 Score 0 0 0 0     Fall Risk    07/22/2023   10:20 AM  Fall Risk   Falls  in the past year? 0  Number falls in past yr: 0  Injury with Fall? 0  Risk for fall due to : No Fall Risks  Follow up Falls evaluation completed    Cognitive Function:    07/22/2023   10:39 AM 07/22/2023   10:28 AM  MMSE - Mini Mental State Exam  Orientation to time 5 5  Orientation to Place 4 4  Registration 3 3  Attention/ Calculation 5 5  Recall 1 1  Language- name 2 objects 2 2  Language- repeat 1 1  Language- follow 3 step command 3 3  Language- read & follow direction 1 0  Write a sentence 1 1  Copy design 1 1  Total score 27 26        Patient Care Team: Vicci Barnie NOVAK, MD as PCP - General (Internal Medicine) Sheree Penne Bruckner, MD as Consulting Physician (Vascular Surgery)     Plan:    1. Encounter for Medicare annual wellness exam (Primary)   2. Postmenopausal estrogen deficiency - DG Bone Density; Future  3. Advance directive discussed with patient Discussed advanced directive including living will and healthcare power of attorney.  Patient given information packet to take home to review.  Advised that if she executes a living will or healthcare power of attorney, she should bring a copy for our records.  4. Essential hypertension Blood pressure elevated today but improved on repeat though still above goal.  She reports compliance with amlodipine  10 mg daily, carvedilol  25 mg twice a day and Diovan /HCTZ 320/25 mg daily.  She is not interested in having any medicines added at this time.  Encouraged her to continue current medications and to check blood pressure at least once a week.  5. COVID-19 vaccine series declined She has never had the COVID-19 vaccine series and declines getting it.  She is due for Pap smear.  We will have her follow-up in 1 month for this.  I have personally reviewed and noted the following in the patient's chart:   Medical and social history Use of  alcohol, tobacco or illicit drugs  Current medications and supplements  including opioid prescriptions. Patient is not currently taking opioid prescriptions. Functional ability and status Nutritional status Physical activity Advanced directives List of other physicians Hospitalizations, surgeries, and ER visits in previous 12 months Vitals Screenings to include cognitive, depression, and falls Referrals and appointments  In addition, I have reviewed and discussed with patient certain preventive protocols, quality metrics, and best practice recommendations. A written personalized care plan for preventive services as well as general preventive health recommendations were provided to patient.     Barnie Louder, MD 07/22/2023

## 2023-07-22 NOTE — Patient Instructions (Signed)
 If you execute a living will or healthcare power of attorney, please bring a copy for our records.  We have ordered your bone density study.  They will call you to schedule.  Continue to monitor your blood pressure with goal being 130/80 or lower.

## 2023-08-07 ENCOUNTER — Encounter: Payer: Self-pay | Admitting: Urology

## 2023-08-07 ENCOUNTER — Ambulatory Visit (INDEPENDENT_AMBULATORY_CARE_PROVIDER_SITE_OTHER): Admitting: Urology

## 2023-08-07 VITALS — BP 155/94 | HR 71 | Ht 64.0 in | Wt 144.0 lb

## 2023-08-07 DIAGNOSIS — N261 Atrophy of kidney (terminal): Secondary | ICD-10-CM

## 2023-08-07 DIAGNOSIS — N2889 Other specified disorders of kidney and ureter: Secondary | ICD-10-CM

## 2023-08-07 LAB — URINALYSIS, ROUTINE W REFLEX MICROSCOPIC
Bilirubin, UA: NEGATIVE
Glucose, UA: NEGATIVE
Leukocytes,UA: NEGATIVE
Nitrite, UA: NEGATIVE
RBC, UA: NEGATIVE
Specific Gravity, UA: 1.025 (ref 1.005–1.030)
Urobilinogen, Ur: 0.2 mg/dL (ref 0.2–1.0)
pH, UA: 6 (ref 5.0–7.5)

## 2023-08-07 LAB — MICROSCOPIC EXAMINATION

## 2023-08-07 NOTE — Progress Notes (Signed)
 Assessment: 1. Renal mass; left - non-enhancing, stable in size x 18 months   2. Atrophic kidney, right     Plan: Recommend continued surveillance of the left renal mass with follow-up MRI abdomen w/ and w/o contrast Will contact her with results  Return to office in 1 year  Chief Complaint:  Chief Complaint  Patient presents with   Renal mass    History of Present Illness:  Brooke Chapman is a 63 y.o. female who is seen for further evaluation of a left renal mass. She was evaluated with a CT chest without contrast on 09/30/2021.  This showed a exophytic isodense 1.7 cm upper left renal cortical lesion which was stable in comparison to a study from 5/22, a simple 1.4 cm posterior left renal cyst, and asymmetric right renal atrophy.  She was evaluated further with a MRI of the abdomen with and without contrast on 11/04/2021.  This showed a round non-enhancing 2.0 x 1.3 cm mass closely opposed to the spleen, superior pole of left kidney and left adrenal gland, marked atrophy of the right kidney, simple benign bilateral renal cortical cyst.  At her visit in May 2024, she was not having any flank or abdominal pain.  No dysuria or gross hematuria.  She returns today for follow-up.  She has not had any flank or abdominal pain.  No weight loss.  No new lower urinary tract symptoms.  No dysuria or gross hematuria.  She has not had any imaging since her last visit.  Portions of the above documentation were copied from a prior visit for review purposes only.   Past Medical History:  Past Medical History:  Diagnosis Date   Anemia    Antiplatelet or antithrombotic long-term use 01/13/2020   Aortic stenosis 07/05/2021   mild AS by echo   Black stools 01/13/2020   CKD (chronic kidney disease)    CKD 3b with atrophic right kidney and left renal lesion followed by urology (02/19/22)   Frontal balding 12/28/2019   GAD (generalized anxiety disorder) 05/01/2020   Headache    HTN (hypertension)  05/01/2018   Hyperlipidemia    Iron deficiency anemia 12/28/2019   Mediastinal mass 09/29/2021   followed by CT surgeon Dr. Kerrin, patient opted for surveillance imaging (12/12/21)   PAD (peripheral artery disease) (HCC)    Porokeratosis 08/26/2018   Pre-diabetes    PVD (peripheral vascular disease) (HCC) 08/26/2018   Skin lesion 05/26/2021   Tobacco dependence 05/01/2018   Unintended weight loss 01/13/2020    Past Surgical History:  Past Surgical History:  Procedure Laterality Date   ABDOMINAL AORTOGRAM W/LOWER EXTREMITY Bilateral 07/13/2019   Procedure: ABDOMINAL AORTOGRAM W/LOWER EXTREMITY;  Surgeon: Sheree Penne Bruckner, MD;  Location: [facility] INVASIVE CV LAB;  Service: Cardiovascular;  Laterality: Bilateral;   ABDOMINAL AORTOGRAM W/LOWER EXTREMITY N/A 01/08/2022   Procedure: ABDOMINAL AORTOGRAM W/LOWER EXTREMITY;  Surgeon: Lanis Fonda BRAVO, MD;  Location: [facility] INVASIVE CV LAB;  Service: Vascular;  Laterality: N/A;   BREAST EXCISIONAL BIOPSY Left    LESS THAN 5 YEARS AGO   COLONOSCOPY     ENDARTERECTOMY FEMORAL Right 02/20/2022   Procedure: RIGHT COMMON FEMORAL ENDARTERECTOMY WITH VEIN PATCH;  Surgeon: Sheree Penne Bruckner, MD;  Location: [facility] OR;  Service: Vascular;  Laterality: Right;   ENDARTERECTOMY FEMORAL Left 08/10/2022   Procedure: LEFT COMMON FEMORAL ENDARTERECTOMY;  Surgeon: Sheree Penne Bruckner, MD;  Location: [facility] OR;  Service: Vascular;  Laterality: Left;   PERIPHERAL VASCULAR INTERVENTION Left 07/13/2019  Procedure: PERIPHERAL VASCULAR INTERVENTION;  Surgeon: Sheree Penne Bruckner, MD;  Location: [facility] INVASIVE CV LAB;  Service: Cardiovascular;  Laterality: Left;  common iliac    Allergies:  Allergies  Allergen Reactions   Latex Rash    Family History:  Family History  Problem Relation Age of Onset   Heart disease Mother    Hypertension Mother    Hyperlipidemia Mother    Hypertension Brother    Heart disease Maternal Aunt    Hypertension  Maternal Aunt    Hyperlipidemia Maternal Aunt    Colon cancer Neg Hx    Liver cancer Neg Hx    Rectal cancer Neg Hx    Stomach cancer Neg Hx     Social History:  Social History   Tobacco Use   Smoking status: Every Day    Current packs/day: 1.00    Average packs/day: 1 pack/day for 50.5 years (50.5 ttl pk-yrs)    Types: Cigarettes    Start date: 1975   Smokeless tobacco: Never   Tobacco comments:    5 cigs daily  Vaping Use   Vaping status: Never Used  Substance Use Topics   Alcohol use: Yes    Alcohol/week: 2.0 standard drinks of alcohol    Types: 2 Standard drinks or equivalent per week    Comment: occasional   Drug use: Never    ROS: Constitutional:  Negative for fever, chills, weight loss CV: Negative for chest pain, previous MI, hypertension Respiratory:  Negative for shortness of breath, wheezing, sleep apnea, frequent cough GI:  Negative for nausea, vomiting, bloody stool, GERD  Physical exam: BP (!) 155/94   Pulse 71   Ht 5' 4 (1.626 m)   Wt 144 lb (65.3 kg)   BMI 24.72 kg/m  GENERAL APPEARANCE:  Well appearing, well developed, well nourished, NAD HEENT:  Atraumatic, normocephalic, oropharynx clear NECK:  Supple without lymphadenopathy or thyromegaly CHEST:  Lungs CTA bilaterally CV:  RRR ABDOMEN:  Soft, non-tender, no masses EXTREMITIES:  Moves all extremities well, without clubbing, cyanosis, or edema NEUROLOGIC:  Alert and oriented x 3, normal gait, CN II-XII grossly intact MENTAL STATUS:  appropriate BACK:  Non-tender to palpation, No CVAT SKIN:  Warm, dry, and intact  Results: U/A: 0-5 WBC, 0-2 RBC

## 2023-08-19 ENCOUNTER — Ambulatory Visit (HOSPITAL_BASED_OUTPATIENT_CLINIC_OR_DEPARTMENT_OTHER)
Admission: RE | Admit: 2023-08-19 | Discharge: 2023-08-19 | Disposition: A | Discharge status: Home / Self Care | Source: Ambulatory Visit | Attending: Internal Medicine | Admitting: Internal Medicine

## 2023-08-19 DIAGNOSIS — Z780 Asymptomatic menopausal state: Secondary | ICD-10-CM | POA: Diagnosis not present

## 2023-08-19 DIAGNOSIS — M85852 Other specified disorders of bone density and structure, left thigh: Secondary | ICD-10-CM | POA: Diagnosis not present

## 2023-08-19 DIAGNOSIS — M85851 Other specified disorders of bone density and structure, right thigh: Secondary | ICD-10-CM | POA: Diagnosis not present

## 2023-08-21 ENCOUNTER — Ambulatory Visit: Payer: Self-pay | Admitting: Internal Medicine

## 2023-08-29 ENCOUNTER — Telehealth: Payer: Self-pay | Admitting: Internal Medicine

## 2023-08-29 NOTE — Telephone Encounter (Signed)
 Called to confirm appt for 8/1 LVM

## 2023-08-30 ENCOUNTER — Other Ambulatory Visit (HOSPITAL_COMMUNITY)
Admission: RE | Admit: 2023-08-30 | Discharge: 2023-08-30 | Disposition: A | Discharge status: Home / Self Care | Source: Ambulatory Visit | Attending: Internal Medicine | Admitting: Internal Medicine

## 2023-08-30 ENCOUNTER — Ambulatory Visit: Admitting: Pharmacist

## 2023-08-30 ENCOUNTER — Encounter: Payer: Self-pay | Admitting: Internal Medicine

## 2023-08-30 ENCOUNTER — Ambulatory Visit: Attending: Internal Medicine | Admitting: Internal Medicine

## 2023-08-30 VITALS — BP 160/96 | HR 68 | Temp 98.3°F | Ht 64.0 in | Wt 144.0 lb

## 2023-08-30 DIAGNOSIS — I10 Essential (primary) hypertension: Secondary | ICD-10-CM | POA: Diagnosis not present

## 2023-08-30 DIAGNOSIS — Z01419 Encounter for gynecological examination (general) (routine) without abnormal findings: Secondary | ICD-10-CM | POA: Insufficient documentation

## 2023-08-30 DIAGNOSIS — Z124 Encounter for screening for malignant neoplasm of cervix: Secondary | ICD-10-CM

## 2023-08-30 DIAGNOSIS — Z1151 Encounter for screening for human papillomavirus (HPV): Secondary | ICD-10-CM | POA: Diagnosis not present

## 2023-08-30 MED ORDER — HYDRALAZINE HCL 10 MG PO TABS: 10.0000 mg | ORAL_TABLET | Freq: Two times a day (BID) | ORAL | 1 refills | Status: DC

## 2023-08-30 NOTE — Progress Notes (Signed)
 Patient ID: Brooke Chapman, female    DOB: 06-26-1960  MRN: [id number]  CC: Gynecologic Exam (Pap./Check finger on L hand /)   Subjective: Brooke Chapman is a 63 y.o. 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 Kerrin, questionably benign schwannoma.  Patient not wanting biopsy.), left renal mass (Saw urologist Dr. Roseann 01/26/2022.  Given stability in size, he thinks it is likely benign), CKD 3b   GYN History:  Pt is G0P0 Any hx of abn paps?: 2021, HPV + with negative subset Menses regular or irregular?: NA How long does menses last? NA Menstrual flow light or heavy?: NA Method of birth control?:  NA Any vaginal dischg at this time?: NO Dysuria?: no Any hx of STI?: no Sexually active with how many partners:  Desires STI screen: no Last MMG: 10/2022 Family hx of uterine, cervical or breast cancer?:  no  HTN: took meds already this a.m.  Should be on amlodipine  10 mg daily, carvedilol  25 mg twice a day and Diovan /HCTZ 320/25 mg daily.  BP this a.m at home was 135/88 -limits salt in foods     Patient Active Problem List   Diagnosis Date Noted   Limb ischemia 08/10/2022   Migraine with aura and without status migrainosus, not intractable 06/14/2022   Lupus 02/08/2022   Kidney lesion, native, left 02/08/2022   Paraspinal mass 02/08/2022   Renal mass; left - non-enhancing, stable in size x 18 months 01/25/2022   Atrophic kidney, right 01/25/2022   CKD (chronic kidney disease) stage 3, GFR 30-59 ml/min (HCC) 11/11/2021   Coronary artery disease 06/15/2021   Chest pain of uncertain etiology 06/15/2021   Cardiac murmur 06/15/2021   Anemia 06/13/2021   Skin lesion 05/26/2021   GAD (generalized anxiety disorder) 05/01/2020    Black stools 01/13/2020   Unintended weight loss 01/13/2020   Antiplatelet or antithrombotic long-term use 01/13/2020   Hypertension    Frontal balding 12/28/2019   Iron deficiency anemia 12/28/2019   Essential hypertension    Porokeratosis 08/26/2018   PVD (peripheral vascular disease) (HCC) 08/26/2018   Hyperlipidemia 05/02/2018   HTN (hypertension) 05/01/2018   Tobacco dependence 05/01/2018   PAD (peripheral artery disease) (HCC)      Current Outpatient Medications on File Prior to Visit  Medication Sig Dispense Refill   amLODipine  (NORVASC ) 10 MG tablet Take 1 tablet (10 mg total) by mouth daily. 90 tablet 1   aspirin  81 MG chewable tablet Chew 1 tablet (81 mg total) by mouth daily. 100 tablet 1   atorvastatin  (LIPITOR) 40 MG tablet Take 1 tablet (40 mg total) by mouth daily. 90 tablet 2   Blood Pressure Monitor DEVI Use as directed to check home blood pressure 2-3 times a week 1 Device 0   carvedilol  (COREG ) 25 MG tablet Take 1 tablet by mouth twice daily 180 tablet 1   cilostazol  (PLETAL ) 100 MG tablet Take 1 tablet (100 mg total) by mouth 2 (two) times daily. 180 tablet 3   Multiple Vitamins-Minerals (HAIR SKIN AND NAILS FORMULA) TABS Take 2 tablets by mouth daily.     potassium chloride  (KLOR-CON ) 8 MEQ tablet Take 2 tablets (16 mEq total) by mouth daily. 180 tablet 1   topiramate  (TOPAMAX ) 25 MG tablet Take  1 tablet (25 mg total) by mouth at bedtime. 30 tablet 2   valsartan -hydrochlorothiazide  (DIOVAN -HCT) 320-25 MG tablet Take 1 tablet by mouth daily. 90 tablet 1   Vitamin D , Ergocalciferol , (DRISDOL ) 1.25 MG (50000 UNIT) CAPS capsule Take 1 capsule (50,000 Units total) by mouth every 7 (seven) days. 16 capsule 0   ferrous sulfate  325 (65 FE) MG tablet Take 1 tablet (325 mg total) by mouth 2 (two) times daily with a meal. (Patient not taking: Reported on 08/30/2023) 100 tablet 1   nitroGLYCERIN  (NITROSTAT ) 0.4 MG SL tablet Place 1 tablet (0.4 mg total) under the tongue every 5  (five) minutes as needed. 25 tablet 6   No current facility-administered medications on file prior to visit.    Allergies  Allergen Reactions   Latex Rash    Social History   Socioeconomic History   Marital status: Single    Spouse name: Not on file   Number of children: 0   Years of education: Not on file   Highest education level: GED or equivalent  Occupational History   Occupation: Prep cook  Tobacco Use   Smoking status: Every Day    Current packs/day: 1.00    Average packs/day: 1 pack/day for 50.6 years (50.6 ttl pk-yrs)    Types: Cigarettes    Start date: 62   Smokeless tobacco: Never   Tobacco comments:    5 cigs daily  Vaping Use   Vaping status: Never Used  Substance and Sexual Activity   Alcohol use: Yes    Alcohol/week: 2.0 standard drinks of alcohol    Types: 2 Standard drinks or equivalent per week    Comment: occasional   Drug use: Never   Sexual activity: Not Currently    Birth control/protection: None  Other Topics Concern   Not on file  Social History Narrative   Are you right handed or left handed? Right   Are you currently employed ? Y   What is your current occupation? [employer]   Do you live at home alone? N   Who lives with you?    What type of home do you live in: 1 story or 2 story? 1       Social Drivers of Corporate investment banker Strain: Low Risk  (07/22/2023)   Overall Financial Resource Strain (CARDIA)    Difficulty of Paying Living Expenses: Not hard at all  Food Insecurity: No Food Insecurity (07/22/2023)   Hunger Vital Sign    Worried About Running Out of Food in the Last Year: Never true    Ran Out of Food in the Last Year: Never true  Transportation Needs: No Transportation Needs (07/22/2023)   PRAPARE - Administrator, Civil Service (Medical): No    Lack of Transportation (Non-Medical): No  Physical Activity: Insufficiently Active (07/22/2023)   Exercise Vital Sign    Days of Exercise per Week: 1 day     Minutes of Exercise per Session: 30 min  Stress: No Stress Concern Present (07/22/2023)   [employer] of Occupational Health - Occupational Stress Questionnaire    Feeling of Stress: Not at all  Social Connections: Moderately Integrated (07/22/2023)   Social Connection and Isolation Panel    Frequency of Communication with Friends and Family: Twice a week    Frequency of Social Gatherings with Friends and Family: Once a week    Attends Religious Services: More than 4 times per year    Active Member of [employer]  or Organizations: No    Attends Banker Meetings: Never    Marital Status: Living with partner  Recent Concern: Social Connections - Moderately Isolated (05/23/2023)   Social Connection and Isolation Panel    Frequency of Communication with Friends and Family: Three times a week    Frequency of Social Gatherings with Friends and Family: Twice a week    Attends Religious Services: More than 4 times per year    Active Member of [employer] or Organizations: No    Attends Banker Meetings: Never    Marital Status: Never married  Intimate Partner Violence: Not At Risk (07/22/2023)   Humiliation, Afraid, Rape, and Kick questionnaire    Fear of Current or Ex-Partner: No    Emotionally Abused: No    Physically Abused: No    Sexually Abused: No    Family History  Problem Relation Age of Onset   Heart disease Mother    Hypertension Mother    Hyperlipidemia Mother    Hypertension Brother    Heart disease Maternal Aunt    Hypertension Maternal Aunt    Hyperlipidemia Maternal Aunt    Colon cancer Neg Hx    Liver cancer Neg Hx    Rectal cancer Neg Hx    Stomach cancer Neg Hx     Past Surgical History:  Procedure Laterality Date   ABDOMINAL AORTOGRAM W/LOWER EXTREMITY Bilateral 07/13/2019   Procedure: ABDOMINAL AORTOGRAM W/LOWER EXTREMITY;  Surgeon: Sheree Penne Bruckner, MD;  Location: [facility] INVASIVE CV LAB;  Service: Cardiovascular;  Laterality: Bilateral;    ABDOMINAL AORTOGRAM W/LOWER EXTREMITY N/A 01/08/2022   Procedure: ABDOMINAL AORTOGRAM W/LOWER EXTREMITY;  Surgeon: Lanis Fonda BRAVO, MD;  Location: [facility] INVASIVE CV LAB;  Service: Vascular;  Laterality: N/A;   BREAST EXCISIONAL BIOPSY Left    LESS THAN 5 YEARS AGO   COLONOSCOPY     ENDARTERECTOMY FEMORAL Right 02/20/2022   Procedure: RIGHT COMMON FEMORAL ENDARTERECTOMY WITH VEIN PATCH;  Surgeon: Sheree Penne Bruckner, MD;  Location: [facility] OR;  Service: Vascular;  Laterality: Right;   ENDARTERECTOMY FEMORAL Left 08/10/2022   Procedure: LEFT COMMON FEMORAL ENDARTERECTOMY;  Surgeon: Sheree Penne Bruckner, MD;  Location: [facility] OR;  Service: Vascular;  Laterality: Left;   PERIPHERAL VASCULAR INTERVENTION Left 07/13/2019   Procedure: PERIPHERAL VASCULAR INTERVENTION;  Surgeon: Sheree Penne Bruckner, MD;  Location: [facility] INVASIVE CV LAB;  Service: Cardiovascular;  Laterality: Left;  common iliac    ROS: Review of Systems Negative except as stated above  PHYSICAL EXAM: BP (!) 160/96   Pulse 68   Temp 98.3 F (36.8 C) (Oral)   Ht 5' 4 (1.626 m)   Wt 144 lb (65.3 kg)   SpO2 100%   BMI 24.72 kg/m   Physical Exam  General appearance - alert, well appearing, and in no distress Mental status - normal mood, behavior, speech, dress, motor activity, and thought processes Pelvic - CMA Clarisa is present: normal external genitalia, vulva, vagina, cervix, uterus and adnexa. Oz is partially stenosed.      Latest Ref Rng & Units 05/23/2023    3:34 PM 08/11/2022    1:17 AM 08/10/2022    2:23 PM  CMP  Glucose 70 - 99 mg/dL 93  883    BUN 8 - 27 mg/dL 21  16    Creatinine 9.42 - 1.00 mg/dL 8.69  8.70  8.62   Sodium 134 - 144 mmol/L 139  134    Potassium 3.5 - 5.2 mmol/L 4.2  4.2  Chloride 96 - 106 mmol/L 98  102    CO2 20 - 29 mmol/L 26  24    Calcium  8.7 - 10.3 mg/dL 9.4  8.3    Total Protein 6.0 - 8.5 g/dL 7.6     Total Bilirubin 0.0 - 1.2 mg/dL 0.2     Alkaline Phos 44 - 121 IU/L 72      AST 0 - 40 IU/L 25     ALT 0 - 32 IU/L 12      Lipid Panel     Component Value Date/Time   CHOL 167 08/11/2022 0117   CHOL 173 03/02/2021 1523   TRIG 62 08/11/2022 0117   HDL 46 08/11/2022 0117   HDL 58 03/02/2021 1523   CHOLHDL 3.6 08/11/2022 0117   VLDL 12 08/11/2022 0117   LDLCALC 109 (H) 08/11/2022 0117   LDLCALC 96 03/02/2021 1523    CBC    Component Value Date/Time   WBC 5.1 05/23/2023 1534   WBC 6.2 08/11/2022 0117   RBC 4.32 05/23/2023 1534   RBC 3.78 (L) 08/11/2022 0117   HGB 14.3 05/23/2023 1534   HCT 41.1 05/23/2023 1534   PLT 202 05/23/2023 1534   MCV 95 05/23/2023 1534   MCH 33.1 (H) 05/23/2023 1534   MCH 31.5 08/11/2022 0117   MCHC 34.8 05/23/2023 1534   MCHC 33.1 08/11/2022 0117   RDW 12.8 05/23/2023 1534   LYMPHSABS 2.3 01/22/2023 1104   EOSABS 0.1 01/22/2023 1104   BASOSABS 0.0 01/22/2023 1104    ASSESSMENT AND PLAN: 1. Pap smear for cervical cancer screening (Primary) - Cytology - PAP  2. Essential hypertension Not at goal.  Add hydralazine  10 mg twice a day.  Continue carvedilol  25 mg daily, amlodipine  10 mg daily and valsartan /hydrochlorothiazide  320/25 mg daily. Continue to monitor blood pressure at home. Follow-up with clinical pharmacist in 1 month for recheck. - hydrALAZINE  (APRESOLINE ) 10 MG tablet; Take 1 tablet (10 mg total) by mouth in the morning and at bedtime.  Dispense: 180 tablet; Refill: 1      Patient was given the opportunity to ask questions.  Patient verbalized understanding of the plan and was able to repeat key elements of the plan.   This documentation was completed using Paediatric nurse.  Any transcriptional errors are unintentional.  No orders of the defined types were placed in this encounter.    Requested Prescriptions   Signed Prescriptions Disp Refills   hydrALAZINE  (APRESOLINE ) 10 MG tablet 180 tablet 1    Sig: Take 1 tablet (10 mg total) by mouth in the morning and at bedtime.     Return in about 4 months (around 12/30/2023) for 4 weeks with clinical pharmacist for BP check.  Barnie Louder, MD, FACP

## 2023-09-05 ENCOUNTER — Ambulatory Visit: Payer: Self-pay | Admitting: Family Medicine

## 2023-09-05 LAB — CYTOLOGY - PAP
Adequacy: ABSENT
Comment: NEGATIVE
Diagnosis: NEGATIVE
High risk HPV: NEGATIVE

## 2023-10-02 ENCOUNTER — Telehealth: Payer: Self-pay | Admitting: Internal Medicine

## 2023-10-02 NOTE — Telephone Encounter (Signed)
 Patient confirmed appointment for 10/04/2023, through volunteer call.

## 2023-10-03 NOTE — Progress Notes (Signed)
   S:    PCP: Dr. Vicci  Patient arrives in good spirits. Presents to the clinic for hypertension, counseling, and management. Patient was referred and last seen by Dr. Vicci on 08/30/2023. BP was elevated at that visit at 160/96. Dr. Vicci added hydralazine  10 mg BID to current regimen. PMH of PAD, CAD, emphysema, HTN, HLD, urinary incontinence, IDA, CKD 3b.   Today, pt arrives in good spirits. Admits that she struggles with adherence overall but has been taking her medications regularly since her appt with Dr. Vicci. She took all of her antihypertensives prior to this appointment. Admits that she struggles with adherence and does not like taking BP medications daily.   Denies chest pain, dyspnea, HA, blurred vision.   Current BP Medications include:  amlodipine  10 mg daily, carvedilol  25 mg BID, valsartan -HCTZ 320-25 mg daily, hydralazine  10 mg BID (has not picked this up, unwilling to take medication at this time). Sometimes feels dizzy when they stand up quick/do not eat. States they know how to manage this when it occurs. Is not able to check BP when these episodes occur. Patient feels good about current regimen and would like to remain on it without changes.   BP values from home: 134/91 this morning (pt reported that values usually range around this)   Dietary habits include: Has been lowering their sodium intake. States they have been staying away from processed foods and like to follow a diabetic diet. Does not eat out frequently, tries to eat home-cooked meals as frequently as possible.   Exercise habits include: none.   Family / Social history:  Fhx: HLD, HTN, heart disease  Tobacco: 0.25 PPD smoker  Alcohol: 1 drink standard per today   O:   Vitals:   10/04/23 1156  BP: (!) 147/89  Pulse: 69    Last 3 Office BP readings: BP Readings from Last 3 Encounters:  10/04/23 (!) 147/89  08/30/23 (!) 160/96  08/07/23 (!) 155/94   BMET    Component Value Date/Time    NA 139 05/23/2023 1534   K 4.2 05/23/2023 1534   CL 98 05/23/2023 1534   CO2 26 05/23/2023 1534   GLUCOSE 93 05/23/2023 1534   GLUCOSE 116 (H) 08/11/2022 0117   BUN 21 05/23/2023 1534   CREATININE 1.30 (H) 05/23/2023 1534   CALCIUM  9.4 05/23/2023 1534   GFRNONAA 46 (L) 08/11/2022 0117   GFRAA 79 05/11/2019 1534    Clinical ASCVD: PAD, CAD   A/P: Hypertension currently above goal with a BP today of 147/89 mm Hg (goal < 130/80 mmHg). Medication adherence reported but has been an issue for her in the past. I strongly encouraged adherence. -Continued current medication regimen given improvement. Patient unwilling to take hydralazine  at this time. Advised patient to start taking HTN medications at nighttime to obtain better control throughout the day. Continue taking the carvedilol  BID (once in the morning and once in the evening).  -Counseled on lifestyle modifications for blood pressure control including reduced dietary sodium, increased exercise, adequate sleep.  Total time in face-to-face counseling 30 minutes.    Follow-up with pharmacy in 1 month  PCP appointment: 01/02/24  Patient seen with: Tinnie Norrie, PharmD Candidate   Herlene Fleeta Morris, PharmD, BCACP, CPP Clinical Pharmacist [facility] [phone]

## 2023-10-04 ENCOUNTER — Encounter: Payer: Self-pay | Admitting: Pharmacist

## 2023-10-04 ENCOUNTER — Ambulatory Visit: Attending: Family Medicine | Admitting: Pharmacist

## 2023-10-04 VITALS — BP 147/89 | HR 69

## 2023-10-04 DIAGNOSIS — I10 Essential (primary) hypertension: Secondary | ICD-10-CM

## 2023-11-14 ENCOUNTER — Other Ambulatory Visit: Payer: Self-pay | Admitting: Thoracic Surgery (Cardiothoracic Vascular Surgery)

## 2023-11-14 DIAGNOSIS — J9859 Other diseases of mediastinum, not elsewhere classified: Secondary | ICD-10-CM

## 2023-11-21 ENCOUNTER — Ambulatory Visit: Admitting: Pharmacist

## 2023-12-10 ENCOUNTER — Ambulatory Visit (HOSPITAL_COMMUNITY)

## 2023-12-12 ENCOUNTER — Ambulatory Visit (HOSPITAL_COMMUNITY)
Admission: RE | Admit: 2023-12-12 | Discharge: 2023-12-12 | Disposition: A | Discharge status: Home / Self Care | Source: Ambulatory Visit | Attending: Thoracic Surgery (Cardiothoracic Vascular Surgery) | Admitting: Thoracic Surgery (Cardiothoracic Vascular Surgery)

## 2023-12-12 DIAGNOSIS — J9859 Other diseases of mediastinum, not elsewhere classified: Secondary | ICD-10-CM | POA: Diagnosis not present

## 2023-12-12 DIAGNOSIS — R918 Other nonspecific abnormal finding of lung field: Secondary | ICD-10-CM | POA: Diagnosis not present

## 2023-12-12 DIAGNOSIS — I700 Atherosclerosis of aorta: Secondary | ICD-10-CM | POA: Diagnosis not present

## 2023-12-12 DIAGNOSIS — J432 Centrilobular emphysema: Secondary | ICD-10-CM | POA: Diagnosis not present

## 2023-12-23 ENCOUNTER — Ambulatory Visit

## 2023-12-24 ENCOUNTER — Ambulatory Visit

## 2024-01-01 ENCOUNTER — Telehealth: Payer: Self-pay | Admitting: Internal Medicine

## 2024-01-01 NOTE — Telephone Encounter (Signed)
 CONFIRMED APPT FOR 12/4

## 2024-01-02 ENCOUNTER — Ambulatory Visit

## 2024-01-02 ENCOUNTER — Ambulatory Visit (HOSPITAL_BASED_OUTPATIENT_CLINIC_OR_DEPARTMENT_OTHER): Admitting: Internal Medicine

## 2024-01-02 ENCOUNTER — Ambulatory Visit: Admitting: Internal Medicine

## 2024-01-02 DIAGNOSIS — Z5321 Procedure and treatment not carried out due to patient leaving prior to being seen by health care provider: Secondary | ICD-10-CM

## 2024-01-05 NOTE — Progress Notes (Signed)
 Left before being seen. Pt has rescheduled.

## 2024-01-13 ENCOUNTER — Encounter: Payer: Self-pay | Admitting: Internal Medicine

## 2024-01-13 ENCOUNTER — Ambulatory Visit

## 2024-01-13 ENCOUNTER — Ambulatory Visit: Attending: Internal Medicine | Admitting: Internal Medicine

## 2024-01-13 VITALS — BP 122/72 | HR 71 | Resp 18 | Ht 64.0 in | Wt 145.0 lb

## 2024-01-13 VITALS — BP 157/97 | HR 67 | Ht 64.0 in | Wt 143.0 lb

## 2024-01-13 DIAGNOSIS — R7303 Prediabetes: Secondary | ICD-10-CM | POA: Diagnosis not present

## 2024-01-13 DIAGNOSIS — I10 Essential (primary) hypertension: Secondary | ICD-10-CM | POA: Diagnosis not present

## 2024-01-13 DIAGNOSIS — J9859 Other diseases of mediastinum, not elsewhere classified: Secondary | ICD-10-CM

## 2024-01-13 DIAGNOSIS — J439 Emphysema, unspecified: Secondary | ICD-10-CM | POA: Insufficient documentation

## 2024-01-13 DIAGNOSIS — I739 Peripheral vascular disease, unspecified: Secondary | ICD-10-CM

## 2024-01-13 DIAGNOSIS — I2584 Coronary atherosclerosis due to calcified coronary lesion: Secondary | ICD-10-CM

## 2024-01-13 DIAGNOSIS — I2510 Atherosclerotic heart disease of native coronary artery without angina pectoris: Secondary | ICD-10-CM

## 2024-01-13 LAB — POCT GLYCOSYLATED HEMOGLOBIN (HGB A1C): HbA1c, POC (prediabetic range): 5.8 % (ref 5.7–6.4)

## 2024-01-13 LAB — GLUCOSE, POCT (MANUAL RESULT ENTRY)
POC Glucose: 74 mg/dL (ref 70–99)
POC Glucose: 68 mg/dL — AB (ref 70–99)

## 2024-01-13 MED ORDER — GLUCOSE 4 G PO CHEW: 2.0000 | CHEWABLE_TABLET | Freq: Once | ORAL | Status: AC

## 2024-01-13 NOTE — Progress Notes (Signed)
 [address] [place] [address]             [phone]            Brooke Chapman [id number] Dec 10, 1960   History of Present Illness:  Brooke Chapman is a 63 year old female with medical history of peripheral artery disease, hypertension, CAD, migraine, stage 2 chronic kidney disease, heart murmur, and tobacco use presents for continued follow up of a mediastinal mass.  This was found incidentally on a low-dose CT scan in 2022 for lung cancer screening. She was evaluated by Dr. Kerrin who recommended resection but she refused.  She presents today for continued radiographic follow up.  Mediastinal mass has stayed stable in size.   She presents to the clinic and reports that she has been doing well. She does report having some shortness of breath when trying to lift heavier items off the floor. She denies shortness of breath with exertion and rest.  She denies chest pain and lower leg edema.     Medications Ordered Prior to Encounter[1]   ROS: Review of Systems  Respiratory:  Negative for cough and shortness of breath.   Cardiovascular:  Negative for chest pain, palpitations and leg swelling.     BP 122/72 (BP Location: Left Arm)   Pulse 71   Resp 18   Ht 5' 4 (1.626 m)   Wt 145 lb (65.8 kg)   SpO2 93%   BMI 24.89 kg/m   Physical Exam Constitutional:      Appearance: Normal appearance.  HENT:     Head: Normocephalic and atraumatic.  Cardiovascular:     Rate and Rhythm: Normal rate and regular rhythm.     Heart sounds: S1 normal and S2 normal. Murmur heard.     Systolic murmur is present with a grade of 2/6.  Pulmonary:     Effort: Pulmonary effort is normal.     Breath sounds: Normal breath sounds.  Musculoskeletal:     Cervical back: Normal range of motion.  Skin:    General: Skin is warm and dry.  Neurological:     General: No focal deficit present.     Mental Status: She is alert.      Imaging: EXAM: CT  CHEST WITHOUT CONTRAST 12/12/2023 12:58:47 PM   TECHNIQUE: CT of the chest was performed without the administration of intravenous contrast. Multiplanar reformatted images are provided for review. Automated exposure control, iterative reconstruction, and/or weight based adjustment of the mA/kV was utilized to reduce the radiation dose to as low as reasonably achievable.   COMPARISON: Chest CT of 12/10/2022 and 09/29/2021.   CLINICAL HISTORY: Mediastinal mass.   FINDINGS:   MEDIASTINUM: Borderline cardiomegaly, without pericardial effusion. 3 vessel coronary artery calcification. Aortic valve calcification. Aortic atherosclerosis. The central airways are clear.   LYMPH NODES: No mediastinal, hilar or axillary lymphadenopathy.   LUNGS AND PLEURA: Mass centered about the posteromedial left pleural space of the superior hemithorax measures 3.0 x 1.7 cm on image 23/301. Compare 3.3 x 1.7 cm on the prior. 1.9 cm craniocaudal on sagittal image 99 versus similar. Mild centrilobular emphysema. Left greater than right upper lobes predominant ill defined nodularity is similar, likely due to smoking related respiratory bronchiolitis . Vague, likely subsolid right lower lobe pulmonary nodules x 2 including at maximum of 4 mm are unchanged. No pleural effusion or pneumothorax.   SOFT TISSUES/BONES: No acute abnormality of  the bones or soft tissues.   UPPER ABDOMEN: Limited images of the upper abdomen demonstrate right renal atrophy.   IMPRESSION: 1. Stable size and appearance of pleural based mass within the superomedial upper left hemithorax. 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 2. No acute superimposed process in the chest. 3. Age advanced coronary artery atherosclerosis. Correlate with risk factors and consider medical therapy. 4. Aortic valvular calcifications. Consider echocardiography to evaluate for valvular dysfunction. 5. Aortic atherosclerosis  (icd10-i70.0) and emphysema (icd10-j43.9).   Electronically signed by: Rockey Kilts MD 12/12/2023 01:44 PM EST RP Workstation: [id number]     A/P:  Mediastinal mass -Mass has stayed stable in size when compared to previous years scans.  Mass centered about the posteromedial left pleural space of the superior hemithorax measures 3.0 x 1.7 cm. -Stable over time and consistent with benign etiology -Will continue to monitor with yearly CT scan of chest -Discussed shortness of breath with lifting.  She has heart murmur and mild aortic stenosis on previous echocardiogram.  She is to continue to monitor symptoms. Symptoms of shortness of breath on exertion or rest, dizziness, lower leg swelling and fatigue discussed and if she consistently starts having symptoms then she should reach out to her cardiologist.   Pulmonary Nodules -Stable in size, largest measuring 4 mm -Discussed the importance of smoking cessation -Will continue to follow with CT scans for mediastinal mass     Brooke CHRISTELLA Rough, PA-C 01/13/2024     [1]  Current Outpatient Medications on File Prior to Visit  Medication Sig Dispense Refill   amLODipine  (NORVASC ) 10 MG tablet Take 1 tablet (10 mg total) by mouth daily. 90 tablet 1   aspirin  81 MG chewable tablet Chew 1 tablet (81 mg total) by mouth daily. 100 tablet 1   atorvastatin  (LIPITOR) 40 MG tablet Take 1 tablet (40 mg total) by mouth daily. 90 tablet 2   Blood Pressure Monitor DEVI Use as directed to check home blood pressure 2-3 times a week 1 Device 0   carvedilol  (COREG ) 25 MG tablet Take 1 tablet by mouth twice daily 180 tablet 1   cilostazol  (PLETAL ) 100 MG tablet Take 1 tablet (100 mg total) by mouth 2 (two) times daily. 180 tablet 3   ferrous sulfate  325 (65 FE) MG tablet Take 1 tablet (325 mg total) by mouth 2 (two) times daily with a meal. 100 tablet 1   hydrALAZINE  (APRESOLINE ) 10 MG tablet Take 1 tablet (10 mg total) by mouth in the morning and at  bedtime. 180 tablet 1   Multiple Vitamins-Minerals (HAIR SKIN AND NAILS FORMULA) TABS Take 2 tablets by mouth daily.     nitroGLYCERIN  (NITROSTAT ) 0.4 MG SL tablet Place 1 tablet (0.4 mg total) under the tongue every 5 (five) minutes as needed. 25 tablet 6   potassium chloride  (KLOR-CON ) 8 MEQ tablet Take 2 tablets (16 mEq total) by mouth daily. 180 tablet 1   topiramate  (TOPAMAX ) 25 MG tablet Take 1 tablet (25 mg total) by mouth at bedtime. 30 tablet 2   valsartan -hydrochlorothiazide  (DIOVAN -HCT) 320-25 MG tablet Take 1 tablet by mouth daily. 90 tablet 1   Vitamin D , Ergocalciferol , (DRISDOL ) 1.25 MG (50000 UNIT) CAPS capsule Take 1 capsule (50,000 Units total) by mouth every 7 (seven) days. 16 capsule 0   No current facility-administered medications on file prior to visit.

## 2024-01-13 NOTE — Progress Notes (Unsigned)
 Patient ID: ABISOLA CARRERO, female    DOB: 05/01/1960  MRN: [id number]  CC: Hypertension (HTN & pre-diabetes f/u. /No to flu vax)   Subjective: Deshannon Seide is a 63 y.o. female who presents for chronic ds management. 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 Kerrin, questionably benign schwannoma.  Patient not wanting biopsy.  Plan to monitor with repeat imaging), left renal mass (Saw urologist Dr. Roseann 01/26/2022.  Given stability in size, he thinks it is likely benign). Osteopenia.  Discussed the use of AI scribe software for clinical note transcription with the patient, who gave verbal consent to proceed.  History of Present Illness Jasia Hiltunen is a 63 year old female with hypertension and coronary artery disease who presents for blood pressure management.  HTN: Her blood pressure was 150/92 today, with a goal of maintaining it at 130/80 or lower. Saw Cardiovascular surgeon PA this a.m and BP was 122/72.  She is currently taking carvedilol  25 mg twice a day, amlodipine  10 mg, and valsartan  hydrochlorothiazide  320/25 mg. She has not been taking hydralazine  10 mg twice a day as prescribed on last visit with me. she did not pick it up, believing she did not need it. She checks her blood pressure at home about twice a week and reports it has been in a good range, though she did not bring her readings today.  PreDM:  Results for orders placed or performed in visit on 01/13/24  POCT glucose (manual entry)   Collection Time: 01/13/24  2:34 PM  Result Value Ref Range   POC Glucose 68 (A) 70 - 99 mg/dl  POCT glycosylated hemoglobin (Hb A1C)   Collection Time: 01/13/24  2:34 PM  Result Value Ref Range   Hemoglobin A1C     HbA1c POC (<>  result, manual entry)     HbA1c, POC (prediabetic range) 5.8 5.7 - 6.4 %   HbA1c, POC (controlled diabetic range)    Her blood sugar was low today, and she was asymptomatic. Given  two glucose tablets with repeat BP in the upper 70s. Her A1c is in the prediabetes range. She does not regularly consume sugary drinks, preferring electrolyte water , plain water , and coffee.  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  Thought to be benign and followed by CT surgeon.  CT scan also shoulded advanced coronary artery atherosclerosis, aortic atherosclerosis, aortic valvular calcifications and emphysema changes in the lungs. She is currently taking atorvastatin , Coreg  and ASA. Had echo in 2023 which showed aortic valve calcification. She was previously seen by a cardiologist for chest pains and a heart murmur. No current chest pain or shortness of breath.  PAD: She has peripheral artery disease and is taking Pletal . She experiences leg pain only when walking long distances, approximately two to three blocks, especially in hilly areas.  Tob dep: She is a smoker but has set a goal to quit by the end of December 2025.    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/08/2022   Renal mass; left - non-enhancing, stable in size x 18 months 01/25/2022   Atrophic kidney, right 01/25/2022   CKD (chronic kidney disease) stage 3, GFR 30-59 ml/min (HCC) 11/11/2021   Coronary artery disease 06/15/2021   Chest pain of uncertain etiology 06/15/2021   Cardiac murmur 06/15/2021   Anemia 06/13/2021   Skin lesion 05/26/2021   GAD (generalized  anxiety disorder) 05/01/2020   Black stools 01/13/2020   Unintended weight loss 01/13/2020   Antiplatelet or antithrombotic long-term use 01/13/2020   Hypertension    Frontal balding 12/28/2019   Iron deficiency anemia 12/28/2019   Essential hypertension    Porokeratosis 08/26/2018   PVD (peripheral vascular disease) 08/26/2018   Hyperlipidemia 05/02/2018   HTN (hypertension) 05/01/2018   Tobacco dependence 05/01/2018   PAD (peripheral artery disease)      Medications Ordered Prior to Encounter[1]  Allergies[2]  Social History   Socioeconomic History   Marital status: Single    Spouse name: Not on file   Number of children: 0   Years of education: Not on file   Highest education level: GED or equivalent  Occupational History   Occupation: Prep cook  Tobacco Use   Smoking status: Every Day    Current packs/day: 1.00    Average packs/day: 1 pack/day for 51.0 years (51.0 ttl pk-yrs)    Types: Cigarettes    Start date: 1975   Smokeless tobacco: Never   Tobacco comments:    5 cigs daily  Vaping Use   Vaping status: Never Used  Substance and Sexual Activity   Alcohol use: Yes    Alcohol/week: 2.0 standard drinks of alcohol    Types: 2 Standard drinks or equivalent per week    Comment: occasional   Drug use: Never   Sexual activity: Not Currently    Birth control/protection: None  Other Topics Concern   Not on file  Social History Narrative   Are you right handed or left handed? Right   Are you currently employed ? Y   What is your current occupation? [employer]   Do you live at home alone? N   Who lives with you?    What type of home do you live in: 1 story or 2 story? 1       Social Drivers of Health   Tobacco Use: High Risk (01/13/2024)   Patient History    Smoking Tobacco Use: Every Day    Smokeless Tobacco Use: Never    Passive Exposure: Not on file  Financial Resource Strain: Low Risk (01/10/2024)   Overall Financial Resource Strain (CARDIA)     Difficulty of Paying Living Expenses: Not hard at all  Food Insecurity: No Food Insecurity (01/10/2024)   Epic    Worried About Radiation Protection Practitioner of Food in the Last Year: Never true    Ran Out of Food in the Last Year: Never true  Transportation Needs: No Transportation Needs (01/10/2024)   Epic    Lack of Transportation (Medical): No    Lack of Transportation (Non-Medical): No  Physical Activity: Insufficiently Active (01/10/2024)   Exercise Vital Sign    Days of Exercise per Week: 3 days    Minutes of Exercise per Session: 40 min  Stress: No Stress Concern Present (01/10/2024)   [employer] of Occupational Health - Occupational Stress Questionnaire    Feeling of Stress: Only a  little  Social Connections: Moderately Integrated (01/10/2024)   Social Connection and Isolation Panel    Frequency of Communication with Friends and Family: Twice a week    Frequency of Social Gatherings with Friends and Family: Once a week    Attends Religious Services: More than 4 times per year    Active Member of Clubs or Organizations: No    Attends Banker Meetings: Not on file    Marital Status: Living with partner  Intimate Partner Violence: Not At Risk (07/22/2023)   Epic    Fear of Current or Ex-Partner: No    Emotionally Abused: No    Physically Abused: No    Sexually Abused: No  Depression (PHQ2-9): Low Risk (08/30/2023)   Depression (PHQ2-9)    PHQ-2 Score: 0  Alcohol Screen: Low Risk (01/10/2024)   Alcohol Screen    Last Alcohol Screening Score (AUDIT): 3  Housing: Low Risk (01/10/2024)   Epic    Unable to Pay for Housing in the Last Year: No    Number of Times Moved in the Last Year: 0    Homeless in the Last Year: No  Utilities: Not At Risk (07/22/2023)   Epic    Threatened with loss of utilities: No  Health Literacy: Adequate Health Literacy (07/22/2023)   B1300 Health Literacy    Frequency of need for help with medical instructions: Never    Family History  Problem  Relation Age of Onset   Heart disease Mother    Hypertension Mother    Hyperlipidemia Mother    Hypertension Brother    Heart disease Maternal Aunt    Hypertension Maternal Aunt    Hyperlipidemia Maternal Aunt    Colon cancer Neg Hx    Liver cancer Neg Hx    Rectal cancer Neg Hx    Stomach cancer Neg Hx     Past Surgical History:  Procedure Laterality Date   ABDOMINAL AORTOGRAM W/LOWER EXTREMITY Bilateral 07/13/2019   Procedure: ABDOMINAL AORTOGRAM W/LOWER EXTREMITY;  Surgeon: Sheree Penne Bruckner, MD;  [facility] INVASIVE CV LAB;  Service: Cardiovascular;  Laterality: Bilateral;   ABDOMINAL AORTOGRAM W/LOWER EXTREMITY N/A 01/08/2022   Procedure: ABDOMINAL AORTOGRAM W/LOWER EXTREMITY;  Surgeon: Lanis Fonda BRAVO, MD;  Location: [facility] INVASIVE CV LAB;  Service: Vascular;  Laterality: N/A;   BREAST EXCISIONAL BIOPSY Left    LESS THAN 5 YEARS AGO   COLONOSCOPY     ENDARTERECTOMY FEMORAL Right 02/20/2022   Procedure: RIGHT COMMON FEMORAL ENDARTERECTOMY WITH VEIN PATCH;  Surgeon: Sheree Penne Bruckner, MD;  Location: [facility] OR;  Service: Vascular;  Laterality: Right;   ENDARTERECTOMY FEMORAL Left 08/10/2022   Procedure: LEFT COMMON FEMORAL ENDARTERECTOMY;  Surgeon: Sheree Penne Bruckner, MD;  Location: [facility] OR;  Service: Vascular;  Laterality: Left;   PERIPHERAL VASCULAR INTERVENTION Left 07/13/2019   Procedure: PERIPHERAL VASCULAR INTERVENTION;  Surgeon: Sheree Penne Bruckner, MD;  Location: [facility] INVASIVE CV LAB;  Service: Cardiovascular;  Laterality: Left;  common iliac    ROS: Review of Systems Negative except as stated above  PHYSICAL EXAM: BP (!) 157/97   Pulse 67   Ht 5' 4 (1.626 m)   Wt 143 lb (64.9 kg)   SpO2 99%   BMI 24.55 kg/m   Wt Readings from Last 3 Encounters:  01/13/24 143 lb (64.9 kg)  01/13/24 145 lb (65.8 kg)  08/30/23 144 lb (65.3 kg)    Physical Exam  General appearance - alert, well appearing, and in no distress Mental status - normal  mood,  behavior, speech, dress, motor activity, and thought processes Neck - supple, no significant adenopathy Chest - clear to auscultation, no wheezes, rales or rhonchi, symmetric air entry Heart - normal rate, regular rhythm, normal S1, S2, no murmurs, rubs, clicks or gallops Extremities - peripheral pulses normal, no pedal edema, no clubbing or cyanosis      Latest Ref Rng & Units 05/23/2023    3:34 PM 08/11/2022    1:17 AM 08/10/2022    2:23 PM  CMP  Glucose 70 - 99 mg/dL 93  883    BUN 8 - 27 mg/dL 21  16    Creatinine 9.42 - 1.00 mg/dL 8.69  8.70  8.62   Sodium 134 - 144 mmol/L 139  134    Potassium 3.5 - 5.2 mmol/L 4.2  4.2    Chloride 96 - 106 mmol/L 98  102    CO2 20 - 29 mmol/L 26  24    Calcium  8.7 - 10.3 mg/dL 9.4  8.3    Total Protein 6.0 - 8.5 g/dL 7.6     Total Bilirubin 0.0 - 1.2 mg/dL 0.2     Alkaline Phos 44 - 121 IU/L 72     AST 0 - 40 IU/L 25     ALT 0 - 32 IU/L 12      Lipid Panel     Component Value Date/Time   CHOL 167 08/11/2022 0117   CHOL 173 03/02/2021 1523   TRIG 62 08/11/2022 0117   HDL 46 08/11/2022 0117   HDL 58 03/02/2021 1523   CHOLHDL 3.6 08/11/2022 0117   VLDL 12 08/11/2022 0117   LDLCALC 109 (H) 08/11/2022 0117   LDLCALC 96 03/02/2021 1523    CBC    Component Value Date/Time   WBC 5.1 05/23/2023 1534   WBC 6.2 08/11/2022 0117   RBC 4.32 05/23/2023 1534   RBC 3.78 (L) 08/11/2022 0117   HGB 14.3 05/23/2023 1534   HCT 41.1 05/23/2023 1534   PLT 202 05/23/2023 1534   MCV 95 05/23/2023 1534   MCH 33.1 (H) 05/23/2023 1534   MCH 31.5 08/11/2022 0117   MCHC 34.8 05/23/2023 1534   MCHC 33.1 08/11/2022 0117   RDW 12.8 05/23/2023 1534   LYMPHSABS 2.3 01/22/2023 1104   EOSABS 0.1 01/22/2023 1104   BASOSABS 0.0 01/22/2023 1104    ASSESSMENT AND PLAN: 1. Essential hypertension (Primary) Not at goal but had normal reading today at visit with CT surgeon and reports good readings at home. She never did take the Hydralazine  and not interested  in doing so so this was removed from list. Continue carvedilol , valsartan -hydrochlorothiazide , and amlodipine . - Monitor blood pressure at home twice weekly. - Schedule follow-up with clinical pharmacist in six weeks. - Basic Metabolic Panel  2. Prediabetes Healthy eating habit discussed and encourage - POCT glucose (manual entry) - POCT glycosylated hemoglobin (Hb A1C) - POCT glucose (manual entry) - glucose chewable tablet 8 g  3. Emphysema, unspecified (HCC) Asymptomatic. Continue to encourage smoking cessation.  4. PAD (peripheral artery disease) Continue Pletal  and Lipitor  5. Mediastinal mass Saw CT surgeon PA recently.  Their assessment: Stable over time and consistent with benign etiology -Will continue to monitor with yearly CT scan of chest  6. Coronary atherosclerosis due to calcified coronary lesion Stable Continue Lipitor, Coreg  and ASA  Patient was given the opportunity to ask questions.  Patient verbalized understanding of the plan and was able to repeat key elements of the plan.  This documentation was completed using Paediatric nurse.  Any transcriptional errors are unintentional.  Orders Placed This Encounter  Procedures   Basic Metabolic Panel   POCT glucose (manual entry)   POCT glycosylated hemoglobin (Hb A1C)   POCT glucose (manual entry)     Requested Prescriptions    No prescriptions requested or ordered in this encounter    Return in about 4 months (around 05/13/2024) for Appt with [facility] in 6 wks for BP check and blood test for kidey function.SABRA Barnie Louder, MD, FACP     [1]  Current Outpatient Medications on File Prior to Visit  Medication Sig Dispense Refill   amLODipine  (NORVASC ) 10 MG tablet Take 1 tablet (10 mg total) by mouth daily. 90 tablet 1   aspirin  81 MG chewable tablet Chew 1 tablet (81 mg total) by mouth daily. 100 tablet 1   atorvastatin  (LIPITOR) 40 MG tablet Take 1 tablet (40 mg total) by mouth  daily. 90 tablet 2   Blood Pressure Monitor DEVI Use as directed to check home blood pressure 2-3 times a week 1 Device 0   carvedilol  (COREG ) 25 MG tablet Take 1 tablet by mouth twice daily 180 tablet 1   cilostazol  (PLETAL ) 100 MG tablet Take 1 tablet (100 mg total) by mouth 2 (two) times daily. 180 tablet 3   ferrous sulfate  325 (65 FE) MG tablet Take 1 tablet (325 mg total) by mouth 2 (two) times daily with a meal. 100 tablet 1   Multiple Vitamins-Minerals (HAIR SKIN AND NAILS FORMULA) TABS Take 2 tablets by mouth daily.     nitroGLYCERIN  (NITROSTAT ) 0.4 MG SL tablet Place 1 tablet (0.4 mg total) under the tongue every 5 (five) minutes as needed. 25 tablet 6   potassium chloride  (KLOR-CON ) 8 MEQ tablet Take 2 tablets (16 mEq total) by mouth daily. 180 tablet 1   topiramate  (TOPAMAX ) 25 MG tablet Take 1 tablet (25 mg total) by mouth at bedtime. 30 tablet 2   valsartan -hydrochlorothiazide  (DIOVAN -HCT) 320-25 MG tablet Take 1 tablet by mouth daily. 90 tablet 1   Vitamin D , Ergocalciferol , (DRISDOL ) 1.25 MG (50000 UNIT) CAPS capsule Take 1 capsule (50,000 Units total) by mouth every 7 (seven) days. 16 capsule 0   No current facility-administered medications on file prior to visit.  [2]  Allergies Allergen Reactions   Latex Rash

## 2024-01-13 NOTE — Patient Instructions (Signed)
-  Follow up in one year with CT scan of chest

## 2024-01-13 NOTE — Patient Instructions (Signed)
°  VISIT SUMMARY: Today, we discussed your blood pressure management and reviewed your current medications. Your blood pressure was 150/92, and our goal is to maintain it at 130/80 or lower. We also addressed your emphysema, coronary artery disease, aortic valve calcification, peripheral artery disease, mediastinal pleural mass, and prediabetes.  YOUR PLAN: -HYPERTENSION: Hypertension means high blood pressure. Your blood pressure is variable but controlled when you take your medication consistently. Please continue taking carvedilol , valsartan -hydrochlorothiazide , and amlodipine  as prescribed. Monitor your blood pressure at home twice a week and follow up with the clinical pharmacist in six weeks.  -EMPHYSEMA: Emphysema is a lung condition that causes shortness of breath and is often related to smoking. Your CT scan shows emphysema consistent with your smoking history. It is important to quit smoking to prevent further progression. You have set a goal to quit by the end of December.  -CORONARY ARTERY DISEASE: Coronary artery disease is a condition where the blood vessels supplying the heart are narrowed or blocked. You have advanced atherosclerosis but no current symptoms. Please continue taking atorvastatin  and carvedilol  as prescribed.  -AORTIC VALVE CALCIFICATION: Aortic valve calcification is the buildup of calcium  deposits on the aortic valve in the heart. You have moderate calcification with no symptoms of dysfunction. We may consider a repeat echocardiogram to assess the valve status.  -PERIPHERAL ARTERY DISEASE: Peripheral artery disease is a condition where the blood vessels outside your heart and brain are narrowed or blocked. You experience leg pain when walking long distances. Please continue taking Pletal  as prescribed.  -MEDIASTINAL PLEURAL MASS: A mediastinal pleural mass is an abnormal growth in the area between your lungs. Your mass is stable with no changes on recent imaging. We will  continue monitoring it with follow-up imaging as needed.  -PREDIABETES: Prediabetes means your blood sugar levels are higher than normal but not high enough to be classified as diabetes. Your A1c is in the prediabetes range, and you had a recent low blood sugar episode. We rechecked your blood sugar before you left the clinic.  INSTRUCTIONS: Please follow up with the clinical pharmacist in six weeks for blood pressure management. Continue monitoring your blood pressure at home twice a week. Consider scheduling a repeat echocardiogram to assess your aortic valve status. Continue with your current medications and work towards your goal of quitting smoking by the end of December 2025.                      Contains text generated by [employer]bridge.                                 Contains text generated by [employer]bridge.

## 2024-02-17 ENCOUNTER — Ambulatory Visit: Payer: Self-pay

## 2024-02-17 NOTE — Telephone Encounter (Signed)
 FYI Only or Action Required?: FYI only for provider: appointment scheduled on tomorrow.  Patient was last seen in primary care on 01/13/2024 by Vicci Barnie NOVAK, MD.  Called Nurse Triage reporting Migraine.  Symptoms began ongoing migraine - had one yesterday and went to ed. . Another may be starting now Interventions attempted:  Went to ED.  Symptoms are: unchanged.  Triage Disposition: See Physician Within 24 Hours  Patient/caregiver understands and will follow disposition?: Yes                         Reason for Triage: Was seen for severe migraine in the ED over the weekend. Left eye swelling/pain.  Reason for Disposition  [1] MODERATE headache (e.g., interferes with normal activities) AND [2] present > 24 hours AND [3] unexplained  (Exceptions: Pain medicines not tried, typical migraine, or headache part of viral illness.)  Protocols used: Headache-A-AH

## 2024-02-17 NOTE — Telephone Encounter (Signed)
 Noted.   Patient has appointment tomorrow to discuss

## 2024-02-18 ENCOUNTER — Ambulatory Visit: Admitting: Nurse Practitioner

## 2024-02-18 ENCOUNTER — Encounter: Payer: Self-pay | Admitting: Nurse Practitioner

## 2024-02-18 VITALS — BP 132/77 | HR 66 | Wt 133.0 lb

## 2024-02-18 DIAGNOSIS — H1013 Acute atopic conjunctivitis, bilateral: Secondary | ICD-10-CM

## 2024-02-18 DIAGNOSIS — F17200 Nicotine dependence, unspecified, uncomplicated: Secondary | ICD-10-CM

## 2024-02-18 DIAGNOSIS — R519 Headache, unspecified: Secondary | ICD-10-CM

## 2024-02-18 DIAGNOSIS — H6121 Impacted cerumen, right ear: Secondary | ICD-10-CM

## 2024-02-18 DIAGNOSIS — I10 Essential (primary) hypertension: Secondary | ICD-10-CM

## 2024-02-18 DIAGNOSIS — F17210 Nicotine dependence, cigarettes, uncomplicated: Secondary | ICD-10-CM

## 2024-02-18 DIAGNOSIS — Z09 Encounter for follow-up examination after completed treatment for conditions other than malignant neoplasm: Secondary | ICD-10-CM | POA: Insufficient documentation

## 2024-02-18 MED ORDER — OLOPATADINE HCL 0.1 % OP SOLN: 1.0000 [drp] | Freq: Two times a day (BID) | OPHTHALMIC | 0 refills | Status: DC

## 2024-02-18 NOTE — Assessment & Plan Note (Addendum)
" °  Intermittent migraines managed with Topamax . Advised daily use for prevention. Triggered by insufficient eating. Normal recent CT scan. - Advised to take Topamax25mg  daily for migraine prevention and not as needed - Recommended Tylenol  or ibuprofen as needed for headache relief. - Encouraged regular meals to prevent headaches.  Need for adequate rest discussed  "

## 2024-02-18 NOTE — Assessment & Plan Note (Signed)
" °  Acute redness and watering of the left eye, likely allergic conjunctivitis. No infection signs. - Prescribed eye drops for allergic conjunctivitis. - Advised to see an eye doctor if pain or discomfort worsens.  "

## 2024-02-18 NOTE — Progress Notes (Signed)
 "  Acute Office Visit  Subjective:     Patient ID: Brooke Chapman, female    DOB: 1961-01-17, 64 y.o.   MRN: [id number]  Chief Complaint  Patient presents with   Ear Pain    Feels better    HPI   Discussed the use of AI scribe software for clinical note transcription with the patient, who gave verbal consent to proceed.  History of Present Illness Brooke Chapman is a 64 year old female  has a past medical history of Anemia, Antiplatelet or antithrombotic long-term use (01/13/2020), Aortic stenosis (07/05/2021), Black stools (01/13/2020), CKD (chronic kidney disease), Frontal balding (12/28/2019), GAD (generalized anxiety disorder) (05/01/2020), Headache, HTN (hypertension) (05/01/2018), Hyperlipidemia, Iron deficiency anemia (12/28/2019), Mediastinal mass (09/29/2021), PAD (peripheral artery disease), Porokeratosis (08/26/2018), Pre-diabetes, PVD (peripheral vascular disease) (08/26/2018), Skin lesion (05/26/2021), Tobacco dependence (05/01/2018), and Unintended weight loss (01/13/2020).  who presents with neck swelling and headache.  She has experienced swellings on both sides of her neck, with the left side being slightly more swollen two days ago. She also had left ear pain on Sunday, initially thought to be a sign of a cold, but the ear pain has since resolved with no discharge noted.  She describes a headache that began on Sunday at 1 PM, which worsened despite taking her medication. The headache persisted, leading her to seek care at the emergency room two days ago, where a CT scan was performed and found to be normal. Her blood pressure was high at that time; she reported she had not taken her medication. She has been prescribed Topamax  for migraines, which she takes only when she feels a headache coming on. She notes that not eating enough can trigger her headaches, and she tries to eat small meals every two to three hours to prevent them.  She has noticed redness in her  eyes, which she attributes to an episode she had. There is no itching, but she reports slight pain in the left eye when touched. The redness started today, and she has not used any eye drops  Her eyes are watery but not itchy.  She reports a history of sinus issues and mentions being very congested on Sunday and Monday, particularly on one side of her nose. She used a hot pot with a towel over her head to relieve the congestion, which improved by Tuesday morning.  She smokes one pack of cigarettes a day and has been smoking since she was younger than [age]. She is trying to reduce her smoking.    Assessment & Plan     Review of Systems  Constitutional:  Negative for appetite change, chills, fatigue and fever.  HENT:  Negative for congestion, postnasal drip, rhinorrhea and sneezing.   Eyes:  Positive for redness.  Respiratory:  Negative for cough, shortness of breath and wheezing.   Cardiovascular:  Negative for chest pain, palpitations and leg swelling.  Gastrointestinal:  Negative for abdominal pain, constipation, nausea and vomiting.  Genitourinary:  Negative for difficulty urinating, dysuria, flank pain and frequency.  Musculoskeletal:  Negative for arthralgias, back pain, joint swelling and myalgias.  Skin:  Negative for color change, pallor, rash and wound.  Neurological:  Negative for dizziness, facial asymmetry, weakness, numbness and headaches.  Psychiatric/Behavioral:  Negative for behavioral problems, confusion, self-injury and suicidal ideas.         Objective:    BP 132/77   Pulse 66   Wt 133 lb (60.3 kg)   SpO2 100%  BMI 22.83 kg/m    Physical Exam Vitals and nursing note reviewed.  Constitutional:      General: She is not in acute distress.    Appearance: Normal appearance. She is not ill-appearing, toxic-appearing or diaphoretic.  HENT:     Right Ear: Tympanic membrane, ear canal and external ear normal. There is impacted cerumen.     Left Ear: Tympanic  membrane, ear canal and external ear normal. There is no impacted cerumen.     Nose: No congestion or rhinorrhea.     Mouth/Throat:     Mouth: Mucous membranes are moist.     Tongue: No lesions. Tongue does not deviate from midline.     Pharynx: Oropharynx is clear.  Eyes:     General:        Right eye: No discharge.        Left eye: No discharge.     Extraocular Movements: Extraocular movements intact.     Conjunctiva/sclera:     Right eye: Right conjunctiva is injected. No exudate or hemorrhage.    Left eye: Left conjunctiva is injected. No exudate or hemorrhage.    Comments: Increased tearing both eyes  Cardiovascular:     Rate and Rhythm: Normal rate and regular rhythm.     Pulses: Normal pulses.     Heart sounds: Normal heart sounds. No murmur heard.    No friction rub. No gallop.  Pulmonary:     Effort: Pulmonary effort is normal. No respiratory distress.     Breath sounds: Normal breath sounds. No stridor. No wheezing, rhonchi or rales.  Chest:     Chest wall: No tenderness.  Abdominal:     General: There is no distension.     Palpations: Abdomen is soft.     Tenderness: There is no abdominal tenderness. There is no right CVA tenderness, left CVA tenderness or guarding.  Musculoskeletal:        General: No swelling, tenderness, deformity or signs of injury.     Right lower leg: No edema.     Left lower leg: No edema.  Skin:    General: Skin is warm and dry.     Capillary Refill: Capillary refill takes less than 2 seconds.     Coloration: Skin is not jaundiced or pale.     Findings: No bruising, erythema or lesion.  Neurological:     Mental Status: She is alert and oriented to person, place, and time.     Motor: No weakness.     Coordination: Coordination normal.     Gait: Gait normal.  Psychiatric:        Mood and Affect: Mood normal.        Behavior: Behavior normal.        Thought Content: Thought content normal.        Judgment: Judgment normal.     No  results found for any visits on 02/18/24.      Assessment & Plan:   Problem List Items Addressed This Visit       Cardiovascular and Mediastinum   Essential hypertension   BP Readings from Last 3 Encounters:  02/18/24 132/77  01/13/24 (!) 157/97  01/13/24 122/72  Continue amlodipine  10 mg daily, carvedilol  25 mg twice daily, Diovan -HCT 320-25 mg 1 tablet daily Maintain close follow-up with PCP        Nervous and Auditory   Impacted cerumen, right ear   Declined here [place] Use of Debrox advised  Other   Tobacco use disorder   Smokes about 1 pack/day  Asked about quitting: confirms that she currently smokes cigarettes Advise to quit smoking: Educated about QUITTING to reduce the risk of cancer, cardio and cerebrovascular disease. Assess willingness: Unwilling to quit at this time, but is working on cutting back. Assist with counseling and pharmacotherapy: Counseled for 5 minutes and literature provided. Arrange for follow up: follow up with PCP and continue to offer help.       Acute nonintractable headache    Intermittent migraines managed with Topamax . Advised daily use for prevention. Triggered by insufficient eating. Normal recent CT scan. - Advised to take Topamax25mg  daily for migraine prevention and not as needed - Recommended Tylenol  or ibuprofen as needed for headache relief. - Encouraged regular meals to prevent headaches.  Need for adequate rest discussed       Allergic conjunctivitis of both eyes - Primary    Acute redness and watering of the left eye, likely allergic conjunctivitis. No infection signs. - Prescribed eye drops for allergic conjunctivitis. - Advised to see an eye doctor if pain or discomfort worsens.       Relevant Medications   olopatadine  (PATANOL) 0.1 % ophthalmic solution   Encounter for examination following treatment at hospital   Hospital chart reviewed, including discharge summary Medications reconciled and reviewed  with the patient in detail        Meds ordered this encounter  Medications   olopatadine  (PATANOL) 0.1 % ophthalmic solution    Sig: Place 1 drop into both eyes 2 (two) times daily.    Dispense:  5 mL    Refill:  0    No follow-ups on file.  Kaziyah Parkison R Burney Calzadilla, FNP  "

## 2024-02-18 NOTE — Assessment & Plan Note (Signed)
 Hospital chart reviewed, including discharge summary Medications reconciled and reviewed with the patient in detail

## 2024-02-18 NOTE — Assessment & Plan Note (Signed)
 Smokes about 1 pack/day  Asked about quitting: confirms that she currently smokes cigarettes Advise to quit smoking: Educated about QUITTING to reduce the risk of cancer, cardio and cerebrovascular disease. Assess willingness: Unwilling to quit at this time, but is working on cutting back. Assist with counseling and pharmacotherapy: Counseled for 5 minutes and literature provided. Arrange for follow up: follow up with PCP and continue to offer help.

## 2024-02-18 NOTE — Assessment & Plan Note (Signed)
 BP Readings from Last 3 Encounters:  02/18/24 132/77  01/13/24 (!) 157/97  01/13/24 122/72  Continue amlodipine  10 mg daily, carvedilol  25 mg twice daily, Diovan -HCT 320-25 mg 1 tablet daily Maintain close follow-up with PCP

## 2024-02-18 NOTE — Assessment & Plan Note (Signed)
 Declined here [place] Use of Debrox advised

## 2024-02-18 NOTE — Patient Instructions (Signed)
 OK to use Debrox (peroxide) in the ear to loosen up wax Do not use Q-tips as this can impact wax further.   1. Allergic conjunctivitis of both eyes (Primary) - olopatadine  (PATANOL) 0.1 % ophthalmic solution; Place 1 drop into both eyes 2 (two) times daily.  Dispense: 5 mL; Refill: 0  2. Acute nonintractable headache, unspecified headache type  3. Tobacco use disorder    It is important that you exercise regularly at least 30 minutes 5 times a week as tolerated  Think about what you will eat, plan ahead. Choose  clean, green, fresh or frozen over canned, processed or packaged foods which are more sugary, salty and fatty. 70 to 75% of food eaten should be vegetables and fruit. Three meals at set times with snacks allowed between meals, but they must be fruit or vegetables. Aim to eat over a 12 hour period , example 7 am to 7 pm, and STOP after  your last meal of the day. Drink water ,generally about 64 ounces per day, no other drink is as healthy. Fruit juice is best enjoyed in a healthy way, by EATING the fruit.  Thanks for choosing [facility] we consider it a privelige to serve you.

## 2024-02-25 ENCOUNTER — Other Ambulatory Visit: Payer: Self-pay | Admitting: Internal Medicine

## 2024-02-25 DIAGNOSIS — G43109 Migraine with aura, not intractable, without status migrainosus: Secondary | ICD-10-CM

## 2024-02-25 NOTE — Telephone Encounter (Unsigned)
 Copied from CRM [id number]. Topic: Clinical - Medication Refill >> Feb 25, 2024 11:17 AM Larissa RAMAN wrote: Medication: topiramate  (TOPAMAX ) 25 MG tablet  Has the patient contacted their pharmacy? Yes (Agent: If no, request that the patient contact the pharmacy for the refill. If patient does not wish to contact the pharmacy document the reason why and proceed with request.) (Agent: If yes, when and what did the pharmacy advise?)  This is the patient's preferred pharmacy:  [id number] - [place], [place] - [address] [place] [address] Phone: [phone] Fax: [phone]  Is this the correct pharmacy for this prescription? Yes If no, delete pharmacy and type the correct one.   Has the prescription been filled recently? No  Is the patient out of the medication? Yes  Has the patient been seen for an appointment in the last year OR does the patient have an upcoming appointment? Yes  Can we respond through MyChart? Yes  Agent: Please be advised that Rx refills may take up to 3 business days. We ask that you follow-up with your pharmacy.

## 2024-02-26 MED ORDER — TOPIRAMATE 25 MG PO TABS: 25.0000 mg | ORAL_TABLET | Freq: Every day | ORAL | 0 refills | Status: DC

## 2024-02-26 NOTE — Telephone Encounter (Signed)
 Requested Prescriptions  Pending Prescriptions Disp Refills   topiramate  (TOPAMAX ) 25 MG tablet 90 tablet 0    Sig: Take 1 tablet (25 mg total) by mouth at bedtime.     Neurology: Anticonvulsants - topiramate  & zonisamide Failed - 02/26/2024 10:55 AM      Failed - Cr in normal range and within 360 days    Creatinine, Ser  Date Value Ref Range Status  05/23/2023 1.30 (H) 0.57 - 1.00 mg/dL Final         Passed - CO2 in normal range and within 360 days    CO2  Date Value Ref Range Status  05/23/2023 26 20 - 29 mmol/L Final         Passed - ALT in normal range and within 360 days    ALT  Date Value Ref Range Status  05/23/2023 12 0 - 32 IU/L Final         Passed - AST in normal range and within 360 days    AST  Date Value Ref Range Status  05/23/2023 25 0 - 40 IU/L Final         Passed - Completed PHQ-2 or PHQ-9 in the last 360 days      Passed - Valid encounter within last 12 months    Recent Outpatient Visits           1 month ago Essential hypertension   [facility] [place]. [facility] Vicci Barnie NOVAK, MD   1 month ago Patient left before evaluation by physician   [facility] [place]. [facility] Vicci Barnie NOVAK, MD   4 months ago Primary hypertension   [facility] [place]. [facility] Fleeta Morris, [place] L, RPH-CPP   6 months ago Pap smear for cervical cancer screening   [facility] [place]. [facility] Vicci Barnie NOVAK, MD   7 months ago Encounter for [employer] annual wellness exam   [facility] Of [place]. [facility] Vicci Barnie NOVAK, MD

## 2024-02-27 ENCOUNTER — Ambulatory Visit: Payer: Self-pay | Attending: Internal Medicine | Admitting: Pharmacist

## 2024-02-27 ENCOUNTER — Encounter: Payer: Self-pay | Admitting: Pharmacist

## 2024-02-27 VITALS — BP 164/96 | HR 65

## 2024-02-27 DIAGNOSIS — R079 Chest pain, unspecified: Secondary | ICD-10-CM | POA: Diagnosis not present

## 2024-02-27 DIAGNOSIS — G43109 Migraine with aura, not intractable, without status migrainosus: Secondary | ICD-10-CM | POA: Diagnosis not present

## 2024-02-27 DIAGNOSIS — I2510 Atherosclerotic heart disease of native coronary artery without angina pectoris: Secondary | ICD-10-CM

## 2024-02-27 DIAGNOSIS — D508 Other iron deficiency anemias: Secondary | ICD-10-CM | POA: Diagnosis not present

## 2024-02-27 DIAGNOSIS — I10 Essential (primary) hypertension: Secondary | ICD-10-CM | POA: Diagnosis not present

## 2024-02-27 DIAGNOSIS — I739 Peripheral vascular disease, unspecified: Secondary | ICD-10-CM

## 2024-02-27 DIAGNOSIS — E876 Hypokalemia: Secondary | ICD-10-CM

## 2024-02-27 MED ORDER — ASPIRIN 81 MG PO CHEW: 81.0000 mg | CHEWABLE_TABLET | Freq: Every day | ORAL | 1 refills | Status: AC

## 2024-02-27 MED ORDER — CARVEDILOL 25 MG PO TABS: 25.0000 mg | ORAL_TABLET | Freq: Two times a day (BID) | ORAL | 1 refills | Status: AC

## 2024-02-27 MED ORDER — NITROGLYCERIN 0.4 MG SL SUBL: 0.4000 mg | SUBLINGUAL_TABLET | SUBLINGUAL | 6 refills | Status: AC | PRN

## 2024-02-27 MED ORDER — CILOSTAZOL 100 MG PO TABS: 100.0000 mg | ORAL_TABLET | Freq: Two times a day (BID) | ORAL | 3 refills | Status: AC

## 2024-02-27 MED ORDER — VALSARTAN-HYDROCHLOROTHIAZIDE 320-25 MG PO TABS: 1.0000 | ORAL_TABLET | Freq: Every day | ORAL | 1 refills | Status: AC

## 2024-02-27 MED ORDER — ATORVASTATIN CALCIUM 40 MG PO TABS: 40.0000 mg | ORAL_TABLET | Freq: Every day | ORAL | 2 refills | Status: AC

## 2024-02-27 MED ORDER — POTASSIUM CHLORIDE ER 8 MEQ PO TBCR: 16.0000 meq | EXTENDED_RELEASE_TABLET | Freq: Every day | ORAL | 1 refills | Status: AC

## 2024-02-27 MED ORDER — AMLODIPINE BESYLATE 10 MG PO TABS: 10.0000 mg | ORAL_TABLET | Freq: Every day | ORAL | 1 refills | Status: AC

## 2024-02-27 NOTE — Progress Notes (Signed)
" ° °  S:    PCP: Dr. Vicci  Patient arrives in good spirits. Presents to the clinic for hypertension, counseling, and management.   Patient was referred and last seen by Dr. Vicci on 01/13/2024. BP was elevated at that visit at 157/97 mmhg but it was noted that she had a good BP reading earlier that day during a visit with Cardiothoracic surgery. No changes were made to her medications.   PMH of PAD, CAD, emphysema, HTN, HLD, urinary incontinence, IDA, CKD 3b.   Today, pt arrives in good spirits. She brings in all of her medications and these match what is in our system. The problem is that most of her medication bottles have dispense dates on them that were > 1 year ago. Admits that she struggles with adherence overall. She is requesting refills on her medication today.   Denies chest pain, dyspnea, HA, blurred vision.   Current BP Medications include:  amlodipine  10 mg daily, carvedilol  25 mg BID, valsartan -HCTZ 320-25 mg daily  Dietary habits include: Has been lowering their sodium intake. States they have been staying away from processed foods and like to follow a diabetic diet. Does not eat out frequently, tries to eat home-cooked meals as frequently as possible.   Exercise habits include: none.   Family / Social history:  Fhx: HLD, HTN, heart disease  Tobacco: 0.25 PPD smoker  Alcohol: 1 drink standard per today   O:   Vitals:   02/27/24 1505  BP: (!) 164/96  Pulse: 65     Last 3 Office BP readings: BP Readings from Last 3 Encounters:  02/27/24 (!) 164/96  02/18/24 132/77  01/13/24 (!) 157/97   BMET    Component Value Date/Time   NA 139 05/23/2023 1534   K 4.2 05/23/2023 1534   CL 98 05/23/2023 1534   CO2 26 05/23/2023 1534   GLUCOSE 93 05/23/2023 1534   GLUCOSE 116 (H) 08/11/2022 0117   BUN 21 05/23/2023 1534   CREATININE 1.30 (H) 05/23/2023 1534   CALCIUM  9.4 05/23/2023 1534   GFRNONAA 46 (L) 08/11/2022 0117   GFRAA 79 05/11/2019 1534    Clinical  ASCVD: PAD, CAD   A/P: Hypertension currently above goal (goal < 130/80 mmHg). Medication adherence reported but I suspect she does not take consistently based on her dispense report and her bottles on hand today. I strongly encouraged adherence.  -Continued current medication regimen. -Counseled on lifestyle modifications for blood pressure control including reduced dietary sodium, increased exercise, adequate sleep.  Total time in face-to-face counseling 30 minutes.    Follow-up with pharmacy in 6 weeks.   Herlene Fleeta Morris, PharmD, JAQUELINE, CPP Clinical Pharmacist [facility] [phone]    "

## 2024-02-28 ENCOUNTER — Ambulatory Visit: Payer: Self-pay | Admitting: Internal Medicine

## 2024-02-28 LAB — CBC
Hematocrit: 42.9 % (ref 34.0–46.6)
Hemoglobin: 14.7 g/dL (ref 11.1–15.9)
MCH: 33.1 pg — ABNORMAL HIGH (ref 26.6–33.0)
MCHC: 34.3 g/dL (ref 31.5–35.7)
MCV: 97 fL (ref 79–97)
Platelets: 205 10*3/uL (ref 150–450)
RBC: 4.44 x10E6/uL (ref 3.77–5.28)
RDW: 13.1 % (ref 11.7–15.4)
WBC: 5.8 10*3/uL (ref 3.4–10.8)

## 2024-02-28 LAB — IRON,TIBC AND FERRITIN PANEL
Ferritin: 95 ng/mL (ref 15–150)
Iron Saturation: 41 % (ref 15–55)
Iron: 113 ug/dL (ref 27–139)
Total Iron Binding Capacity: 273 ug/dL (ref 250–450)
UIBC: 160 ug/dL (ref 118–369)

## 2024-02-28 LAB — BASIC METABOLIC PANEL WITH GFR
BUN/Creatinine Ratio: 15 (ref 12–28)
BUN: 22 mg/dL (ref 8–27)
CO2: 24 mmol/L (ref 20–29)
Calcium: 9.9 mg/dL (ref 8.7–10.3)
Chloride: 98 mmol/L (ref 96–106)
Creatinine, Ser: 1.42 mg/dL — ABNORMAL HIGH (ref 0.57–1.00)
Glucose: 88 mg/dL (ref 70–99)
Potassium: 4.2 mmol/L (ref 3.5–5.2)
Sodium: 137 mmol/L (ref 134–144)
eGFR: 41 mL/min/{1.73_m2} — ABNORMAL LOW

## 2024-03-03 ENCOUNTER — Ambulatory Visit: Admitting: Nurse Practitioner

## 2024-03-03 ENCOUNTER — Encounter: Payer: Self-pay | Admitting: Nurse Practitioner

## 2024-03-03 ENCOUNTER — Ambulatory Visit: Payer: Self-pay

## 2024-03-03 DIAGNOSIS — G43109 Migraine with aura, not intractable, without status migrainosus: Secondary | ICD-10-CM

## 2024-03-03 MED ORDER — TOPIRAMATE 50 MG PO TABS: 50.0000 mg | ORAL_TABLET | Freq: Every day | ORAL | 1 refills | Status: AC

## 2024-03-03 NOTE — Progress Notes (Signed)
 Possible caffeine deficiency. Patient states that she is fine now.

## 2024-03-03 NOTE — Telephone Encounter (Signed)
 FYI Only or Action Required?: FYI only for provider: appointment scheduled on 2/3.  Patient was last seen in primary care on 02/18/2024 by Brooke Chapman, Brooke R, FNP.  Called Nurse Triage reporting Dizziness.  Symptoms began today.  Interventions attempted: Rest, hydration, or home remedies.  Symptoms are: gradually worsening.  Triage Disposition: See Physician Within 24 Hours  Patient/caregiver understands and will follow disposition?: Yes Message from [facility] C sent at 03/03/2024 12:03 PM EST  Summary: BP elevated   Reason for Triage: bp 179/   149/   left eye sensitive to light. Dizziness.         Reason for Disposition  [1] MODERATE dizziness (e.g., interferes with normal activities) AND [2] has NOT been evaluated by doctor (or NP/PA) for this  (Exception: Dizziness caused by heat exposure, sudden standing, or poor fluid intake.)  Answer Assessment - Initial Assessment Questions Woke this morning with feeling of impending headache- tried drinking coffee and stopped due to suddenly not tasting good. Ate something.  At work- had stressful start to work then bent down and when stood up straight- very light headed and had to sit. Hot flash where she immediately had to take layers off and then light sensitivity to the left eye. Moved to darker room. Unsure how long it lasted but boss drove her home after and she is ok now BP Right arm- 179/ ?, Left arm 149/?, Checked again and both arms 149/? BGL- 90- feels like she needs to eat every 2-3hrs   Denies CP, SOB, fever, unilateral weakness, loss of vision or double vision, speech changes or paresthesias.   Appt this afternoon to assess. ED/UC precautions reviewed  1. DESCRIPTION: Describe your dizziness.     Light headed 2. LIGHTHEADED: Do you feel lightheaded? (e.g., somewhat faint, woozy, weak upon standing)     Light headed  3. VERTIGO: Do you feel like either you or the room is spinning or tilting? (i.e., vertigo)      denies 4. SEVERITY: How bad is it?  Do you feel like you are going to faint? Can you stand and walk?     Felt like fainting- hot flash followed 5. ONSET:  When did the dizziness begin?     About an hour ago  6. AGGRAVATING FACTORS: Does anything make it worse? (e.g., standing, change in head position)     Bending over 8. CAUSE: What do you think is causing the dizziness? (e.g., decreased fluids or food, diarrhea, emotional distress, heat exposure, new medicine, sudden standing, vomiting; unknown)     Unsure-- needing to eat? 9. RECURRENT SYMPTOM: Have you had dizziness before? If Yes, ask: When was the last time? What happened that time?     denies 10. OTHER SYMPTOMS: Do you have any other symptoms? (e.g., fever, chest pain, vomiting, diarrhea, bleeding)       Light sensitive in left eye, hot flash, fever blister but no fever  Protocols used: Dizziness - Lightheadedness-A-AH

## 2024-04-09 ENCOUNTER — Ambulatory Visit: Payer: Self-pay | Admitting: Pharmacist

## 2024-04-16 ENCOUNTER — Ambulatory Visit: Payer: Self-pay | Admitting: Internal Medicine

## 2024-05-14 ENCOUNTER — Ambulatory Visit: Payer: Self-pay | Admitting: Internal Medicine
